# Patient Record
Sex: Male | Born: 1960 | Race: White | Hispanic: No | Marital: Single | Smoking: Never smoker
Health system: Southern US, Community
[De-identification: ages and names within clinical notes are randomized; demographics above are authoritative.]

## PROBLEM LIST (undated history)

## (undated) DIAGNOSIS — K5792 Diverticulitis of intestine, part unspecified, without perforation or abscess without bleeding: Secondary | ICD-10-CM

## (undated) DIAGNOSIS — M5430 Sciatica, unspecified side: Secondary | ICD-10-CM

## (undated) DIAGNOSIS — F319 Bipolar disorder, unspecified: Secondary | ICD-10-CM

## (undated) DIAGNOSIS — F32A Depression, unspecified: Secondary | ICD-10-CM

## (undated) DIAGNOSIS — M549 Dorsalgia, unspecified: Secondary | ICD-10-CM

## (undated) DIAGNOSIS — K219 Gastro-esophageal reflux disease without esophagitis: Secondary | ICD-10-CM

## (undated) DIAGNOSIS — G8929 Other chronic pain: Secondary | ICD-10-CM

## (undated) DIAGNOSIS — F329 Major depressive disorder, single episode, unspecified: Secondary | ICD-10-CM

## (undated) HISTORY — PX: PILONIDAL CYST DRAINAGE: SHX743

## (undated) HISTORY — PX: SHOULDER SURGERY: SHX246

---

## 2014-04-15 ENCOUNTER — Encounter (HOSPITAL_COMMUNITY): Payer: Self-pay | Admitting: Emergency Medicine

## 2014-04-15 ENCOUNTER — Emergency Department (HOSPITAL_COMMUNITY)
Admission: EM | Admit: 2014-04-15 | Discharge: 2014-04-15 | Disposition: A | Discharge status: Home / Self Care | Payer: Self-pay | Attending: Emergency Medicine | Admitting: Emergency Medicine

## 2014-04-15 ENCOUNTER — Emergency Department (HOSPITAL_COMMUNITY): Payer: Self-pay

## 2014-04-15 DIAGNOSIS — M5430 Sciatica, unspecified side: Secondary | ICD-10-CM | POA: Insufficient documentation

## 2014-04-15 DIAGNOSIS — Z79899 Other long term (current) drug therapy: Secondary | ICD-10-CM | POA: Insufficient documentation

## 2014-04-15 DIAGNOSIS — R410 Disorientation, unspecified: Secondary | ICD-10-CM | POA: Insufficient documentation

## 2014-04-15 DIAGNOSIS — Z791 Long term (current) use of non-steroidal anti-inflammatories (NSAID): Secondary | ICD-10-CM | POA: Insufficient documentation

## 2014-04-15 DIAGNOSIS — I10 Essential (primary) hypertension: Secondary | ICD-10-CM | POA: Insufficient documentation

## 2014-04-15 HISTORY — DX: Bipolar disorder, unspecified: F31.9

## 2014-04-15 HISTORY — DX: Depression, unspecified: F32.A

## 2014-04-15 HISTORY — DX: Sciatica, unspecified side: M54.30

## 2014-04-15 HISTORY — DX: Major depressive disorder, single episode, unspecified: F32.9

## 2014-04-15 LAB — CBC WITH DIFFERENTIAL/PLATELET
Basophils Absolute: 0 10*3/uL (ref 0.0–0.1)
Basophils Relative: 0 % (ref 0–1)
Eosinophils Absolute: 0.2 10*3/uL (ref 0.0–0.7)
Eosinophils Relative: 4 % (ref 0–5)
HCT: 41.1 % (ref 39.0–52.0)
Hemoglobin: 13.3 g/dL (ref 13.0–17.0)
Lymphocytes Relative: 31 % (ref 12–46)
Lymphs Abs: 1.7 10*3/uL (ref 0.7–4.0)
MCH: 29.8 pg (ref 26.0–34.0)
MCHC: 32.4 g/dL (ref 30.0–36.0)
MCV: 92.2 fL (ref 78.0–100.0)
Monocytes Absolute: 0.8 10*3/uL (ref 0.1–1.0)
Monocytes Relative: 15 % — ABNORMAL HIGH (ref 3–12)
Neutro Abs: 2.7 10*3/uL (ref 1.7–7.7)
Neutrophils Relative %: 50 % (ref 43–77)
Platelets: 235 10*3/uL (ref 150–400)
RBC: 4.46 MIL/uL (ref 4.22–5.81)
RDW: 13.8 % (ref 11.5–15.5)
WBC: 5.4 10*3/uL (ref 4.0–10.5)

## 2014-04-15 LAB — COMPREHENSIVE METABOLIC PANEL
ALT: 32 U/L (ref 0–53)
AST: 37 U/L (ref 0–37)
Albumin: 3.3 g/dL — ABNORMAL LOW (ref 3.5–5.2)
Alkaline Phosphatase: 58 U/L (ref 39–117)
Anion gap: 6 (ref 5–15)
BUN: 13 mg/dL (ref 6–23)
CO2: 27 mmol/L (ref 19–32)
Calcium: 9.4 mg/dL (ref 8.4–10.5)
Chloride: 106 mEq/L (ref 96–112)
Creatinine, Ser: 1.11 mg/dL (ref 0.50–1.35)
GFR calc Af Amer: 85 mL/min — ABNORMAL LOW (ref >90)
GFR calc non Af Amer: 74 mL/min — ABNORMAL LOW (ref >90)
Glucose, Bld: 79 mg/dL (ref 70–99)
Potassium: 4 mmol/L (ref 3.5–5.1)
Sodium: 139 mmol/L (ref 135–145)
Total Bilirubin: 0.3 mg/dL (ref 0.3–1.2)
Total Protein: 6.5 g/dL (ref 6.0–8.3)

## 2014-04-15 MED ORDER — IBUPROFEN 800 MG PO TABS
800.0000 mg | ORAL_TABLET | Freq: Once | ORAL | Status: AC
  Administered 2014-04-15: 800 mg via ORAL
  Filled 2014-04-15: qty 1

## 2014-04-15 MED ORDER — OMEPRAZOLE 20 MG PO CPDR: 20.0000 mg | DELAYED_RELEASE_CAPSULE | Freq: Every day | ORAL | Status: DC

## 2014-04-15 MED ORDER — NAPROXEN 500 MG PO TABS: 500.0000 mg | ORAL_TABLET | Freq: Two times a day (BID) | ORAL | Status: DC

## 2014-04-15 MED ORDER — LISINOPRIL 5 MG PO TABS: 5.0000 mg | ORAL_TABLET | Freq: Every day | ORAL | Status: DC

## 2014-04-15 NOTE — ED Notes (Signed)
Pt A&OX4, ambulatory at d/c with steady gait, NAD 

## 2014-04-15 NOTE — ED Provider Notes (Signed)
CSN: [id number]     Arrival date & time 04/15/14  1344 History   First MD Initiated Contact with Patient 04/15/14 1714     Chief Complaint  Patient presents with  . Hypertension     (Consider location/radiation/quality/duration/timing/severity/associated sxs/prior Treatment) Patient is a 54 y.o. male presenting with altered mental status. The history is provided by the patient. No language interpreter was used.  Altered Mental Status Presenting symptoms: confusion   Severity:  Moderate Most recent episode:  More than 2 days ago Timing:  Constant Progression:  Worsening Chronicity:  New Context: not taking medications as prescribed   Associated symptoms: no abdominal pain and no vomiting   Pt's Mother reports pt has had episodes of confusion.   Pt is forgetful and has trouble talking sometimes.  She reports pt had an episode 4 days ago of increased confusion.  Pt and Mother are concerned about his blood pressure.  Pt also has a tremor which is worsening.  (pt has had for 20 years) Pt is out of his Medication.   Pt was released from prison in November.   Past Medical History  Diagnosis Date  . Hypertension   . Bipolar disorder   . Depression   . Sciatica    History reviewed. No pertinent past surgical history. History reviewed. No pertinent family history. History  Substance Use Topics  . Smoking status: Never Smoker   . Smokeless tobacco: Not on file  . Alcohol Use: No    Review of Systems  Gastrointestinal: Negative for vomiting and abdominal pain.  Psychiatric/Behavioral: Positive for confusion.  All other systems reviewed and are negative.     Allergies  Review of patient's allergies indicates no known allergies.  Home Medications   Prior to Admission medications   Medication Sig Start Date End Date Taking? Authorizing Provider  amitriptyline (ELAVIL) 100 MG tablet Take 100 mg by mouth at bedtime. 03/24/14  Yes Historical Provider, MD  divalproex (DEPAKOTE ER)  500 MG 24 hr tablet Take 1,500 mg by mouth daily.   Yes Historical Provider, MD  escitalopram (LEXAPRO) 20 MG tablet Take 20 mg by mouth daily.   Yes Historical Provider, MD  lisinopril (PRINIVIL,ZESTRIL) 5 MG tablet Take 5 mg by mouth daily.   Yes Historical Provider, MD  naproxen (NAPROSYN) 500 MG tablet Take 500 mg by mouth 2 (two) times daily with a meal.   Yes Historical Provider, MD  omeprazole (PRILOSEC) 20 MG capsule Take 20 mg by mouth daily.   Yes Historical Provider, MD   BP 131/83 mmHg  Pulse 102  Temp(Src) 97.8 F (36.6 C) (Oral)  Resp 12  SpO2 97% Physical Exam  Constitutional: He is oriented to person, place, and time. He appears well-developed and well-nourished.  HENT:  Head: Normocephalic and atraumatic.  Right Ear: External ear normal.  Left Ear: External ear normal.  Nose: Nose normal.  Mouth/Throat: Oropharynx is clear and moist.  Eyes: EOM are normal. Pupils are equal, round, and reactive to light.  Neck: Normal range of motion.  Cardiovascular: Normal rate and normal heart sounds.   Pulmonary/Chest: Effort normal.  Abdominal: He exhibits no distension.  Musculoskeletal: Normal range of motion.  Neurological: He is alert and oriented to person, place, and time. He has normal reflexes. He displays normal reflexes. No cranial nerve deficit. Coordination normal.  Psychiatric: He has a normal mood and affect.  Nursing note and vitals reviewed.   ED Course  Procedures (including critical care time) Labs Review Labs Reviewed  CBC WITH DIFFERENTIAL - Abnormal; Notable for the following:    Monocytes Relative 15 (*)    All other components within normal limits  COMPREHENSIVE METABOLIC PANEL - Abnormal; Notable for the following:    Albumin 3.3 (*)    GFR calc non Af Amer 74 (*)    GFR calc Af Amer 85 (*)    All other components within normal limits  URINALYSIS, ROUTINE W REFLEX MICROSCOPIC    Imaging Review Ct Head Wo Contrast  04/15/2014   CLINICAL DATA:   Initial evaluation for confusion, history of bipolar disorder  EXAM: CT HEAD WITHOUT CONTRAST  TECHNIQUE: Contiguous axial images were obtained from the base of the skull through the vertex without intravenous contrast.  COMPARISON:  None.  FINDINGS: Moderate diffuse atrophy. Normal attenuation with no evidence of mass hemorrhage infarct or extra-axial fluid. No hydrocephalus. Calvarium intact. No significant inflammatory change in the visualized portions of the sinuses.  IMPRESSION: Atrophy with no acute finding   Electronically Signed   By: Esperanza Heir M.D.   On: 04/15/2014 19:47     EKG Interpretation   Date/Time:  Sunday April 15 2014 18:16:10 EST Ventricular Rate:  95 PR Interval:  156 QRS Duration: 93 QT Interval:  381 QTC Calculation: 479 R Axis:   72 Text Interpretation:  Sinus rhythm Confirmed by ZAVITZ  MD, JOSHUA ([id number])  on 04/15/2014 7:18:29 PM      MDM Labs and ct head are obtained.  I will give rx for pts naprosyn, prilosec and lisinopril.   Pt is given resource guide referrals Pt advised he needs to go to mental health and find a primary care MD   Final diagnoses:  Confusion        Elson Areas, PA-C 04/15/14 2209  Lonia Skinner [place], PA-C 04/15/14 2324  Enid Skeens, MD 04/16/14 0002

## 2014-04-15 NOTE — ED Notes (Signed)
Patient transported to CT 

## 2014-04-15 NOTE — Discharge Instructions (Signed)
Confusion Confusion is the inability to think with your usual speed or clarity. Confusion may come on quickly or slowly over time. How quickly the confusion comes on depends on the cause. Confusion can be due to any number of causes. CAUSES   Concussion, head injury, or head trauma.  Seizures.  Stroke.  Fever.  Brain tumor.  Age related decreased brain function (dementia).  Heightened emotional states like rage or terror.  Mental illness in which the person loses the ability to determine what is real and what is not (hallucinations).  Infections such as a urinary tract infection (UTI).  Toxic effects from alcohol, drugs, or prescription medicines.  Dehydration and an imbalance of salts in the body (electrolytes).  Lack of sleep.  Low blood sugar (diabetes).  Low levels of oxygen from conditions such as chronic lung disorders.  Drug interactions or other medicine side effects.  Nutritional deficiencies, especially niacin, thiamine, vitamin C, or vitamin B.  Sudden drop in body temperature (hypothermia).  Change in routine, such as when traveling or hospitalized. SIGNS AND SYMPTOMS  People often describe their thinking as cloudy or unclear when they are confused. Confusion can also include feeling disoriented. That means you are unaware of where or who you are. You may also not know what the date or time is. If confused, you may also have difficulty paying attention, remembering, and making decisions. Some people also act aggressively when they are confused.  DIAGNOSIS  The medical evaluation of confusion may include:  Blood and urine tests.  X-rays.  Brain and nervous system tests.  Analyzing your brain waves (electroencephalogram or EEG).  Magnetic resonance imaging (MRI) of your head.  Computed tomography (CT) scan of your head.  Mental status tests in which your health care provider may ask many questions. Some of these questions may seem silly or strange,  but they are a very important test to help diagnose and treat confusion. TREATMENT  An admission to the hospital may not be needed, but a person with confusion should not be left alone. Stay with a family member or friend until the confusion clears. Avoid alcohol, pain relievers, or sedative drugs until you have fully recovered. Do not drive until directed by your health care provider. HOME CARE INSTRUCTIONS  What family and friends can do:  To find out if someone is confused, ask the person to state his or her name, age, and the date. If the person is unsure or answers incorrectly, he or she is confused.  Always introduce yourself, no matter how well the person knows you.  Often remind the person of his or her location.  Place a calendar and clock near the confused person.  Help the person with his or her medicines. You may want to use a pill box, an alarm as a reminder, or give the person each dose as prescribed.  Talk about current events and plans for the day.  Try to keep the environment calm, quiet, and peaceful.  Make sure the person keeps follow-up visits with his or her health care provider. PREVENTION  Ways to prevent confusion:  Avoid alcohol.  Eat a balanced diet.  Get enough sleep.  Take medicine only as directed by your health care provider.  Do not become isolated. Spend time with other people and make plans for your days.  Keep careful watch on your blood sugar levels if you are diabetic. SEEK IMMEDIATE MEDICAL CARE IF:   You develop severe headaches, repeated vomiting, seizures, blackouts, or  slurred speech.  There is increasing confusion, weakness, numbness, restlessness, or personality changes.  You develop a loss of balance, have marked dizziness, feel uncoordinated, or fall.  You have delusions, hallucinations, or develop severe anxiety.  Your family members think you need to be rechecked. Document Released: 05/07/2004 Document Revised: 08/14/2013  Document Reviewed: 05/05/2013 [facility] Patient Information 2015 [place], [place]. This information is not intended to replace advice given to you by your health care provider. Make sure you discuss any questions you have with your health care provider. Hypertension Hypertension, commonly called high blood pressure, is when the force of blood pumping through your arteries is too strong. Your arteries are the blood vessels that carry blood from your heart throughout your body. A blood pressure reading consists of a higher number over a lower number, such as 110/72. The higher number (systolic) is the pressure inside your arteries when your heart pumps. The lower number (diastolic) is the pressure inside your arteries when your heart relaxes. Ideally you want your blood pressure below 120/80. Hypertension forces your heart to work harder to pump blood. Your arteries may become narrow or stiff. Having hypertension puts you at risk for heart disease, stroke, and other problems.  RISK FACTORS Some risk factors for high blood pressure are controllable. Others are not.  Risk factors you cannot control include:   Race. You may be at higher risk if you are African American.  Age. Risk increases with age.  Gender. Men are at higher risk than women before age 39 years. After age 29, women are at higher risk than men. Risk factors you can control include:  Not getting enough exercise or physical activity.  Being overweight.  Getting too much fat, sugar, calories, or salt in your diet.  Drinking too much alcohol. SIGNS AND SYMPTOMS Hypertension does not usually cause signs or symptoms. Extremely high blood pressure (hypertensive crisis) may cause headache, anxiety, shortness of breath, and nosebleed. DIAGNOSIS  To check if you have hypertension, your health care provider will measure your blood pressure while you are seated, with your arm held at the level of your heart. It should be measured at least  twice using the same arm. Certain conditions can cause a difference in blood pressure between your right and left arms. A blood pressure reading that is higher than normal on one occasion does not mean that you need treatment. If one blood pressure reading is high, ask your health care provider about having it checked again. TREATMENT  Treating high blood pressure includes making lifestyle changes and possibly taking medicine. Living a healthy lifestyle can help lower high blood pressure. You may need to change some of your habits. Lifestyle changes may include:  Following the DASH diet. This diet is high in fruits, vegetables, and whole grains. It is low in salt, red meat, and added sugars.  Getting at least 2 hours of brisk physical activity every week.  Losing weight if necessary.  Not smoking.  Limiting alcoholic beverages.  Learning ways to reduce stress. If lifestyle changes are not enough to get your blood pressure under control, your health care provider may prescribe medicine. You may need to take more than one. Work closely with your health care provider to understand the risks and benefits. HOME CARE INSTRUCTIONS  Have your blood pressure rechecked as directed by your health care provider.   Take medicines only as directed by your health care provider. Follow the directions carefully. Blood pressure medicines must be taken as prescribed.  The medicine does not work as well when you skip doses. Skipping doses also puts you at risk for problems.   Do not smoke.   Monitor your blood pressure at home as directed by your health care provider. SEEK MEDICAL CARE IF:   You think you are having a reaction to medicines taken.  You have recurrent headaches or feel dizzy.  You have swelling in your ankles.  You have trouble with your vision. SEEK IMMEDIATE MEDICAL CARE IF:  You develop a severe headache or confusion.  You have unusual weakness, numbness, or feel  faint.  You have severe chest or abdominal pain.  You vomit repeatedly.  You have trouble breathing. MAKE SURE YOU:   Understand these instructions.  Will watch your condition.  Will get help right away if you are not doing well or get worse. Document Released: 03/30/2005 Document Revised: 08/14/2013 Document Reviewed: 01/20/2013 [facility] Patient Information 2015 [place], [place]. This information is not intended to replace advice given to you by your health care provider. Make sure you discuss any questions you have with your health care provider.  Emergency Department Resource Guide 1) Find a Doctor and Pay Out of Pocket Although you won't have to find out who is covered by your insurance plan, it is a good idea to ask around and get recommendations. You will then need to call the office and see if the doctor you have chosen will accept you as a new patient and what types of options they offer for patients who are self-pay. Some doctors offer discounts or will set up payment plans for their patients who do not have insurance, but you will need to ask so you aren't surprised when you get to your appointment.  2) Contact Your Local Health Department Not all health departments have doctors that can see patients for sick visits, but many do, so it is worth a call to see if yours does. If you don't know where your local health department is, you can check in your phone book. The CDC also has a tool to help you locate your state's health department, and many state websites also have listings of all of their local health departments.  3) Find a Walk-in Clinic If your illness is not likely to be very severe or complicated, you may want to try a walk in clinic. These are popping up all over the country in pharmacies, drugstores, and shopping centers. They're usually staffed by nurse practitioners or physician assistants that have been trained to treat common illnesses and complaints. They're usually  fairly quick and inexpensive. However, if you have serious medical issues or chronic medical problems, these are probably not your best option.  No Primary Care Doctor: - Call Health Connect at  [phone] - they can help you locate a primary care doctor that  accepts your insurance, provides certain services, etc. - Physician Referral Service- [phone]  Chronic Pain Problems: Organization         Address  Phone   Notes  [facility]  [phone] Patients need to be referred by their primary care doctor.   Medication Assistance: Organization         Address  Phone   Notes  [facility] Medication [facility] [address] [place] [place], [place] [address] [phone] --Must be a resident of [facility] -- Must have NO insurance coverage whatsoever (no Medicaid/ Medicare, etc.) -- The pt. MUST have a primary care doctor that  directs their care regularly and follows them in the community   [facility]  [phone]   [employer]  [phone]    Agencies that provide inexpensive medical care: Organization         Address  Phone   Notes  [facility]  [phone]   [facility]    [phone]   [facility] [address] [place], [place] [address] [phone]   [facility] [address] [place], [place] [phone]   Planned Parenthood    [phone]   [facility]    [phone]   [facility]  [address], [place] Phone:  [phone], Fax:  [phone] Hours of Operation:  9 am - 6 pm, M-F.  Also accepts Medicaid/Medicare and self-pay.  [facility]  [address], [place] Phone: [phone], Fax: [phone]. Hours of Operation:  8:30 am - 5:30 pm, M-F.  Also accepts Medicaid and self-pay.  [facility] [place] [address] [place] Phone: [phone]   [facility] [address] Natasha Bence [place], [place] [phone], Ext. [phone] Mondays & Thursdays: 7-9 AM.  First 15 patients are seen on a first come, first serve basis.    Medicaid-accepting [facility] Providers:  Organization         Address  Phone   Notes  [facility] [address], [place] [phone] Also accepts self-pay patients.  [facility] [address] Laurell Josephs [place], [place]  [phone]   [facility] [address], [place] [phone]   [facility] [address] [place] [phone]   Renaye Rakers [address], [place]   [phone] Only accepts [place] Access [place] patients after they have their name applied to their card.   Self-Pay (no insurance) in [facility]:  Organization         Address  Phone   Notes  Sickle Cell Patients, [facility] Internal Medicine [address] [place], [place] [phone]   [facility] Urgent Care [address] [place], [place] [phone]   [facility]  [address], [place] [phone]   [facility]/Dr. Osei-Bonsu  [address] [place] or [address], [place] [phone] Phone number for both [place] and [place] locations is the same.  [facility] [address] [place] [phone]   [facility] [address], [place] or [address] [phone]   [facility] [place] [facility] [address], [place] [phone], phone; [phone], fax Sees patients 1st and 3rd Saturday of every month.  Must not qualify for public or private insurance (i.e. Medicaid, Medicare, [place] Health Choice, Veterans' Benefits)  Household income should be no more than 200% of the poverty level The clinic cannot treat you if  you are pregnant or think you are pregnant  Sexually transmitted diseases are not treated at the clinic.    Dental Care: Organization         Address  Phone  Notes  [facility] Department of [facility] [address] [place], [place] [phone] Accepts children up  to age 95 who are enrolled in Medicaid or [place] Health Choice; pregnant women with a Medicaid card; and children who have applied for Medicaid or [place] Health Choice, but were declined, whose parents can pay a reduced fee at time of service.  [facility] Department of [facility]  [address] [place] [phone] Accepts children up to age 59 who are enrolled in [place] or [place] Health Choice; pregnant women with a Medicaid card; and children who have applied for Medicaid or [place] Health Choice, but were declined, whose parents can pay a reduced fee at time of service.  [facility]  [address] [place], [place] [phone] Patients are seen by appointment only. Walk-ins are not accepted. [facility] will see patients 62 years of age and older. Monday - Tuesday (8am-5pm) Most Wednesdays (8:30-5pm) $30 per visit, cash only  [facility]  [address], [facility] [phone] Patients are seen by appointment only. Walk-ins are not accepted. [facility] will see patients 64 years of age and older. One Wednesday Evening (Monthly: Volunteer Based).  $30 per visit, cash only  [employer]  [phone] for adults; Children under age 77, call Graduate Pediatric Dentistry at [phone]. Children aged 41-14, please call [phone] to request a pediatric application.  Dental services are provided in all areas of dental care including fillings, crowns and bridges, complete and partial dentures, implants, gum treatment, root canals, and extractions. Preventive care is also provided. Treatment is  provided to both adults and children. Patients are selected via a lottery and there is often a waiting list.   [facility] [address], [place]  [phone] [email or web address]   [facility] [address] [place], [place] [phone], Ext. [phone] Second and Fourth Thursday of each month, opens at 6:30 AM; Clinic ends at 9 AM.  Patients are seen on a first-come first-served basis, and a limited number are seen during each clinic.   [facility]  [address] Ether Griffins [place], [place] [phone]   Eligibility Requirements You must have lived in [place], [place], or [place] counties for at least the last three months.   You cannot be eligible for state or federal sponsored [place], including [employer], [place], or [employer].   You generally cannot be eligible for healthcare insurance through your employer.    How to apply: Eligibility screenings are held every Tuesday and Wednesday afternoon from 1:00 pm until 4:00 pm. You do not need an appointment for the interview!  [facility] [address] [place], [place] [phone]   [facility] Health Department  [phone]   [facility]  [phone]   [facility]  [phone]    Behavioral Health Resources in the Community: Intensive Outpatient Programs Organization         Address  Phone  Notes  [facility] Services [address] [place], [place] [phone]   [facility] Outpatient [address] [place], [place] [phone]   [facility] [address], [place], [place]  [phone]   [facility] Mental Health [address]  [place], [place] [phone] or [phone]   Substance Abuse Resources Organization         Address  Phone  Notes  [facility] Drug Services  [phone]   [facility]  [phone]   [facility]  [place]  [phone]   Floydene Flock  [phone]   Residential & Outpatient Substance Abuse Program  [phone]   Psychological Services Organization         Address  Phone  Notes  [facility]  [phone]   [facility] Services  [phone]   [facility] Mental Health [address] [place] [phone] or [phone]    Mobile Crisis Teams Organization         Address  Phone  Notes  [facility], Mobile Crisis Care Unit  [phone]   [facility]  [address] [place], [place] [phone]   Doristine Locks [address] [place] [phone]    Self-Help/Support Groups Organization         Address  Phone             Notes  Mental Health Assoc. of  - variety of support groups  336- I7437963 Call for more information  Narcotics Anonymous (NA), [facility] [address], [employer] [place]  2 meetings at this location   Statistician         Address  Phone  Notes  [facility] [address],    [place]  [phone]   [facility]  [address] [place] [address], [place], [place] [phone]   [facility] [address] [place], [place] [phone] Admissions: 8am-3pm M-F  [facility] [address]    [place], [place] [phone]   [facility] [address] [place], [place], [place] [phone]   [facility] [address],  [place], [place] [phone]   [facility] - Intensive Outpatient [address] Laurell Joseph[address], [place], [place] [phone]   [facility] (Addiction Recovery [facility] [address] [place]  [place], [place] [phone] or [phone]   [facility] ([facility]) [address] [place] [address], [place], [place] [phone] Accepts Medicaid  [facility] [place] [address],  [place] [phone] Substance Abuse/Addiction Treatment    [facility] Organization         Address  Phone  Notes  [facility]  [phone]   Angie Fava, PhD [address] Ervin Knack [place], [place]   [phone] or [phone]   [facility] Behavioral   [address] [place], [place] [phone]   [facility] [address] [place], [place] [phone] Insurance/Medicaid/sponsorship through [facility] [address]                                    [place], [place] [phone] Therapy/tele-psych/case  [facility] [address], [place] [phone]    Dr. Lolly Mustache  [phone]   [facility] [place]  [employer] [facility] Dept. 1) [address], [place] 2) [address] [place] 3)  [address], [place] [phone]  [phone]   [facility] Child Abuse Hotline 316-347-0503 or [phone] (After Hours)

## 2014-04-15 NOTE — ED Notes (Signed)
Pt c/o htn and some slurred speech Wednesday night with AMS that is now resolved

## 2014-05-08 ENCOUNTER — Emergency Department (HOSPITAL_COMMUNITY): Payer: Self-pay

## 2014-05-08 ENCOUNTER — Ambulatory Visit: Payer: Self-pay

## 2014-05-08 ENCOUNTER — Encounter (HOSPITAL_COMMUNITY): Payer: Self-pay

## 2014-05-08 ENCOUNTER — Emergency Department (HOSPITAL_COMMUNITY)
Admission: EM | Admit: 2014-05-08 | Discharge: 2014-05-08 | Disposition: A | Discharge status: Home / Self Care | Payer: Self-pay | Attending: Emergency Medicine | Admitting: Emergency Medicine

## 2014-05-08 DIAGNOSIS — Z791 Long term (current) use of non-steroidal anti-inflammatories (NSAID): Secondary | ICD-10-CM | POA: Insufficient documentation

## 2014-05-08 DIAGNOSIS — I10 Essential (primary) hypertension: Secondary | ICD-10-CM | POA: Insufficient documentation

## 2014-05-08 DIAGNOSIS — Z8739 Personal history of other diseases of the musculoskeletal system and connective tissue: Secondary | ICD-10-CM | POA: Insufficient documentation

## 2014-05-08 DIAGNOSIS — W19XXXA Unspecified fall, initial encounter: Secondary | ICD-10-CM

## 2014-05-08 DIAGNOSIS — Y9389 Activity, other specified: Secondary | ICD-10-CM | POA: Insufficient documentation

## 2014-05-08 DIAGNOSIS — Y998 Other external cause status: Secondary | ICD-10-CM | POA: Insufficient documentation

## 2014-05-08 DIAGNOSIS — F319 Bipolar disorder, unspecified: Secondary | ICD-10-CM | POA: Insufficient documentation

## 2014-05-08 DIAGNOSIS — W1830XA Fall on same level, unspecified, initial encounter: Secondary | ICD-10-CM | POA: Insufficient documentation

## 2014-05-08 DIAGNOSIS — S3992XA Unspecified injury of lower back, initial encounter: Secondary | ICD-10-CM | POA: Insufficient documentation

## 2014-05-08 DIAGNOSIS — Y9289 Other specified places as the place of occurrence of the external cause: Secondary | ICD-10-CM | POA: Insufficient documentation

## 2014-05-08 DIAGNOSIS — M549 Dorsalgia, unspecified: Secondary | ICD-10-CM

## 2014-05-08 DIAGNOSIS — Z79899 Other long term (current) drug therapy: Secondary | ICD-10-CM | POA: Insufficient documentation

## 2014-05-08 MED ORDER — PROPRANOLOL HCL ER 60 MG PO CP24: 60.0000 mg | ORAL_CAPSULE | Freq: Every day | ORAL | Status: DC

## 2014-05-08 MED ORDER — ESCITALOPRAM OXALATE 20 MG PO TABS: 20.0000 mg | ORAL_TABLET | Freq: Every day | ORAL | Status: DC

## 2014-05-08 MED ORDER — NAPROXEN 500 MG PO TABS: 500.0000 mg | ORAL_TABLET | Freq: Two times a day (BID) | ORAL | Status: DC

## 2014-05-08 MED ORDER — AMITRIPTYLINE HCL 100 MG PO TABS: 100.0000 mg | ORAL_TABLET | Freq: Every day | ORAL | Status: DC

## 2014-05-08 MED ORDER — LISINOPRIL 5 MG PO TABS: 5.0000 mg | ORAL_TABLET | Freq: Every day | ORAL | Status: DC

## 2014-05-08 MED ORDER — DIVALPROEX SODIUM ER 500 MG PO TB24
1500.0000 mg | ORAL_TABLET | Freq: Every day | ORAL | Status: DC
Start: 2014-05-08 — End: 2015-05-30

## 2014-05-08 MED ORDER — PREDNISONE 20 MG PO TABS: 40.0000 mg | ORAL_TABLET | Freq: Every day | ORAL | Status: DC

## 2014-05-08 NOTE — ED Notes (Signed)
Pt reports having back problems x 2 years.  Has been taking Elavil, Naproxen and Propanolol, has been out of meds x 2 1/2 weeks.  Has PCP appt 3-2, is unable to wait that long.  Pain is left lower back radiating down buttock to top of posterior leg.  Pt having difficulties when getting out of bed in the mornings, has to use cane or crutches.  Pt not sleeping at night.

## 2014-05-08 NOTE — Discharge Instructions (Signed)
Back Pain, Adult °Low back pain is very common. About 1 in 5 people have back pain. The cause of low back pain is rarely dangerous. The pain often gets better over time. About half of people with a sudden onset of back pain feel better in just 2 weeks. About 8 in 10 people feel better by 6 weeks.  °CAUSES °Some common causes of back pain include: °· Strain of the muscles or ligaments supporting the spine. °· Wear and tear (degeneration) of the spinal discs. °· Arthritis. °· Direct injury to the back. °DIAGNOSIS °Most of the time, the direct cause of low back pain is not known. However, back pain can be treated effectively even when the exact cause of the pain is unknown. Answering your caregiver's questions about your overall health and symptoms is one of the most accurate ways to make sure the cause of your pain is not dangerous. If your caregiver needs more information, he or she may order lab work or imaging tests (X-rays or MRIs). However, even if imaging tests show changes in your back, this usually does not require surgery. °HOME CARE INSTRUCTIONS °For many people, back pain returns. Since low back pain is rarely dangerous, it is often a condition that people can learn to manage on their own.  °· Remain active. It is stressful on the back to sit or stand in one place. Do not sit, drive, or stand in one place for more than 30 minutes at a time. Take short walks on level surfaces as soon as pain allows. Try to increase the length of time you walk each day. °· Do not stay in bed. Resting more than 1 or 2 days can delay your recovery. °· Do not avoid exercise or work. Your body is made to move. It is not dangerous to be active, even though your back may hurt. Your back will likely heal faster if you return to being active before your pain is gone. °· Pay attention to your body when you  bend and lift. Many people have less discomfort when lifting if they bend their knees, keep the load close to their bodies, and  avoid twisting. Often, the most comfortable positions are those that put less stress on your recovering back. °· Find a comfortable position to sleep. Use a firm mattress and lie on your side with your knees slightly bent. If you lie on your back, put a pillow under your knees. °· Only take over-the-counter or prescription medicines as directed by your caregiver. Over-the-counter medicines to reduce pain and inflammation are often the most helpful. Your caregiver may prescribe muscle relaxant drugs. These medicines help dull your pain so you can more quickly return to your normal activities and healthy exercise. °· Put ice on the injured area. °¨ Put ice in a plastic bag. °¨ Place a towel between your skin and the bag. °¨ Leave the ice on for 15-20 minutes, 03-04 times a day for the first 2 to 3 days. After that, ice and heat may be alternated to reduce pain and spasms. °· Ask your caregiver about trying back exercises and gentle massage. This may be of some benefit. °· Avoid feeling anxious or stressed. Stress increases muscle tension and can worsen back pain. It is important to recognize when you are anxious or stressed and learn ways to manage it. Exercise is a great option. °SEEK MEDICAL CARE IF: °· You have pain that is not relieved with rest or medicine. °· You have pain that does not improve in 1 week. °· You have new symptoms. °· You are generally not feeling well. °SEEK   IMMEDIATE MEDICAL CARE IF:  °· You have pain that radiates from your back into your legs. °· You develop new bowel or bladder control problems. °· You have unusual weakness or numbness in your arms or legs. °· You develop nausea or vomiting. °· You develop abdominal pain. °· You feel faint. °Document Released: 03/30/2005 Document Revised: 09/29/2011 Document Reviewed: 08/01/2013 °Exit[employer]® Patient Information ©2015 [employer]. This information is not intended to replace advice given to you by your health care provider. Make sure you  discuss any questions you have with your health care provider. ° °Medication Refill, Emergency Department °We have refilled your medication today as a courtesy to you. It is best for your medical care, however, to take care of getting refills done through your primary caregiver's office. They have your records and can do a better job of follow-up than we can in the emergency department. °On maintenance medications, we often only prescribe enough medications to get you by until you are able to see your regular caregiver. This is a more expensive way to refill medications. °In the future, please plan for refills so that you will not have to use the emergency department for this. °Thank you for your help. Your help allows us to better take care of the daily emergencies that enter our department. °Document Released: 07/17/2003 Document Revised: 06/22/2011 Document Reviewed: 07/07/2013 °Exit[employer]® Patient Information ©2015 [employer]. This information is not intended to replace advice given to you by your health care provider. Make sure you discuss any questions you have with your health care provider. ° °

## 2014-05-08 NOTE — ED Provider Notes (Signed)
CSN: [id number]     Arrival date & time 05/08/14  1535 History   First MD Initiated Contact with Patient 05/08/14 1612     This chart was scribed for non-physician practitioner, Victor Horseman PA-C working with Victor Sheffield, MD by Arlan Organ, ED Scribe. This patient was seen in room TR10C/TR10C and the patient's care was started at 5:05 PM.   Chief Complaint  Patient presents with  . Back Pain   HPI  HPI Comments: Victor Long is a 54 y.o. male with a PMHx of degenetive disc disease, chronic back pain and sciatica who presents to the Emergency Department complaining of constant, moderate L hip pain that radiates to L mid calf x 1.5 weeks that has progressively worsened. Pain is worsened with abduction of hip. Pt states he sustained a fall 3 days ago landing on his bottock which he feels has exacerbated ongoing chronic back pain. He is able to ambulate but states he "hobbles". However, pt is exacerbated when attempting to ambulate and when sitting properly in a chair. He has tried Naproxen without any improvement for symptoms. Victor Long has recent ran out of prescription pain medications 2 weeks ago. No bowel or bladder incontinence. No known allergies to medications.   Past Medical History  Diagnosis Date  . Hypertension   . Bipolar disorder   . Depression   . Sciatica    History reviewed. No pertinent past surgical history. History reviewed. No pertinent family history. History  Substance Use Topics  . Smoking status: Never Smoker   . Smokeless tobacco: Not on file  . Alcohol Use: No    Review of Systems  Constitutional: Negative for fever and chills.  Respiratory: Negative for shortness of breath.   Cardiovascular: Negative for chest pain.  Gastrointestinal: Negative for nausea, vomiting, diarrhea and constipation.  Genitourinary: Negative for dysuria.  Musculoskeletal: Positive for back pain and arthralgias.  All other systems reviewed and are  negative.     Allergies  Review of patient's allergies indicates no known allergies.  Home Medications   Prior to Admission medications   Medication Sig Start Date End Date Taking? Authorizing Provider  amitriptyline (ELAVIL) 100 MG tablet Take 100 mg by mouth at bedtime. 03/24/14   Historical Provider, MD  divalproex (DEPAKOTE ER) 500 MG 24 hr tablet Take 1,500 mg by mouth daily.    Historical Provider, MD  escitalopram (LEXAPRO) 20 MG tablet Take 20 mg by mouth daily.    Historical Provider, MD  lisinopril (PRINIVIL,ZESTRIL) 5 MG tablet Take 1 tablet (5 mg total) by mouth daily. 04/15/14   Elson Areas, PA-C  naproxen (NAPROSYN) 500 MG tablet Take 1 tablet (500 mg total) by mouth 2 (two) times daily with a meal. 04/15/14   Elson Areas, PA-C  omeprazole (PRILOSEC) 20 MG capsule Take 1 capsule (20 mg total) by mouth daily. 04/15/14   Elson Areas, PA-C   Triage Vitals: BP 140/91 mmHg  Pulse 96  Temp(Src) 98 F (36.7 C) (Oral)  Resp 20  Ht 5' 5.5" (1.664 m)  Wt 192 lb (87.091 kg)  BMI 31.45 kg/m2  SpO2 98%   Physical Exam  Constitutional: He is oriented to person, place, and time. He appears well-developed and well-nourished. No distress.  HENT:  Head: Normocephalic and atraumatic.  Eyes: Conjunctivae and EOM are normal. Right eye exhibits no discharge. Left eye exhibits no discharge. No scleral icterus.  Neck: Normal range of motion. Neck supple. No tracheal deviation present.  Cardiovascular: Normal  rate, regular rhythm and normal heart sounds.  Exam reveals no gallop and no friction rub.   No murmur heard. Pulmonary/Chest: Effort normal and breath sounds normal. No respiratory distress. He has no wheezes.  Abdominal: Soft. He exhibits no distension. There is no tenderness.  Musculoskeletal: Normal range of motion.  Left lumbar paraspinal muscles tender to palpation, no bony tenderness, step-offs, or gross abnormality or deformity of spine, patient is able to ambulate, moves  all extremities  Bilateral great toe extension intact Bilateral plantar/dorsiflexion intact  Neurological: He is alert and oriented to person, place, and time. He has normal reflexes.  Sensation and strength intact bilaterally Symmetrical reflexes  Skin: Skin is warm. He is not diaphoretic.  Psychiatric: He has a normal mood and affect. His behavior is normal. Judgment and thought content normal.  Nursing note and vitals reviewed.   ED Course  Procedures (including critical care time)  DIAGNOSTIC STUDIES: Oxygen Saturation is 98% on RA, Normal by my interpretation.    COORDINATION OF CARE: 5:09 PM- Will order DG sacrum/coccyx and DG hip unilat with pelvis min 4 views L. Discussed treatment plan with pt at bedside and pt agreed to plan.     Labs Review Labs Reviewed - No data to display  Imaging Review No results found.   EKG Interpretation None      MDM   Final diagnoses:  Fall  Back pain, unspecified location    Patient with back pain.  No neurological deficits and normal neuro exam.  Patient is ambulatory.  No loss of bowel or bladder control.  Doubt cauda equina.  Denies fever,  doubt epidural abscess or other lesion. Recommend back exercises, stretching, RICE, and will treat with a short course of prednisone.  Will refill patient's meds as courtesy.  Encouraged the patient that there could be a need for additional workup and/or imaging such as MRI, if the symptoms do not resolve. Patient advised that if the back pain does not resolve, or radiates, this could progress to more serious conditions and is encouraged to follow-up with PCP or orthopedics within 2 weeks.     I personally performed the services described in this documentation, which was scribed in my presence. The recorded information has been reviewed and is accurate.    Victor Horsemanobert Paras Kreider, PA-C 05/08/14 2245  Victor SheffieldForrest Harrison, MD 05/09/14 [phone]

## 2014-05-08 NOTE — ED Notes (Signed)
Patient transported to X-ray 

## 2014-05-17 ENCOUNTER — Encounter (HOSPITAL_COMMUNITY): Payer: Self-pay | Admitting: Emergency Medicine

## 2014-05-17 ENCOUNTER — Emergency Department (INDEPENDENT_AMBULATORY_CARE_PROVIDER_SITE_OTHER)
Admission: EM | Admit: 2014-05-17 | Discharge: 2014-05-17 | Disposition: A | Discharge status: Home / Self Care | Payer: Self-pay | Source: Home / Self Care | Attending: Emergency Medicine | Admitting: Emergency Medicine

## 2014-05-17 DIAGNOSIS — M5442 Lumbago with sciatica, left side: Secondary | ICD-10-CM

## 2014-05-17 MED ORDER — CYCLOBENZAPRINE HCL 10 MG PO TABS: 10.0000 mg | ORAL_TABLET | Freq: Three times a day (TID) | ORAL | Status: DC | PRN

## 2014-05-17 MED ORDER — DICLOFENAC SODIUM 75 MG PO TBEC: 75.0000 mg | DELAYED_RELEASE_TABLET | Freq: Two times a day (BID) | ORAL | Status: DC

## 2014-05-17 MED ORDER — GABAPENTIN 300 MG PO CAPS: 300.0000 mg | ORAL_CAPSULE | Freq: Every day | ORAL | Status: DC

## 2014-05-17 MED ORDER — HYDROCODONE-ACETAMINOPHEN 5-325 MG PO TABS: 1.0000 | ORAL_TABLET | Freq: Four times a day (QID) | ORAL | Status: DC | PRN

## 2014-05-17 NOTE — Discharge Instructions (Signed)
You have sciatica.  This could be coming from your spine or also some muscles in your butt. Stop the naprosyn. Start Diclofenac twice a day. Take flexeril 3 times a day as needed. Take gabapentin 300mg  at bedtime. Use Norco every 6 hours as needed for severe pain. Do gentle back stretching and exercising. Sit with either a tennis ball or golf ball under your left buttock. Follow up as needed.

## 2014-05-17 NOTE — ED Notes (Signed)
C/o  Back pain with severe sciatica pain.  Symptoms present x 1 month.   Was taking naproxen 500 with no relief in symptoms.

## 2014-05-17 NOTE — ED Provider Notes (Signed)
CSN: [id number]     Arrival date & time 05/17/14  1202 History   First MD Initiated Contact with Patient 05/17/14 1227     Chief Complaint  Patient presents with  . Sciatica   (Consider location/radiation/quality/duration/timing/severity/associated sxs/prior Treatment) HPI He is a 54 year old man here for evaluation of left-sided sciatica. He has a history of degenerative disc disease and has some chronic back pain related to this. It is typically well controlled with Naprosyn twice a day. For the last month, his pain has been worsening. He was seen in the emergency room last week and treated with a course of prednisone. He states this did not help at all. The pain starts in his low back and radiates down to his knee. It is a sharp, shooting pain. He denies any numbness, tingling, weakness. He does state moves a certain way, he will feel like the left leg is going to give out on him.  Past Medical History  Diagnosis Date  . Hypertension   . Bipolar disorder   . Depression   . Sciatica    History reviewed. No pertinent past surgical history. History reviewed. No pertinent family history. History  Substance Use Topics  . Smoking status: Never Smoker   . Smokeless tobacco: Not on file  . Alcohol Use: No    Review of Systems As in history of present illness Allergies  Review of patient's allergies indicates no known allergies.  Home Medications   Prior to Admission medications   Medication Sig Start Date End Date Taking? Authorizing Provider  divalproex (DEPAKOTE ER) 500 MG 24 hr tablet Take 3 tablets (1,500 mg total) by mouth daily. 05/08/14  Yes Roxy Horsemanobert Browning, PA-C  escitalopram (LEXAPRO) 20 MG tablet Take 1 tablet (20 mg total) by mouth daily. 05/08/14  Yes Roxy Horsemanobert Browning, PA-C  lisinopril (PRINIVIL,ZESTRIL) 5 MG tablet Take 1 tablet (5 mg total) by mouth daily. 05/08/14  Yes Roxy Horsemanobert Browning, PA-C  omeprazole (PRILOSEC) 20 MG capsule Take 1 capsule (20 mg total) by mouth daily.  04/15/14  Yes Lonia SkinnerLeslie K Sofia, PA-C  amitriptyline (ELAVIL) 100 MG tablet Take 1 tablet (100 mg total) by mouth at bedtime. 05/08/14   Roxy Horsemanobert Browning, PA-C  cyclobenzaprine (FLEXERIL) 10 MG tablet Take 1 tablet (10 mg total) by mouth 3 (three) times daily as needed for muscle spasms. 05/17/14   Charm RingsErin J Bryor Rami, MD  diclofenac (VOLTAREN) 75 MG EC tablet Take 1 tablet (75 mg total) by mouth 2 (two) times daily. 05/17/14   Charm RingsErin J Rimsha Trembley, MD  gabapentin (NEURONTIN) 300 MG capsule Take 1 capsule (300 mg total) by mouth at bedtime. 05/17/14   Charm RingsErin J Izell Labat, MD  HYDROcodone-acetaminophen (NORCO) 5-325 MG per tablet Take 1 tablet by mouth every 6 (six) hours as needed for moderate pain. 05/17/14   Charm RingsErin J Jearld Hemp, MD  propranolol ER (INDERAL LA) 60 MG 24 hr capsule Take 1 capsule (60 mg total) by mouth daily. 05/08/14   Roxy Horsemanobert Browning, PA-C   BP 134/86 mmHg  Pulse 100  Temp(Src) 97.7 F (36.5 C) (Oral)  Resp 18  SpO2 97% Physical Exam  Constitutional: He is oriented to person, place, and time. He appears well-developed and well-nourished. No distress.  Cardiovascular: Normal rate.   Pulmonary/Chest: Effort normal.  Musculoskeletal:  Back: No erythema or edema. No vertebral step-offs. Tender along left lower back and midline. He is also tender over the piriformis. Positive straight leg raise on the left.  Neurological: He is alert and oriented to person, place,  and time.    ED Course  Procedures (including critical care time) Labs Review Labs Reviewed - No data to display  Imaging Review No results found.   MDM   1. Midline low back pain with left-sided sciatica    Will change Naprosyn to diclofenac. Also recommended Flexeril and gabapentin. Norco prescription provided to use as needed for severe pain. Discussed back exercises and stretching. Also discussed that if conservative management does not work, the next step would be seeing an orthopedic for evaluation for possible injection versus surgical  management. Follow-up as needed.    Charm Rings, MD 05/17/14 [phone]

## 2014-06-07 ENCOUNTER — Ambulatory Visit: Payer: Self-pay | Admitting: Family Medicine

## 2014-07-24 ENCOUNTER — Encounter (HOSPITAL_COMMUNITY): Payer: Self-pay | Admitting: *Deleted

## 2014-07-24 ENCOUNTER — Emergency Department (HOSPITAL_COMMUNITY)
Admission: EM | Admit: 2014-07-24 | Discharge: 2014-07-24 | Disposition: A | Discharge status: Home / Self Care | Payer: Self-pay | Attending: Emergency Medicine | Admitting: Emergency Medicine

## 2014-07-24 DIAGNOSIS — I10 Essential (primary) hypertension: Secondary | ICD-10-CM | POA: Insufficient documentation

## 2014-07-24 DIAGNOSIS — Y9389 Activity, other specified: Secondary | ICD-10-CM | POA: Insufficient documentation

## 2014-07-24 DIAGNOSIS — Y998 Other external cause status: Secondary | ICD-10-CM | POA: Insufficient documentation

## 2014-07-24 DIAGNOSIS — S3660XA Unspecified injury of rectum, initial encounter: Secondary | ICD-10-CM | POA: Insufficient documentation

## 2014-07-24 DIAGNOSIS — F319 Bipolar disorder, unspecified: Secondary | ICD-10-CM | POA: Insufficient documentation

## 2014-07-24 DIAGNOSIS — W25XXXA Contact with sharp glass, initial encounter: Secondary | ICD-10-CM | POA: Insufficient documentation

## 2014-07-24 DIAGNOSIS — Z791 Long term (current) use of non-steroidal anti-inflammatories (NSAID): Secondary | ICD-10-CM | POA: Insufficient documentation

## 2014-07-24 DIAGNOSIS — K6289 Other specified diseases of anus and rectum: Secondary | ICD-10-CM

## 2014-07-24 DIAGNOSIS — Y9289 Other specified places as the place of occurrence of the external cause: Secondary | ICD-10-CM | POA: Insufficient documentation

## 2014-07-24 DIAGNOSIS — Z8739 Personal history of other diseases of the musculoskeletal system and connective tissue: Secondary | ICD-10-CM | POA: Insufficient documentation

## 2014-07-24 MED ORDER — PHENYLEPH-SHARK LIV OIL-MO-PET 0.25-3-14-71.9 % RE OINT: 1.0000 "application " | TOPICAL_OINTMENT | Freq: Two times a day (BID) | RECTAL | Status: DC | PRN

## 2014-07-24 NOTE — ED Provider Notes (Signed)
CSN: [id number]     Arrival date & time 07/24/14  1914 History   First MD Initiated Contact with Patient 07/24/14 2056     Chief Complaint  Patient presents with  . Foreign Body in Rectum     (Consider location/radiation/quality/duration/timing/severity/associated sxs/prior Treatment) HPI  This is a 54 yo male with PMH HTN, BPD, presenting today with pain associated with laceration to the perianal area.  This started PTA.  It is persistent, sharp, mild.  No meds taken.  Non-radiating.  He perceives no rectal or abdominal pain at this time.  Negative for nausea, vomiting, dysuria, hematuria.  Positive for minimal bleeding.    Mechanism of injury was placement of the bottom aspect of a thick pint glass into rectum.  Glass broke.  Pt feels as though he has removed all glass, but is concerned that he might have embedded glass into the perianal area.  Past Medical History  Diagnosis Date  . Hypertension   . Bipolar disorder   . Depression   . Sciatica    History reviewed. No pertinent past surgical history. History reviewed. No pertinent family history. History  Substance Use Topics  . Smoking status: Never Smoker   . Smokeless tobacco: Not on file  . Alcohol Use: No    Review of Systems  Constitutional: Negative for fever and chills.  HENT: Negative for facial swelling.   Eyes: Negative for pain and visual disturbance.  Respiratory: Negative for chest tightness and shortness of breath.   Cardiovascular: Negative for chest pain.  Gastrointestinal: Negative for nausea and vomiting.  Genitourinary: Negative for dysuria.  Musculoskeletal: Negative for myalgias and arthralgias.  Skin: Positive for wound.  Neurological: Negative for headaches.  Psychiatric/Behavioral: Negative for behavioral problems.      Allergies  Review of patient's allergies indicates no known allergies.  Home Medications   Prior to Admission medications   Medication Sig Start Date End Date Taking?  Authorizing Provider  amitriptyline (ELAVIL) 100 MG tablet Take 1 tablet (100 mg total) by mouth at bedtime. 05/08/14  Yes Roxy Horseman, PA-C  divalproex (DEPAKOTE ER) 500 MG 24 hr tablet Take 3 tablets (1,500 mg total) by mouth daily. Patient taking differently: Take 500-1,000 mg by mouth daily. Take 500 mg every morning & 1000 mg at bedtime 05/08/14  Yes Roxy Horseman, PA-C  escitalopram (LEXAPRO) 20 MG tablet Take 1 tablet (20 mg total) by mouth daily. 05/08/14  Yes Roxy Horseman, PA-C  gabapentin (NEURONTIN) 300 MG capsule Take 1 capsule (300 mg total) by mouth at bedtime. 05/17/14  Yes Charm Rings, MD  lisinopril (PRINIVIL,ZESTRIL) 5 MG tablet Take 1 tablet (5 mg total) by mouth daily. 05/08/14  Yes Roxy Horseman, PA-C  naproxen (NAPROSYN) 500 MG tablet Take 500 mg by mouth 2 (two) times daily. 05/08/14  Yes Historical Provider, MD  omeprazole (PRILOSEC) 20 MG capsule Take 1 capsule (20 mg total) by mouth daily. 04/15/14  Yes Lonia Skinner Sofia, PA-C  cyclobenzaprine (FLEXERIL) 10 MG tablet Take 1 tablet (10 mg total) by mouth 3 (three) times daily as needed for muscle spasms. Patient not taking: Reported on 07/24/2014 05/17/14   Charm Rings, MD  diclofenac (VOLTAREN) 75 MG EC tablet Take 1 tablet (75 mg total) by mouth 2 (two) times daily. Patient not taking: Reported on 07/24/2014 05/17/14   Charm Rings, MD  HYDROcodone-acetaminophen [facility]) 5-325 MG per tablet Take 1 tablet by mouth every 6 (six) hours as needed for moderate pain. Patient not taking: Reported  on 07/24/2014 05/17/14   Charm RingsErin J Honig, MD  phenylephrine-shark liver oil-mineral oil-petrolatum (PREPARATION H) 0.25-3-14-71.9 % rectal ointment Place 1 application rectally 2 (two) times daily as needed. 07/24/14   Loma BostonStirling Daray Polgar, MD  propranolol ER (INDERAL LA) 60 MG 24 hr capsule Take 1 capsule (60 mg total) by mouth daily. Patient not taking: Reported on 07/24/2014 05/08/14   Roxy Horsemanobert Browning, PA-C   BP 116/72 mmHg  Pulse 71  Temp(Src) 98.3  F (36.8 C) (Oral)  Resp 20  Ht 5\' 5"  (1.651 m)  Wt 197 lb (89.359 kg)  BMI 32.78 kg/m2  SpO2 97% Physical Exam  Constitutional: He is oriented to person, place, and time. He appears well-developed and well-nourished. No distress.  HENT:  Head: Normocephalic and atraumatic.  Mouth/Throat: No oropharyngeal exudate.  Eyes: Conjunctivae are normal. Pupils are equal, round, and reactive to light. No scleral icterus.  Neck: Normal range of motion. No tracheal deviation present. No thyromegaly present.  Cardiovascular: Normal rate, regular rhythm and normal heart sounds.  Exam reveals no gallop and no friction rub.   No murmur heard. Pulmonary/Chest: Effort normal and breath sounds normal. No stridor. No respiratory distress. He has no wheezes. He has no rales. He exhibits no tenderness.  Abdominal: Soft. He exhibits no distension and no mass. There is no tenderness. There is no rebound and no guarding.  Genitourinary: Testes normal and penis normal.  Thorough digital rectal exam reveals absolutely no blood, no traumatic injury, no foreign bodies Anal and perianal exams reveal superficial lacerations at 12:00 and 6:00, without bleeding At about 3:00, there is about a 1 cm laceration which is also hemostatic  Musculoskeletal: Normal range of motion. He exhibits no edema.  Neurological: He is alert and oriented to person, place, and time.  Skin: Skin is warm and dry. He is not diaphoretic.    ED Course  Procedures (including critical care time) Labs Review Labs Reviewed - No data to display  Imaging Review No results found.   EKG Interpretation None      MDM   Final diagnoses:  Pain of perianal area    This is a 54 yo male with PMH HTN, COPD, CKD, presenting today with cough.  This started early last week, at home.  It has been persistent, although it is improving with steroids prescribed in this department a couple of days ago.  He states that he has sharp chest pain when he  coughs.  He does not have this pain when he is not coughing.  He denies diaphoresis, nausea, vomiting, DOE, or LE swelling.  Pain is in the center of the chest, non-radiating and sharp.    Exam reveals absolutely no TTP, rebound, rigidity, or guarding of the abdomen.  Pt is taking PO without complication.  Thorough rectal, anal, and perianal exams are completely WNL with the exception of very minor lacerations.  I do not believe that this presentation is consistent with retained rectal foreign body, perforation, or infection.  I do not believe that imaging is indicated at this time.  Pt stable for discharge, FU.  All questions answered.  Strict return precautions given and the patient expresses understanding.  I have discussed case and care has been guided by my attending physician, Dr. Jodi MourningZavitz.  Loma BostonStirling Amery Vandenbos, MD 07/25/14 [id number]  Blane OharaJoshua Zavitz, MD 07/27/14 1058

## 2014-07-24 NOTE — ED Notes (Signed)
Resident at bedside.  

## 2014-07-24 NOTE — ED Notes (Signed)
Pt states he has a laceration at his rectum from sitting on a piece of glass and is concerned that he has glass in his rectum still, c/o pain to that area, no distress noted

## 2014-07-24 NOTE — Discharge Instructions (Signed)
Anal Fissure, Adult An anal fissure is a small tear or crack in the skin around the anus. Bleeding from a fissure usually stops on its own within a few minutes. However, bleeding will often reoccur with each bowel movement until the crack heals.  CAUSES   Passing large, hard stools.  Frequent diarrheal stools.  Constipation.  Inflammatory bowel disease (Crohn's disease or ulcerative colitis).  Infections.  Anal sex. SYMPTOMS   Small amounts of blood seen on your stools, on toilet paper, or in the toilet after a bowel movement.  Rectal bleeding.  Painful bowel movements.  Itching or irritation around the anus. DIAGNOSIS Your caregiver will examine the anal area. An anal fissure can usually be seen with careful inspection. A rectal exam may be performed and a short tube (anoscope) may be used to examine the anal canal. TREATMENT   You may be instructed to take fiber supplements. These supplements can soften your stool to help make bowel movements easier.  Sitz baths may be recommended to help heal the tear. Do not use soap in the sitz baths.  A medicated cream or ointment may be prescribed to lessen discomfort. HOME CARE INSTRUCTIONS   Maintain a diet high in fruits, whole grains, and vegetables. Avoid constipating foods like bananas and dairy products.  Take sitz baths as directed by your caregiver.  Drink enough fluids to keep your urine clear or pale yellow.  Only take over-the-counter or prescription medicines for pain, discomfort, or fever as directed by your caregiver. Do not take aspirin as this may increase bleeding.  Do not use ointments containing numbing medications (anesthetics) or hydrocortisone. They could slow healing. SEEK MEDICAL CARE IF:   Your fissure is not completely healed within 3 days.  You have further bleeding.  You have a fever.  You have diarrhea mixed with blood.  You have pain.  Your problem is getting worse rather than  better. MAKE SURE YOU:   Understand these instructions.  Will watch your condition.  Will get help right away if you are not doing well or get worse. Document Released: 03/30/2005 Document Revised: 06/22/2011 Document Reviewed: 09/14/2010 Exit[employer] Patient Information 2015 [employer]. This information is not intended to replace advice given to you by your health care provider. Make sure you discuss any questions you have with your health care provider.  

## 2014-11-06 ENCOUNTER — Emergency Department (HOSPITAL_COMMUNITY)
Admission: EM | Admit: 2014-11-06 | Discharge: 2014-11-06 | Disposition: A | Discharge status: Home / Self Care | Payer: Self-pay | Attending: Emergency Medicine | Admitting: Emergency Medicine

## 2014-11-06 ENCOUNTER — Encounter (HOSPITAL_COMMUNITY): Payer: Self-pay | Admitting: Emergency Medicine

## 2014-11-06 DIAGNOSIS — Z791 Long term (current) use of non-steroidal anti-inflammatories (NSAID): Secondary | ICD-10-CM | POA: Insufficient documentation

## 2014-11-06 DIAGNOSIS — M5432 Sciatica, left side: Secondary | ICD-10-CM | POA: Insufficient documentation

## 2014-11-06 DIAGNOSIS — Z79899 Other long term (current) drug therapy: Secondary | ICD-10-CM | POA: Insufficient documentation

## 2014-11-06 DIAGNOSIS — F319 Bipolar disorder, unspecified: Secondary | ICD-10-CM | POA: Insufficient documentation

## 2014-11-06 DIAGNOSIS — G8929 Other chronic pain: Secondary | ICD-10-CM | POA: Insufficient documentation

## 2014-11-06 DIAGNOSIS — I10 Essential (primary) hypertension: Secondary | ICD-10-CM | POA: Insufficient documentation

## 2014-11-06 HISTORY — DX: Dorsalgia, unspecified: M54.9

## 2014-11-06 HISTORY — DX: Other chronic pain: G89.29

## 2014-11-06 MED ORDER — PREDNISONE 10 MG PO TABS: ORAL_TABLET | ORAL | Status: DC

## 2014-11-06 MED ORDER — METHYLPREDNISOLONE SODIUM SUCC 125 MG IJ SOLR
125.0000 mg | Freq: Once | INTRAMUSCULAR | Status: AC
  Administered 2014-11-06: 125 mg via INTRAMUSCULAR
  Filled 2014-11-06: qty 2

## 2014-11-06 MED ORDER — KETOROLAC TROMETHAMINE 60 MG/2ML IM SOLN
60.0000 mg | Freq: Once | INTRAMUSCULAR | Status: AC
  Administered 2014-11-06: 60 mg via INTRAMUSCULAR
  Filled 2014-11-06: qty 2

## 2014-11-06 MED ORDER — HYDROCODONE-ACETAMINOPHEN 5-325 MG PO TABS: 2.0000 | ORAL_TABLET | ORAL | Status: DC | PRN

## 2014-11-06 NOTE — ED Notes (Signed)
Pt. reports chronic low back pain for several years worse this week , denies injury , no urinary discomfort , ambulatory.

## 2014-11-06 NOTE — ED Provider Notes (Signed)
CSN: [id number]     Arrival date & time 11/06/14  2009 History   This chart was scribed for Langston Masker, PA-C working with Mancel Bale, MD by Elveria Rising, ED Scribe. This patient was seen in room TR10C/TR10C and the patient's care was started at 9:15 PM.   Chief Complaint  Patient presents with  . Back Pain   The history is provided by the patient. No language interpreter was used.   HPI Comments: Victor Long is a 54 y.o. male with PMHx of chronic back pain and left sciatica who presents to the Emergency Department complaining of exacerbated sciatic pain this week. Patient reports that he experiences constant pain, with occasional flares. Patient denies new injury or recent falls. Patient is on Naproxen, Elavil and Gabapentin.  Patient reports treatment with prednisone, but his pain returned.   PCP: Dr. Manson Passey   Past Medical History  Diagnosis Date  . Hypertension   . Bipolar disorder   . Depression   . Sciatica   . Back pain, chronic    Past Surgical History  Procedure Laterality Date  . Shoulder surgery    . Back surgery     No family history on file. History  Substance Use Topics  . Smoking status: Never Smoker   . Smokeless tobacco: Not on file  . Alcohol Use: No    Review of Systems  Constitutional: Negative for fever.  Gastrointestinal: Negative for nausea, vomiting and abdominal pain.  Genitourinary: Negative for dysuria and hematuria.  Musculoskeletal: Positive for myalgias and back pain.  All other systems reviewed and are negative.   Allergies  Review of patient's allergies indicates no known allergies.  Home Medications   Prior to Admission medications   Medication Sig Start Date End Date Taking? Authorizing Provider  amitriptyline (ELAVIL) 100 MG tablet Take 1 tablet (100 mg total) by mouth at bedtime. 05/08/14   Roxy Horseman, PA-C  cyclobenzaprine (FLEXERIL) 10 MG tablet Take 1 tablet (10 mg total) by mouth 3 (three) times daily as needed  for muscle spasms. Patient not taking: Reported on 07/24/2014 05/17/14   Charm Rings, MD  diclofenac (VOLTAREN) 75 MG EC tablet Take 1 tablet (75 mg total) by mouth 2 (two) times daily. Patient not taking: Reported on 07/24/2014 05/17/14   Charm Rings, MD  divalproex (DEPAKOTE ER) 500 MG 24 hr tablet Take 3 tablets (1,500 mg total) by mouth daily. Patient taking differently: Take 500-1,000 mg by mouth daily. Take 500 mg every morning & 1000 mg at bedtime 05/08/14   Roxy Horseman, PA-C  escitalopram (LEXAPRO) 20 MG tablet Take 1 tablet (20 mg total) by mouth daily. 05/08/14   Roxy Horseman, PA-C  gabapentin (NEURONTIN) 300 MG capsule Take 1 capsule (300 mg total) by mouth at bedtime. 05/17/14   Charm Rings, MD  HYDROcodone-acetaminophen (NORCO) 5-325 MG per tablet Take 1 tablet by mouth every 6 (six) hours as needed for moderate pain. Patient not taking: Reported on 07/24/2014 05/17/14   Charm Rings, MD  lisinopril (PRINIVIL,ZESTRIL) 5 MG tablet Take 1 tablet (5 mg total) by mouth daily. 05/08/14   Roxy Horseman, PA-C  naproxen (NAPROSYN) 500 MG tablet Take 500 mg by mouth 2 (two) times daily. 05/08/14   Historical Provider, MD  omeprazole (PRILOSEC) 20 MG capsule Take 1 capsule (20 mg total) by mouth daily. 04/15/14   Elson Areas, PA-C  phenylephrine-shark liver oil-mineral oil-petrolatum (PREPARATION H) 0.25-3-14-71.9 % rectal ointment Place 1 application rectally 2 (two) times  daily as needed. 07/24/14   Loma Boston, MD  propranolol ER (INDERAL LA) 60 MG 24 hr capsule Take 1 capsule (60 mg total) by mouth daily. Patient not taking: Reported on 07/24/2014 05/08/14   Roxy Horseman, PA-C   Triage Vitals: BP 110/79 mmHg  Pulse 69  Temp(Src) 97.9 F (36.6 C) (Oral)  Resp 20  Ht 5\' 5"  (1.651 m)  Wt 193 lb (87.544 kg)  BMI 32.12 kg/m2  SpO2 98% Physical Exam  Constitutional: He is oriented to person, place, and time. He appears well-developed and well-nourished. No distress.  HENT:  Head:  Normocephalic and atraumatic.  Eyes: EOM are normal.  Neck: Neck supple. No tracheal deviation present.  Cardiovascular: Normal rate.   Pulmonary/Chest: Effort normal. No respiratory distress.  Musculoskeletal: Normal range of motion.  Diffusely tender lumbar spine. Pain with ROM.   Neurological: He is alert and oriented to person, place, and time.  Skin: Skin is warm and dry.  Psychiatric: He has a normal mood and affect. His behavior is normal.  Nursing note and vitals reviewed.   ED Course   I personally performed the services in this documentation, which was scribed in my presence.  The recorded information has been reviewed and considered.   [facility]. Procedures (including critical care time)  COORDINATION OF CARE: 9:15 PM- Discussed treatment plan with patient at bedside and patient agreed to plan.   Labs Review Labs Reviewed - No data to display  Imaging Review No results found.   EKG Interpretation None      MDM  Pt reports pain much improved with torodol and solumedrol.   Final diagnoses:  Sciatica, left    Meds ordered this encounter  Medications  . methylPREDNISolone sodium succinate (SOLU-MEDROL) 125 mg/2 mL injection 125 mg    Sig:   . ketorolac (TORADOL) injection 60 mg    Sig:   . predniSONE (DELTASONE) 10 MG tablet    Sig: 6,5,4,3,2,1 taper    Dispense:  21 tablet    Refill:  0    Order Specific Question:  Supervising Provider    Answer:  MILLER, BRIAN [[id number]]  . HYDROcodone-acetaminophen (NORCO/VICODIN) 5-325 MG per tablet    Sig: Take 2 tablets by mouth every 4 (four) hours as needed.    Dispense:  16 tablet    Refill:  0    Order Specific Question:  Supervising Provider    Answer:  Eber Hong [[id number]]     Lonia Skinner [place], PA-C 11/07/14 [id number]  Mancel Bale, MD 11/09/14 [phone]

## 2014-11-06 NOTE — Discharge Instructions (Signed)
Sciatica Sciatica is pain, weakness, numbness, or tingling along the path of the sciatic nerve. The nerve starts in the lower back and runs down the back of each leg. The nerve controls the muscles in the lower leg and in the back of the knee, while also providing sensation to the back of the thigh, lower leg, and the sole of your foot. Sciatica is a symptom of another medical condition. For instance, nerve damage or certain conditions, such as a herniated disk or bone spur on the spine, pinch or put pressure on the sciatic nerve. This causes the pain, weakness, or other sensations normally associated with sciatica. Generally, sciatica only affects one side of the body. CAUSES   Herniated or slipped disc.  Degenerative disk disease.  A pain disorder involving the narrow muscle in the buttocks (piriformis syndrome).  Pelvic injury or fracture.  Pregnancy.  Tumor (rare). SYMPTOMS  Symptoms can vary from mild to very severe. The symptoms usually travel from the low back to the buttocks and down the back of the leg. Symptoms can include:  Mild tingling or dull aches in the lower back, leg, or hip.  Numbness in the back of the calf or sole of the foot.  Burning sensations in the lower back, leg, or hip.  Sharp pains in the lower back, leg, or hip.  Leg weakness.  Severe back pain inhibiting movement. These symptoms may get worse with coughing, sneezing, laughing, or prolonged sitting or standing. Also, being overweight may worsen symptoms. DIAGNOSIS  Your caregiver will perform a physical exam to look for common symptoms of sciatica. He or she may ask you to do certain movements or activities that would trigger sciatic nerve pain. Other tests may be performed to find the cause of the sciatica. These may include:  Blood tests.  X-rays.  Imaging tests, such as an MRI or CT scan. TREATMENT  Treatment is directed at the cause of the sciatic pain. Sometimes, treatment is not necessary  and the pain and discomfort goes away on its own. If treatment is needed, your caregiver may suggest:  Over-the-counter medicines to relieve pain.  Prescription medicines, such as anti-inflammatory medicine, muscle relaxants, or narcotics.  Applying heat or ice to the painful area.  Steroid injections to lessen pain, irritation, and inflammation around the nerve.  Reducing activity during periods of pain.  Exercising and stretching to strengthen your abdomen and improve flexibility of your spine. Your caregiver may suggest losing weight if the extra weight makes the back pain worse.  Physical therapy.  Surgery to eliminate what is pressing or pinching the nerve, such as a bone spur or part of a herniated disk. HOME CARE INSTRUCTIONS   Only take over-the-counter or prescription medicines for pain or discomfort as directed by your caregiver.  Apply ice to the affected area for 20 minutes, 3-4 times a day for the first 48-72 hours. Then try heat in the same way.  Exercise, stretch, or perform your usual activities if these do not aggravate your pain.  Attend physical therapy sessions as directed by your caregiver.  Keep all follow-up appointments as directed by your caregiver.  Do not wear high heels or shoes that do not provide proper support.  Check your mattress to see if it is too soft. A firm mattress may lessen your pain and discomfort. SEEK IMMEDIATE MEDICAL CARE IF:   You lose control of your bowel or bladder (incontinence).  You have increasing weakness in the lower back, pelvis, buttocks,   or legs.  You have redness or swelling of your back.  You have a burning sensation when you urinate.  You have pain that gets worse when you lie down or awakens you at night.  Your pain is worse than you have experienced in the past.  Your pain is lasting longer than 4 weeks.  You are suddenly losing weight without reason. MAKE SURE YOU:  Understand these  instructions.  Will watch your condition.  Will get help right away if you are not doing well or get worse. Document Released: 03/24/2001 Document Revised: 09/29/2011 Document Reviewed: 08/09/2011 Exit[employer] Patient Information 2015 [employer]. This information is not intended to replace advice given to you by your health care provider. Make sure you discuss any questions you have with your health care provider.  

## 2014-11-08 ENCOUNTER — Other Ambulatory Visit (HOSPITAL_COMMUNITY): Payer: Self-pay | Admitting: Sports Medicine

## 2014-11-08 DIAGNOSIS — M5440 Lumbago with sciatica, unspecified side: Secondary | ICD-10-CM

## 2014-11-16 ENCOUNTER — Ambulatory Visit (HOSPITAL_COMMUNITY)
Admission: RE | Admit: 2014-11-16 | Discharge: 2014-11-16 | Disposition: A | Discharge status: Home / Self Care | Payer: Self-pay | Source: Ambulatory Visit | Attending: Sports Medicine | Admitting: Sports Medicine

## 2014-11-16 DIAGNOSIS — G548 Other nerve root and plexus disorders: Secondary | ICD-10-CM | POA: Insufficient documentation

## 2014-11-16 DIAGNOSIS — M5440 Lumbago with sciatica, unspecified side: Secondary | ICD-10-CM

## 2014-11-16 DIAGNOSIS — M479 Spondylosis, unspecified: Secondary | ICD-10-CM | POA: Insufficient documentation

## 2015-04-02 ENCOUNTER — Encounter (HOSPITAL_COMMUNITY): Payer: Self-pay | Admitting: Emergency Medicine

## 2015-04-02 ENCOUNTER — Emergency Department (INDEPENDENT_AMBULATORY_CARE_PROVIDER_SITE_OTHER)
Admission: EM | Admit: 2015-04-02 | Discharge: 2015-04-02 | Disposition: A | Discharge status: Home / Self Care | Payer: Self-pay | Source: Home / Self Care

## 2015-04-02 DIAGNOSIS — J069 Acute upper respiratory infection, unspecified: Secondary | ICD-10-CM

## 2015-04-02 MED ORDER — AMOXICILLIN 250 MG PO CAPS: 250.0000 mg | ORAL_CAPSULE | Freq: Two times a day (BID) | ORAL | Status: DC

## 2015-04-02 NOTE — Discharge Instructions (Signed)
Upper Respiratory Infection, Adult Most upper respiratory infections (URIs) are a viral infection of the air passages leading to the lungs. A URI affects the nose, throat, and upper air passages. The most common type of URI is nasopharyngitis and is typically referred to as "the common cold." URIs run their course and usually go away on their own. Most of the time, a URI does not require medical attention, but sometimes a bacterial infection in the upper airways can follow a viral infection. This is called a secondary infection. Sinus and middle ear infections are common types of secondary upper respiratory infections. Bacterial pneumonia can also complicate a URI. A URI can worsen asthma and chronic obstructive pulmonary disease (COPD). Sometimes, these complications can require emergency medical care and may be life threatening.  CAUSES Almost all URIs are caused by viruses. A virus is a type of germ and can spread from one person to another.  RISKS FACTORS You may be at risk for a URI if:   You smoke.   You have chronic heart or lung disease.  You have a weakened defense (immune) system.   You are very young or very old.   You have nasal allergies or asthma.  You work in crowded or poorly ventilated areas.  You work in health care facilities or schools. SIGNS AND SYMPTOMS  Symptoms typically develop 2-3 days after you come in contact with a cold virus. Most viral URIs last 7-10 days. However, viral URIs from the influenza virus (flu virus) can last 14-18 days and are typically more severe. Symptoms may include:   Runny or stuffy (congested) nose.   Sneezing.   Cough.   Sore throat.   Headache.   Fatigue.   Fever.   Loss of appetite.   Pain in your forehead, behind your eyes, and over your cheekbones (sinus pain).  Muscle aches.  DIAGNOSIS  Your health care provider may diagnose a URI by:  Physical exam.  Tests to check that your symptoms are not due to  another condition such as:  Strep throat.  Sinusitis.  Pneumonia.  Asthma. TREATMENT  A URI goes away on its own with time. It cannot be cured with medicines, but medicines may be prescribed or recommended to relieve symptoms. Medicines may help:  Reduce your fever.  Reduce your cough.  Relieve nasal congestion. HOME CARE INSTRUCTIONS   Take medicines only as directed by your health care provider.   Gargle warm saltwater or take cough drops to comfort your throat as directed by your health care provider.  Use a warm mist humidifier or inhale steam from a shower to increase air moisture. This may make it easier to breathe.  Drink enough fluid to keep your urine clear or pale yellow.   Eat soups and other clear broths and maintain good nutrition.   Rest as needed.   Return to work when your temperature has returned to normal or as your health care provider advises. You may need to stay home longer to avoid infecting others. You can also use a face mask and careful hand washing to prevent spread of the virus.  Increase the usage of your inhaler if you have asthma.   Do not use any tobacco products, including cigarettes, chewing tobacco, or electronic cigarettes. If you need help quitting, ask your health care provider. PREVENTION  The best way to protect yourself from getting a cold is to practice good hygiene.   Avoid oral or hand contact with people with cold   symptoms.   Wash your hands often if contact occurs.  There is no clear evidence that vitamin C, vitamin E, echinacea, or exercise reduces the chance of developing a cold. However, it is always recommended to get plenty of rest, exercise, and practice good nutrition.  SEEK MEDICAL CARE IF:   You are getting worse rather than better.   Your symptoms are not controlled by medicine.   You have chills.  You have worsening shortness of breath.  You have brown or red mucus.  You have yellow or brown nasal  discharge.  You have pain in your face, especially when you bend forward.  You have a fever.  You have swollen neck glands.  You have pain while swallowing.  You have white areas in the back of your throat. SEEK IMMEDIATE MEDICAL CARE IF:   You have severe or persistent:  Headache.  Ear pain.  Sinus pain.  Chest pain.  You have chronic lung disease and any of the following:  Wheezing.  Prolonged cough.  Coughing up blood.  A change in your usual mucus.  You have a stiff neck.  You have changes in your:  Vision.  Hearing.  Thinking.  Mood. MAKE SURE YOU:   Understand these instructions.  Will watch your condition.  Will get help right away if you are not doing well or get worse.   This information is not intended to replace advice given to you by your health care provider. Make sure you discuss any questions you have with your health care provider.   Document Released: 09/23/2000 Document Revised: 08/14/2014 Document Reviewed: 07/05/2013 Elsevier Interactive Patient Education 2016 [employer]  

## 2015-04-02 NOTE — ED Provider Notes (Signed)
CSN: [id number]     Arrival date & time 04/02/15  1701 History   None    No chief complaint on file. History obtained from patient:   LOCATION: Upper respiratory SEVERITY: 2 DURATION: Several days CONTEXT: Sudden onset QUALITY: Like previous COLDS MODIFYING FACTORS: Mucinex which is helping. ASSOCIATED SYMPTOMS: Ear pain, sore throat, feeling tired TIMING: Constant   (Consider location/radiation/quality/duration/timing/severity/associated sxs/prior Treatment) HPI  Past Medical History  Diagnosis Date  . Hypertension   . Bipolar disorder   . Depression   . Sciatica   . Back pain, chronic    Past Surgical History  Procedure Laterality Date  . Shoulder surgery    . Back surgery     No family history on file. Social History  Substance Use Topics  . Smoking status: Never Smoker   . Smokeless tobacco: Not on file  . Alcohol Use: No    Review of Systems ROS +'ve URI sxs  Denies: HEADACHE, NAUSEA, ABDOMINAL PAIN, CHEST PAIN, CONGESTION, DYSURIA, SHORTNESS OF BREATH  Allergies  Review of patient's allergies indicates no known allergies.  Home Medications   Prior to Admission medications   Medication Sig Start Date End Date Taking? Authorizing Provider  amitriptyline (ELAVIL) 100 MG tablet Take 1 tablet (100 mg total) by mouth at bedtime. 05/08/14   Roxy Horseman, PA-C  cyclobenzaprine (FLEXERIL) 10 MG tablet Take 1 tablet (10 mg total) by mouth 3 (three) times daily as needed for muscle spasms. Patient not taking: Reported on 07/24/2014 05/17/14   Charm Rings, MD  diclofenac (VOLTAREN) 75 MG EC tablet Take 1 tablet (75 mg total) by mouth 2 (two) times daily. Patient not taking: Reported on 07/24/2014 05/17/14   Charm Rings, MD  divalproex (DEPAKOTE ER) 500 MG 24 hr tablet Take 3 tablets (1,500 mg total) by mouth daily. Patient taking differently: Take 500-1,000 mg by mouth daily. Take 500 mg every morning & 1000 mg at bedtime 05/08/14   Roxy Horseman, PA-C   escitalopram (LEXAPRO) 20 MG tablet Take 1 tablet (20 mg total) by mouth daily. 05/08/14   Roxy Horseman, PA-C  gabapentin (NEURONTIN) 300 MG capsule Take 1 capsule (300 mg total) by mouth at bedtime. 05/17/14   Charm Rings, MD  HYDROcodone-acetaminophen (NORCO/VICODIN) 5-325 MG per tablet Take 2 tablets by mouth every 4 (four) hours as needed. 11/06/14   Elson Areas, PA-C  lisinopril (PRINIVIL,ZESTRIL) 5 MG tablet Take 1 tablet (5 mg total) by mouth daily. 05/08/14   Roxy Horseman, PA-C  naproxen (NAPROSYN) 500 MG tablet Take 500 mg by mouth 2 (two) times daily. 05/08/14   Historical Provider, MD  omeprazole (PRILOSEC) 20 MG capsule Take 1 capsule (20 mg total) by mouth daily. 04/15/14   Elson Areas, PA-C  phenylephrine-shark liver oil-mineral oil-petrolatum (PREPARATION H) 0.25-3-14-71.9 % rectal ointment Place 1 application rectally 2 (two) times daily as needed. 07/24/14   Loma Boston, MD  predniSONE (DELTASONE) 10 MG tablet 6,5,4,3,2,1 taper 11/06/14   Elson Areas, PA-C  propranolol ER (INDERAL LA) 60 MG 24 hr capsule Take 1 capsule (60 mg total) by mouth daily. Patient not taking: Reported on 07/24/2014 05/08/14   Roxy Horseman, PA-C   Meds Ordered and Administered this Visit  Medications - No data to display  There were no vitals taken for this visit. No data found.   Physical Exam  Constitutional: He is oriented to person, place, and time. He appears well-developed and well-nourished. No distress.  HENT:  Head: Normocephalic and  atraumatic.  Right Ear: External ear normal.  Left Ear: External ear normal.  Nose: Nose normal.  Mouth/Throat: Oropharynx is clear and moist.  Eyes: Conjunctivae are normal.  Pulmonary/Chest: Effort normal and breath sounds normal.  Abdominal: Soft.  Musculoskeletal: Normal range of motion.  Neurological: He is alert and oriented to person, place, and time.  Skin: Skin is warm and dry.  Psychiatric: He has a normal mood and affect. His  behavior is normal. Judgment and thought content normal.    ED Course  Procedures (including critical care time)  Labs Review Labs Reviewed - No data to display  Imaging Review No results found.   Visual Acuity Review  Right Eye Distance:   Left Eye Distance:   Bilateral Distance:    Right Eye Near:   Left Eye Near:    Bilateral Near:         MDM   1. URI (upper respiratory infection)     Amoxicillin provided to the patient. He was also seen in the use of Mucinex and Wellbutrin. I advised patient that Wellbutrin is an SSRI. Patient is advised to continue home symptomatic treatment. Prescription for amoxicillin is provided to the patient. Patient is advised that if there are new or worsening symptoms or attend the emergency department, or contact primary care provider. Instructions of care provided discharged home in stable condition. Return to work note provided.  THIS NOTE WAS GENERATED USING A VOICE RECOGNITION SOFTWARE PROGRAM. ALL REASONABLE EFFORTS  WERE MADE TO PROOFREAD THIS DOCUMENT FOR ACCURACY.     Tharon AquasFrank C Catheleen Langhorne, PA 04/02/15 [phone]

## 2015-04-02 NOTE — ED Notes (Signed)
C/o cold sx onset Saturday morning Sx include: HA, runny nose, watery eyes, feeling "off balance", fevers, SOB, fatigue Taking mucinex w/no relief A&O x4... No acute distress.

## 2015-05-30 ENCOUNTER — Emergency Department (INDEPENDENT_AMBULATORY_CARE_PROVIDER_SITE_OTHER)
Admission: EM | Admit: 2015-05-30 | Discharge: 2015-05-30 | Disposition: A | Discharge status: Home / Self Care | Payer: PRIVATE HEALTH INSURANCE | Source: Home / Self Care | Attending: Family Medicine | Admitting: Family Medicine

## 2015-05-30 ENCOUNTER — Encounter (HOSPITAL_COMMUNITY): Payer: Self-pay | Admitting: Emergency Medicine

## 2015-05-30 DIAGNOSIS — T148 Other injury of unspecified body region: Secondary | ICD-10-CM

## 2015-05-30 DIAGNOSIS — M542 Cervicalgia: Secondary | ICD-10-CM

## 2015-05-30 DIAGNOSIS — M609 Myositis, unspecified: Secondary | ICD-10-CM | POA: Diagnosis not present

## 2015-05-30 DIAGNOSIS — T148XXA Other injury of unspecified body region, initial encounter: Secondary | ICD-10-CM

## 2015-05-30 MED ORDER — DICLOFENAC POTASSIUM 50 MG PO TABS: 50.0000 mg | ORAL_TABLET | Freq: Three times a day (TID) | ORAL | Status: DC

## 2015-05-30 MED ORDER — DICLOFENAC SODIUM 1 % TD GEL: 1.0000 "application " | Freq: Four times a day (QID) | TRANSDERMAL | Status: DC

## 2015-05-30 MED ORDER — TRAMADOL HCL 50 MG PO TABS: ORAL_TABLET | ORAL | Status: DC

## 2015-05-30 NOTE — ED Provider Notes (Signed)
CSN: [id number]     Arrival date & time 05/30/15  1303 History   First MD Initiated Contact with Patient 05/30/15 1353     Chief Complaint  Patient presents with  . Neck Pain   (Consider location/radiation/quality/duration/timing/severity/associated sxs/prior Treatment) HPI Comments: 55 year old male complaining of a 2-1/2 month history of pain along the posterior neck, between the shoulder blades, along the top of the shoulders/trapezius muscles and deltoid. He has a history of chronic back pain but this is primarily lower discogenic pain associated with sciatica. This episode for which she presents today is in the neck and unrelated to the chronic lower back pain. His job requires where he performs repetitive movement, his head is down for prolonged periods of time he has to reach up and down and he can realize the movements that seemed to exacerbate the pain. Denies focal paresthesias or weakness. No known trauma.   Past Medical History  Diagnosis Date  . Hypertension   . Bipolar disorder (HCC)   . Depression   . Sciatica   . Back pain, chronic    Past Surgical History  Procedure Laterality Date  . Shoulder surgery    . Back surgery     No family history on file. Social History  Substance Use Topics  . Smoking status: Never Smoker   . Smokeless tobacco: None  . Alcohol Use: No    Review of Systems  Constitutional: Positive for activity change. Negative for fever and fatigue.  HENT: Negative.   Respiratory: Negative.   Cardiovascular: Negative.   Genitourinary: Negative.   Musculoskeletal: Positive for myalgias and neck pain. Negative for neck stiffness.  Skin: Negative.   Neurological: Negative.     Allergies  Review of patient's allergies indicates no known allergies.  Home Medications   Prior to Admission medications   Medication Sig Start Date End Date Taking? Authorizing Provider  amitriptyline (ELAVIL) 100 MG tablet Take 1 tablet (100 mg total) by mouth at  bedtime. 05/08/14   Roxy Horseman, PA-C  amoxicillin (AMOXIL) 250 MG capsule Take 1 capsule (250 mg total) by mouth 2 (two) times daily. 04/02/15   Tharon Aquas, PA  diclofenac (CATAFLAM) 50 MG tablet Take 1 tablet (50 mg total) by mouth 3 (three) times daily. One tablet TID with food prn pain. 05/30/15   Hayden Rasmussen, NP  diclofenac sodium (VOLTAREN) 1 % GEL Apply 1 application topically 4 (four) times daily. 05/30/15   Hayden Rasmussen, NP  escitalopram (LEXAPRO) 20 MG tablet Take 1 tablet (20 mg total) by mouth daily. 05/08/14   Roxy Horseman, PA-C  gabapentin (NEURONTIN) 300 MG capsule Take 1 capsule (300 mg total) by mouth at bedtime. 05/17/14   Charm Rings, MD  HYDROcodone-acetaminophen (NORCO/VICODIN) 5-325 MG per tablet Take 2 tablets by mouth every 4 (four) hours as needed. 11/06/14   Elson Areas, PA-C  lisinopril (PRINIVIL,ZESTRIL) 5 MG tablet Take 1 tablet (5 mg total) by mouth daily. 05/08/14   Roxy Horseman, PA-C  naproxen (NAPROSYN) 500 MG tablet Take 500 mg by mouth 2 (two) times daily. 05/08/14   Historical Provider, MD  omeprazole (PRILOSEC) 20 MG capsule Take 1 capsule (20 mg total) by mouth daily. 04/15/14   Elson Areas, PA-C  phenylephrine-shark liver oil-mineral oil-petrolatum (PREPARATION H) 0.25-3-14-71.9 % rectal ointment Place 1 application rectally 2 (two) times daily as needed. 07/24/14   Loma Boston, MD  predniSONE (DELTASONE) 10 MG tablet 6,5,4,3,2,1 taper 11/06/14   Elson Areas, PA-C  traMADol (  ULTRAM) 50 MG tablet 1-2 tabs po q 6 hr prn pain Maximum dose= 8 tablets per day 05/30/15   Hayden Rasmussen, NP   Meds Ordered and Administered this Visit  Medications - No data to display  BP 129/80 mmHg  Pulse 91  Temp(Src) 97.2 F (36.2 C) (Oral)  Resp 20  SpO2 97% No data found.   Physical Exam  Constitutional: He is oriented to person, place, and time. He appears well-developed and well-nourished. No distress.  HENT:  Head: Normocephalic and atraumatic.  Eyes: EOM  are normal. Left eye exhibits no discharge.  Neck: Normal range of motion. Neck supple.  Cardiovascular: Normal rate.   Pulmonary/Chest: Effort normal. No respiratory distress.  Musculoskeletal:  Tenderness to the bilateral splenius capitis time muscles from the point of contact of the occipital condyles to the upper mid back. Tenderness over the bilateral rhomboid and medial latissimus dorsi musculature. There is tenderness along the ridge of the bilateral trapezii as well as the supraspinatus muscles. Lesser tenderness to the deltoid. Distal neurovascular motor sensory is grossly intact. Radial pulses 2+. No spinal tenderness. No step-off deformity, swelling or discoloration.  Neurological: He is alert and oriented to person, place, and time. No cranial nerve deficit.  Skin: Skin is warm and dry.  Psychiatric: He has a normal mood and affect.  Nursing note and vitals reviewed.   ED Course  Procedures (including critical care time)  Labs Review Labs Reviewed - No data to display  Imaging Review No results found.   Visual Acuity Review  Right Eye Distance:   Left Eye Distance:   Bilateral Distance:    Right Eye Near:   Left Eye Near:    Bilateral Near:         MDM   1. Myofasciitis   2. Muscle strain   3. Muscle pain, cervical    Apply Voltaren gel to the areas of pain 4 times a day. In addition he may also apply a menthol patch such as Salonpas or other menthol-containing patches. Apply heat to these areas for most of the day. He may use the care wraps which last for up to 8 hours and/or heating pad at home. After the use of heat perform minor stretches. Avoid those movements, postures and positions that exacerbate the pain.     Hayden Rasmussen, NP 05/30/15 [phone]

## 2015-05-30 NOTE — ED Notes (Signed)
Pain and stiffness in neck and upper back. Pain in upper back, neck, to back of head and bilaterally to both shoulders.  No known injury.  No history of this type of pain.  Reports ddd of lower back and sciatica.

## 2015-05-30 NOTE — Discharge Instructions (Signed)
Myofascial Pain Syndrome and Fibromyalgia Apply Voltaren gel to the areas of pain 4 times a day. In addition he may also apply a menthol patch such as Salonpas or other menthol-containing patches. Apply heat to these areas for most of the day. He may use the care wraps which last for up to 8 hours and/or heating pad at home. After the use of heat perform minor stretches. Avoid those movements, postures and positions that exacerbate the pain. Myofascial pain syndrome and fibromyalgia are both pain disorders. This pain may be felt mainly in your muscles.   Myofascial pain syndrome:  Always has trigger points or tender points in the muscle that will cause pain when pressed. The pain may come and go.  Usually affects your neck, upper back, and shoulder areas. The pain often radiates into your arms and hands.  Fibromyalgia:  Has muscle pains and tenderness that come and go.  Is often associated with fatigue and sleep disturbances.  Has trigger points.  Tends to be long-lasting (chronic), but is not life-threatening. Fibromyalgia and myofascial pain are not the same. However, they often occur together. If you have both conditions, each can make the other worse. Both are common and can cause enough pain and fatigue to make day-to-day activities difficult.  CAUSES  The exact causes of fibromyalgia and myofascial pain are not known. People with certain gene types may be more likely to develop fibromyalgia. Some factors can be triggers for both conditions, such as:   Spine disorders.  Arthritis.  Severe injury (trauma) and other physical stressors.  Being under a lot of stress.  A medical illness. SIGNS AND SYMPTOMS  Fibromyalgia The main symptom of fibromyalgia is widespread pain and tenderness in your muscles. This can vary over time. Pain is sometimes described as stabbing, shooting, or burning. You may have tingling or numbness, too. You may also have sleep problems and fatigue. You  may wake up feeling tired and groggy (fibro fog). Other symptoms may include:   Bowel and bladder problems.  Headaches.  Visual problems.  Problems with odors and noises.  Depression or mood changes.  Painful menstrual periods (dysmenorrhea).  Dry skin or eyes. Myofascial pain syndrome Symptoms of myofascial pain syndrome include:   Tight, ropy bands of muscle.   Uncomfortable sensations in muscular areas, such as:  Aching.  Cramping.  Burning.  Numbness.  Tingling.   Muscle weakness.  Trouble moving certain muscles freely (range of motion). DIAGNOSIS  There are no specific tests to diagnose fibromyalgia or myofascial pain syndrome. Both can be hard to diagnose because their symptoms are common in many other conditions. Your health care provider may suspect one or both of these conditions based on your symptoms and medical history. Your health care provider will also do a physical exam.  The key to diagnosing fibromyalgia is having pain, fatigue, and other symptoms for more than three months that cannot be explained by another condition.  The key to diagnosing myofascial pain syndrome is finding trigger points in muscles that are tender and cause pain elsewhere in your body (referred pain). TREATMENT  Treating fibromyalgia and myofascial pain often requires a team of health care providers. This usually starts with your primary provider and a physical therapist. You may also find it helpful to work with alternative health care providers, such as massage therapists or acupuncturists. Treatment for fibromyalgia may include medicines. This may include nonsteroidal anti-inflammatory drugs (NSAIDs), along with other medicines.  Treatment for myofascial pain may also include:  NSAIDs.  Cooling and stretching of muscles.  Trigger point injections.  Sound wave (ultrasound) treatments to stimulate muscles. HOME CARE INSTRUCTIONS   Take medicines only as directed by your  health care provider.  Exercise as directed by your health care provider or physical therapist.  Try to avoid stressful situations.  Practice relaxation techniques to control your stress. You may want to try:  Biofeedback.  Visual imagery.  Hypnosis.  Muscle relaxation.  Yoga.  Meditation.  Talk to your health care provider about alternative treatments, such as acupuncture or massage treatment.  Maintain a healthy lifestyle. This includes eating a healthy diet and getting enough sleep.  Consider joining a support group.  Do not do activities that stress or strain your muscles. That includes repetitive motions and heavy lifting. SEEK MEDICAL CARE IF:   You have new symptoms.  Your symptoms get worse.  You have side effects from your medicines.  You have trouble sleeping.  Your condition is causing depression or anxiety. FOR MORE INFORMATION   National Fibromyalgia Association: http://www.fmaware.orgwww.fmaware.org  Arthritis Foundation: http://www.arthritis.orgwww.arthritis.org  American Chronic Pain Association: michaeledo.com.CandyDash.co.za   This information is not intended to replace advice given to you by your health care provider. Make sure you discuss any questions you have with your health care provider.   Document Released: 03/30/2005 Document Revised: 04/20/2014 Document Reviewed: 01/03/2014 Elsevier Interactive Patient Education [employer]

## 2015-06-18 ENCOUNTER — Encounter (HOSPITAL_COMMUNITY): Payer: Self-pay | Admitting: Emergency Medicine

## 2015-06-18 DIAGNOSIS — Z792 Long term (current) use of antibiotics: Secondary | ICD-10-CM | POA: Diagnosis not present

## 2015-06-18 DIAGNOSIS — Y9389 Activity, other specified: Secondary | ICD-10-CM | POA: Diagnosis not present

## 2015-06-18 DIAGNOSIS — Z79899 Other long term (current) drug therapy: Secondary | ICD-10-CM | POA: Diagnosis not present

## 2015-06-18 DIAGNOSIS — G8929 Other chronic pain: Secondary | ICD-10-CM | POA: Insufficient documentation

## 2015-06-18 DIAGNOSIS — Z791 Long term (current) use of non-steroidal anti-inflammatories (NSAID): Secondary | ICD-10-CM | POA: Insufficient documentation

## 2015-06-18 DIAGNOSIS — I10 Essential (primary) hypertension: Secondary | ICD-10-CM | POA: Insufficient documentation

## 2015-06-18 DIAGNOSIS — Z7952 Long term (current) use of systemic steroids: Secondary | ICD-10-CM | POA: Insufficient documentation

## 2015-06-18 DIAGNOSIS — Y999 Unspecified external cause status: Secondary | ICD-10-CM | POA: Diagnosis not present

## 2015-06-18 DIAGNOSIS — F319 Bipolar disorder, unspecified: Secondary | ICD-10-CM | POA: Diagnosis not present

## 2015-06-18 DIAGNOSIS — Y9289 Other specified places as the place of occurrence of the external cause: Secondary | ICD-10-CM | POA: Insufficient documentation

## 2015-06-18 DIAGNOSIS — X58XXXA Exposure to other specified factors, initial encounter: Secondary | ICD-10-CM | POA: Insufficient documentation

## 2015-06-18 DIAGNOSIS — M5416 Radiculopathy, lumbar region: Secondary | ICD-10-CM | POA: Diagnosis not present

## 2015-06-18 DIAGNOSIS — S3992XA Unspecified injury of lower back, initial encounter: Secondary | ICD-10-CM | POA: Diagnosis present

## 2015-06-18 NOTE — ED Notes (Signed)
Pt. reports chronic low back pain ( sciatica)  For 3 years worse this evening when he turned " I felt a pop" , ambulatory , denies numbness or urinary incontinence.

## 2015-06-19 ENCOUNTER — Emergency Department (HOSPITAL_COMMUNITY)
Admission: EM | Admit: 2015-06-19 | Discharge: 2015-06-19 | Disposition: A | Discharge status: Home / Self Care | Payer: PRIVATE HEALTH INSURANCE | Attending: Emergency Medicine | Admitting: Emergency Medicine

## 2015-06-19 ENCOUNTER — Emergency Department (HOSPITAL_COMMUNITY): Payer: PRIVATE HEALTH INSURANCE

## 2015-06-19 DIAGNOSIS — M5416 Radiculopathy, lumbar region: Secondary | ICD-10-CM

## 2015-06-19 MED ORDER — KETOROLAC TROMETHAMINE 60 MG/2ML IM SOLN
30.0000 mg | Freq: Once | INTRAMUSCULAR | Status: AC
  Administered 2015-06-19: 30 mg via INTRAMUSCULAR
  Filled 2015-06-19: qty 2

## 2015-06-19 MED ORDER — HYDROCODONE-ACETAMINOPHEN 5-325 MG PO TABS: ORAL_TABLET | ORAL | Status: DC

## 2015-06-19 NOTE — ED Provider Notes (Signed)
CSN: [id number]     Arrival date & time 06/18/15  2317 History   First MD Initiated Contact with Patient 06/19/15 [phone]     Chief Complaint  Patient presents with  . Sciatica     (Consider location/radiation/quality/duration/timing/severity/associated sxs/prior Treatment) HPI  Blood pressure 132/85, pulse 88, temperature 98.7 F (37.1 C), temperature source Oral, resp. rate 18, height 5\' 5"  (1.651 m), weight 78.472 kg, SpO2 99 %.  Victor Long is a 55 y.o. male with past medical history significant for hypertension, bipolar and chronic back pain complaining of severe back pain after patient returned yesterday felt a pop in the low back and severe pain radiating down the left leg, states the pain was so severe he might have fallen except she was able to steady himself with his hands against a machine. Pain eased but is still severe, he hasn't taken any pain medication at home. States his primary care physician has been trying to get him into pain management but hasn't been able to make the appointment. Denies fever, chills, change in bowel or bladder habits, h/o IDVU or cancer, numbness or weakness.   Past Medical History  Diagnosis Date  . Hypertension   . Bipolar disorder (HCC)   . Depression   . Sciatica   . Back pain, chronic    Past Surgical History  Procedure Laterality Date  . Shoulder surgery    . Back surgery     No family history on file. Social History  Substance Use Topics  . Smoking status: Never Smoker   . Smokeless tobacco: None  . Alcohol Use: No    Review of Systems  10 systems reviewed and found to be negative, except as noted in the HPI.   Allergies  Review of patient's allergies indicates no known allergies.  Home Medications   Prior to Admission medications   Medication Sig Start Date End Date Taking? Authorizing Provider  amitriptyline (ELAVIL) 100 MG tablet Take 1 tablet (100 mg total) by mouth at bedtime. 05/08/14   Roxy Horsemanobert Browning, PA-C    amoxicillin (AMOXIL) 250 MG capsule Take 1 capsule (250 mg total) by mouth 2 (two) times daily. 04/02/15   Tharon AquasFrank C Patrick, PA  diclofenac sodium (VOLTAREN) 1 % GEL Apply 1 application topically 4 (four) times daily. 05/30/15   Hayden Rasmussenavid Mabe, NP  escitalopram (LEXAPRO) 20 MG tablet Take 1 tablet (20 mg total) by mouth daily. 05/08/14   Roxy Horsemanobert Browning, PA-C  gabapentin (NEURONTIN) 300 MG capsule Take 1 capsule (300 mg total) by mouth at bedtime. 05/17/14   Charm RingsErin J Honig, MD  HYDROcodone-acetaminophen (NORCO/VICODIN) 5-325 MG per tablet Take 2 tablets by mouth every 4 (four) hours as needed. 11/06/14   Elson AreasLeslie K Sofia, PA-C  lisinopril (PRINIVIL,ZESTRIL) 5 MG tablet Take 1 tablet (5 mg total) by mouth daily. 05/08/14   Roxy Horsemanobert Browning, PA-C  naproxen (NAPROSYN) 500 MG tablet Take 500 mg by mouth 2 (two) times daily. 05/08/14   Historical Provider, MD  omeprazole (PRILOSEC) 20 MG capsule Take 1 capsule (20 mg total) by mouth daily. 04/15/14   Elson AreasLeslie K Sofia, PA-C  phenylephrine-shark liver oil-mineral oil-petrolatum (PREPARATION H) 0.25-3-14-71.9 % rectal ointment Place 1 application rectally 2 (two) times daily as needed. 07/24/14   Loma BostonStirling Harper, MD  predniSONE (DELTASONE) 10 MG tablet 6,5,4,3,2,1 taper 11/06/14   Elson AreasLeslie K Sofia, PA-C  traMADol (ULTRAM) 50 MG tablet 1-2 tabs po q 6 hr prn pain Maximum dose= 8 tablets per day 05/30/15   Hayden Rasmussenavid Mabe, NP  BP 132/85 mmHg  Pulse 88  Temp(Src) 98.7 F (37.1 C) (Oral)  Resp 18  Ht  (1.651 m)  Wt 78.472 kg  BMI 28.79 kg/m2  SpO2 99% Physical Exam  Constitutional: He is oriented to person, place, and time. He appears well-developed and well-nourished. No distress.  HENT:  Head: Normocephalic.  Eyes: Conjunctivae and EOM are normal.  Neck: Normal range of motion.  Cardiovascular: Normal rate, regular rhythm and intact distal pulses.   Pulmonary/Chest: Effort normal. No stridor.  Abdominal: Soft. There is no tenderness.  Musculoskeletal: Normal range of  motion. He exhibits tenderness.       Back:  Neurological: He is alert and oriented to person, place, and time.  No point tenderness to percussion of lumbar spinal processes.  No TTP or paraspinal muscular spasm. Strength is 5 out of 5 to bilateral lower extremities at hip and knee; extensor hallucis longus 5 out of 5. Ankle strength 5 out of 5, no clonus, neurovascularly intact. No saddle anaesthesia. Patellar reflexes are 2+ bilaterally.    Ambulates with a coordinated in nonantalgic gait  Psychiatric: He has a normal mood and affect.  Nursing note and vitals reviewed.   ED Course  Procedures (including critical care time) Labs Review Labs Reviewed - No data to display  Imaging Review Dg Lumbar Spine Complete  06/19/2015  CLINICAL DATA:  Acute on chronic lumbosacral back pain. EXAM: LUMBAR SPINE - COMPLETE 4+ VIEW COMPARISON:  Lumbar spine MRI 11/16/2014 FINDINGS: Previous minimal anterolisthesis of L4 on L5 is not seen. Alignment is maintained. Vertebral body heights are normal. The posterior elements are intact. Disc space narrowing and endplate spurring most prominent at L5-S1, and to lesser extent L4-L5. Moderate facet arthropathy in the lower lumbar spine. No fracture. Sacroiliac joints are symmetric and normal. IMPRESSION: Multilevel degenerative change in the lumbar spine with facet arthropathy and degenerative disc disease, similar to prior lumbar spine MRI allowing for differences in modality. No acute bony abnormality. Electronically Signed   By: Rubye Oaks M.D.   On: 06/19/2015 00:29   I have personally reviewed and evaluated these images and lab results as part of my medical decision-making.   EKG Interpretation None      MDM   Final diagnoses:  Lumbar radiculopathy, acute    Filed Vitals:   06/18/15 2334 06/19/15 0435 06/19/15 0729  BP: 115/78 132/85 121/92  Pulse: 101 88 80  Temp: 98.7 F (37.1 C)  97.9 F (36.6 C)  TempSrc: Oral  Oral  Resp: Height:  (1.651 m)    Weight: 78.472 kg    SpO2: 96% 99% 98%    Medications  ketorolac (TORADOL) injection 30 mg (30 mg Intramuscular Given 06/19/15 0729)    Victor Long is 55 y.o. male presenting with  back pain.  No neurological deficits and normal neuro exam.  Patient can walk but states is painful.  No loss of bowel or bladder control.  No concern for cauda equina.  No fever, night sweats, weight loss, h/o cancer, IVDU.  RICE protocol and pain medicine indicated and discussed with patient.  Evaluation does not show pathology that would require ongoing emergent intervention or inpatient treatment. Pt is hemodynamically stable and mentating appropriately. Discussed findings and plan with patient/guardian, who agrees with care plan. All questions answered. Return precautions discussed and outpatient follow up given.      Wynetta Emery, PA-C 06/19/15 1610  Rolland Porter, MD 06/26/15 1500

## 2015-06-19 NOTE — ED Notes (Signed)
Asked attending MD and PA to see patient, will see as soon as available

## 2015-06-19 NOTE — Discharge Instructions (Signed)
For pain control please take ibuprofen (also known as Motrin or Advil) 800mg  (this is normally 4 over the counter pills) 3 times a day  for 5 days. Take with food to minimize stomach irritation.  Take vicodin for breakthrough pain, do not drink alcohol, drive, care for children or do other critical tasks while taking vicodin.  Please follow with your primary care doctor in the next 2 days for a check-up. They must obtain records for further management.   Do not hesitate to return to the Emergency Department for any new, worsening or concerning symptoms.    Radicular Pain Radicular pain in either the arm or leg is usually from a bulging or herniated disk in the spine. A piece of the herniated disk may press against the nerves as the nerves exit the spine. This causes pain which is felt at the tips of the nerves down the arm or leg. Other causes of radicular pain may include:  Fractures.  Heart disease.  Cancer.  An abnormal and usually degenerative state of the nervous system or nerves (neuropathy). Diagnosis may require CT or MRI scanning to determine the primary cause.  Nerves that start at the neck (nerve roots) may cause radicular pain in the outer shoulder and arm. It can spread down to the thumb and fingers. The symptoms vary depending on which nerve root has been affected. In most cases radicular pain improves with conservative treatment. Neck problems may require physical therapy, a neck collar, or cervical traction. Treatment may take many weeks, and surgery may be considered if the symptoms do not improve.  Conservative treatment is also recommended for sciatica. Sciatica causes pain to radiate from the lower back or buttock area down the leg into the foot. Often there is a history of back problems. Most patients with sciatica are better after 2 to 4 weeks of rest and other supportive care. Short term bed rest can reduce the disk pressure considerably. Sitting, however, is not a good  position since this increases the pressure on the disk. You should avoid bending, lifting, and all other activities which make the problem worse. Traction can be used in severe cases. Surgery is usually reserved for patients who do not improve within the first months of treatment. Only take over-the-counter or prescription medicines for pain, discomfort, or fever as directed by your caregiver. Narcotics and muscle relaxants may help by relieving more severe pain and spasm and by providing mild sedation. Cold or massage can give significant relief. Spinal manipulation is not recommended. It can increase the degree of disc protrusion. Epidural steroid injections are often effective treatment for radicular pain. These injections deliver medicine to the spinal nerve in the space between the protective covering of the spinal cord and back bones (vertebrae). Your caregiver can give you more information about steroid injections. These injections are most effective when given within two weeks of the onset of pain.  You should see your caregiver for follow up care as recommended. A program for neck and back injury rehabilitation with stretching and strengthening exercises is an important part of management.  SEEK IMMEDIATE MEDICAL CARE IF:  You develop increased pain, weakness, or numbness in your arm or leg.  You develop difficulty with bladder or bowel control.  You develop abdominal pain.   This information is not intended to replace advice given to you by your health care provider. Make sure you discuss any questions you have with your health care provider.   Document Released: 05/07/2004 Document  Revised: 04/20/2014 Document Reviewed: 10/24/2014 Elsevier Interactive Patient Education [employer].

## 2015-06-20 ENCOUNTER — Ambulatory Visit (INDEPENDENT_AMBULATORY_CARE_PROVIDER_SITE_OTHER): Payer: Self-pay | Admitting: Family Medicine

## 2015-06-20 VITALS — BP 116/80 | HR 82 | Temp 97.6°F | Resp 16 | Ht 65.0 in | Wt 175.0 lb

## 2015-06-20 DIAGNOSIS — M519 Unspecified thoracic, thoracolumbar and lumbosacral intervertebral disc disorder: Secondary | ICD-10-CM

## 2015-06-20 DIAGNOSIS — G894 Chronic pain syndrome: Secondary | ICD-10-CM

## 2015-06-20 DIAGNOSIS — M5416 Radiculopathy, lumbar region: Secondary | ICD-10-CM

## 2015-06-20 MED ORDER — PREDNISONE 20 MG PO TABS: 20.0000 mg | ORAL_TABLET | Freq: Every day | ORAL | Status: DC

## 2015-06-20 MED ORDER — METHOCARBAMOL 500 MG PO TABS: ORAL_TABLET | ORAL | Status: DC

## 2015-06-20 MED ORDER — NAPROXEN 500 MG PO TABS: 500.0000 mg | ORAL_TABLET | Freq: Two times a day (BID) | ORAL | Status: DC

## 2015-06-20 NOTE — Progress Notes (Signed)
Patient ID: Victor FuelJeffery Long, male    DOB: 04/10/1961  Age: 55 y.o. MRN: [id number]  Chief Complaint  Patient presents with  . astablish care  . Sciatica    x 1 year     Subjective:   Patient has had chronic back pain for several years. No specific injury. 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 He had an MRI done last summer which confirmed the pathology. 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 The pain goes mostly from his left mid thigh on down his leg. 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 He lives with his mother, does some typical household related activities like mowing the lawn. Does not get a lot of regular exercise. Does not know of any trauma. He went to the emergency room last night, was given a few pain pills, and referred to orthopedics back for tomorrow.  He has been treated with several medicines in the past by another physician. He is last time he took prednisone was probably about 4 months ago. Has never had any procedural things done to his back. He was tried at one occasion on gabapentin for about 4 months, but it made him too drowsy. He cannot effectively work and be too drowsy.  Current allergies, medications, problem list, past/family and social histories reviewed.  Objective:  BP 116/80 mmHg  Pulse 82  Temp(Src) 97.6 F (36.4 C) (Oral)  Resp 16  Ht 5\' 5"  (1.651 m)  Wt 175 lb (79.379 kg)  BMI 29.12 kg/m2  SpO2 98%  No major acute distress. Has some pains in his upper back and shoulder area. His lumbar spine is tender midline at about the towel 34 and 45 and L5-S1 regions. The flexion is fair but extension is limited. He did have a pain pill several hours ago. When he arches his back backwards it hurts a good deal. Side-to-side tilt is fair though it is better to the right and to the left as is rotational motion. His straight leg raise test  is positive at about 75 on the left. Reflexes are 1-2+ symmetrical.  Assessment & Plan:   Assessment: No diagnosis found.    Plan: Continue to follow through with the back specialist. Try another course of prednisone as well as some muscle relaxant. Advised against regular use of narcotic strength pain medication because it his age he can very well or get dependent on it and still not be getting relief from it.  No orders of the defined types were placed in this encounter.    Meds ordered this encounter  Medications  . divalproex (DEPAKOTE) 500 MG DR tablet    Sig: Take 500 mg by mouth 3 (three) times daily.  Marland Kitchen. leucovorin (WELLCOVORIN) 25 MG tablet    Sig: Take by mouth once.         There are no Patient Instructions on file for this visit.   No Follow-up on file.   Juddson Cobern, MD 06/20/2015

## 2015-06-20 NOTE — Patient Instructions (Signed)
Take prednisone 3 daily for 3 days, then 2 daily for 3 days, then 1 daily for 3 days, then one half daily for 4 days  Continue taking the naproxen twice daily  Take Robaxin 500 mg in the morning and 1000 mg at bedtime (2500)  Keep your appointment with the back doctor  Return if not improving.  Other consideration would be to look for a family physician at [facility] [place], [facility], or [facility] or [facility] [address]

## 2015-09-15 ENCOUNTER — Emergency Department (HOSPITAL_COMMUNITY)
Admission: EM | Admit: 2015-09-15 | Discharge: 2015-09-15 | Disposition: A | Discharge status: Home / Self Care | Payer: PRIVATE HEALTH INSURANCE | Attending: Emergency Medicine | Admitting: Emergency Medicine

## 2015-09-15 ENCOUNTER — Encounter (HOSPITAL_COMMUNITY): Payer: Self-pay | Admitting: *Deleted

## 2015-09-15 ENCOUNTER — Emergency Department (HOSPITAL_COMMUNITY): Payer: PRIVATE HEALTH INSURANCE

## 2015-09-15 DIAGNOSIS — Y939 Activity, unspecified: Secondary | ICD-10-CM | POA: Insufficient documentation

## 2015-09-15 DIAGNOSIS — M25562 Pain in left knee: Secondary | ICD-10-CM | POA: Insufficient documentation

## 2015-09-15 DIAGNOSIS — Z79899 Other long term (current) drug therapy: Secondary | ICD-10-CM | POA: Insufficient documentation

## 2015-09-15 DIAGNOSIS — I10 Essential (primary) hypertension: Secondary | ICD-10-CM | POA: Insufficient documentation

## 2015-09-15 DIAGNOSIS — Y990 Civilian activity done for income or pay: Secondary | ICD-10-CM | POA: Insufficient documentation

## 2015-09-15 DIAGNOSIS — Y9289 Other specified places as the place of occurrence of the external cause: Secondary | ICD-10-CM | POA: Insufficient documentation

## 2015-09-15 DIAGNOSIS — X500XXA Overexertion from strenuous movement or load, initial encounter: Secondary | ICD-10-CM | POA: Insufficient documentation

## 2015-09-15 MED ORDER — NAPROXEN 500 MG PO TABS: 500.0000 mg | ORAL_TABLET | Freq: Two times a day (BID) | ORAL | Status: DC

## 2015-09-15 NOTE — ED Notes (Signed)
C/o left knee pain after squatting last pm. Denies injury.

## 2015-09-15 NOTE — ED Provider Notes (Signed)
CSN: [id number]     Arrival date & time 09/15/15  1743 History  By signing my name below, I, Phillis HaggisGabriella Gaje, attest that this documentation has been prepared under the direction and in the presence of Sealed Air CorporationHeather Malea Swilling, PA-C. Electronically Signed: Phillis HaggisGabriella Gaje, ED Scribe. 09/15/2015. 6:56 PM.   Chief Complaint  Patient presents with  . Knee Pain   The history is provided by the patient. No language interpreter was used.  HPI Comments: Victor Long is a 55 y.o. Male with a hx of HTN who presents to the Emergency Department complaining of left knee injury onset one day ago. He was bent down at work when he stood up and heard a "pop". Pt reports associated gradually resolving swelling that began this morning. He states that it feels unstable, but has difficulty with weightbearing due to pain. He has taken 200 mg Tramadol for pain to no relief. He denies hx of similar symptoms. He denies fever, numbness, or weakness.   Past Medical History  Diagnosis Date  . Hypertension   . Bipolar disorder (HCC)   . Depression   . Sciatica   . Back pain, chronic    Past Surgical History  Procedure Laterality Date  . Shoulder surgery    . Back surgery     Family History  Problem Relation Age of Onset  . Cancer Father    Social History  Substance Use Topics  . Smoking status: Never Smoker   . Smokeless tobacco: Never Used  . Alcohol Use: No    Review of Systems A complete 10 system review of systems was obtained and all systems are negative except as noted in the HPI and PMH.   Allergies  Review of patient's allergies indicates no known allergies.  Home Medications   Prior to Admission medications   Medication Sig Start Date End Date Taking? Authorizing Provider  amoxicillin (AMOXIL) 250 MG capsule Take 1 capsule (250 mg total) by mouth 2 (two) times daily. 04/02/15   Tharon AquasFrank C Patrick, PA  diclofenac sodium (VOLTAREN) 1 % GEL Apply 1 application topically 4 (four) times daily. 05/30/15    Hayden Rasmussenavid Mabe, NP  divalproex (DEPAKOTE) 500 MG DR tablet Take 500 mg by mouth 3 (three) times daily.    Historical Provider, MD  HYDROcodone-acetaminophen (NORCO/VICODIN) 5-325 MG tablet Take 1-2 tablets by mouth every 6 hours as needed for pain and/or cough. 06/19/15   Nicole Pisciotta, PA-C  leucovorin (WELLCOVORIN) 25 MG tablet Take by mouth once.    Historical Provider, MD  methocarbamol (ROBAXIN) 500 MG tablet Take one in AM and 1-2 in PM for muscle relaxant 06/20/15   Peyton Najjaravid H Hopper, MD  naproxen (NAPROSYN) 500 MG tablet Take 1 tablet (500 mg total) by mouth 2 (two) times daily. 06/20/15   Peyton Najjaravid H Hopper, MD  omeprazole (PRILOSEC) 20 MG capsule Take 1 capsule (20 mg total) by mouth daily. 04/15/14   Elson AreasLeslie K Sofia, PA-C  phenylephrine-shark liver oil-mineral oil-petrolatum (PREPARATION H) 0.25-3-14-71.9 % rectal ointment Place 1 application rectally 2 (two) times daily as needed. 07/24/14   Loma BostonStirling Harper, MD  predniSONE (DELTASONE) 20 MG tablet Take 1 tablet (20 mg total) by mouth daily with breakfast. 06/20/15   Peyton Najjaravid H Hopper, MD  traMADol (ULTRAM) 50 MG tablet 1-2 tabs po q 6 hr prn pain Maximum dose= 8 tablets per day 05/30/15   Hayden Rasmussenavid Mabe, NP   BP 162/71 mmHg  Pulse 87  Temp(Src) 98.3 F (36.8 C) (Oral)  Resp 14  SpO2  98% Physical Exam  Constitutional: He is oriented to person, place, and time. He appears well-developed and well-nourished.  HENT:  Head: Normocephalic and atraumatic.  Eyes: Conjunctivae and EOM are normal. Pupils are equal, round, and reactive to light.  Neck: Normal range of motion. Neck supple.  Cardiovascular: Normal rate and regular rhythm.   Pulmonary/Chest: Effort normal and breath sounds normal.  Musculoskeletal: Normal range of motion.       Left knee: He exhibits normal range of motion, no swelling and no erythema.  No obvious laxity with anterior and posterior drawer, varus or valgus stress; Full ROM of left knee; no erythema, edema, or warmth to the knee; distal  sensation of foot intact; 2+ DP pulse in left foot  Neurological: He is alert and oriented to person, place, and time.  Skin: Skin is warm and dry.  Psychiatric: He has a normal mood and affect. His behavior is normal.  Nursing note and vitals reviewed.   ED Course  Procedures (including critical care time) DIAGNOSTIC STUDIES: Oxygen Saturation is 98% on RA, normal by my interpretation.    COORDINATION OF CARE: 7:12 PM-Discussed treatment plan which includes x-ray and knee brace with pt at bedside and pt agreed to plan.    Labs Review Labs Reviewed - No data to display  Imaging Review Dg Knee Complete 4 Views Left  09/15/2015  CLINICAL DATA:  Pain and swelling after bending EXAM: LEFT KNEE - COMPLETE 4+ VIEW COMPARISON:  None. FINDINGS: Frontal, lateral, and bilateral oblique views were obtained. 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 No fracture or dislocation. There is a small joint effusion. There is slight narrowing in all compartments. There is mild spurring along the posterior patella. No erosive change. IMPRESSION: Mild generalized osteoarthritic change. Small joint effusion. No fracture or dislocation. Tripartite patella, an anatomic variant. Electronically Signed   By: Bretta Bang III M.D.   On: 09/15/2015 18:52   I have personally reviewed and evaluated these images and lab results as part of my medical decision-making.   EKG Interpretation None      MDM  Patient X-Ray negative for obvious fracture or dislocation.  No obvious laxity with Anterior and Posterior drawer or varus and valgus stress.  Patient ambulating in the ED without difficulty.  Patient given brace while in ED, conservative therapy recommended and discussed. Patient will be dc home & is agreeable with above plan.  Final diagnoses:  None   I personally performed the services described in this documentation, which was scribed in my presence. The recorded information has been reviewed and  is accurate.    Santiago Glad, PA-C 09/16/15 0011  Rolland Porter, MD 09/24/15 [phone]

## 2015-12-13 ENCOUNTER — Emergency Department (HOSPITAL_COMMUNITY)
Admission: EM | Admit: 2015-12-13 | Discharge: 2015-12-13 | Disposition: A | Discharge status: Home / Self Care | Payer: PRIVATE HEALTH INSURANCE | Attending: Emergency Medicine | Admitting: Emergency Medicine

## 2015-12-13 ENCOUNTER — Encounter (HOSPITAL_COMMUNITY): Payer: Self-pay

## 2015-12-13 DIAGNOSIS — F10121 Alcohol abuse with intoxication delirium: Secondary | ICD-10-CM

## 2015-12-13 DIAGNOSIS — R45851 Suicidal ideations: Secondary | ICD-10-CM

## 2015-12-13 DIAGNOSIS — T50902A Poisoning by unspecified drugs, medicaments and biological substances, intentional self-harm, initial encounter: Secondary | ICD-10-CM

## 2015-12-13 DIAGNOSIS — Z79899 Other long term (current) drug therapy: Secondary | ICD-10-CM | POA: Insufficient documentation

## 2015-12-13 DIAGNOSIS — I10 Essential (primary) hypertension: Secondary | ICD-10-CM | POA: Insufficient documentation

## 2015-12-13 DIAGNOSIS — T404X2A Poisoning by other synthetic narcotics, intentional self-harm, initial encounter: Secondary | ICD-10-CM | POA: Insufficient documentation

## 2015-12-13 DIAGNOSIS — F329 Major depressive disorder, single episode, unspecified: Secondary | ICD-10-CM | POA: Insufficient documentation

## 2015-12-13 LAB — RAPID URINE DRUG SCREEN, HOSP PERFORMED
Amphetamines: NOT DETECTED
Barbiturates: NOT DETECTED
Benzodiazepines: NOT DETECTED
Cocaine: NOT DETECTED
Opiates: NOT DETECTED
Tetrahydrocannabinol: NOT DETECTED

## 2015-12-13 LAB — CBC WITH DIFFERENTIAL/PLATELET
Basophils Absolute: 0 10*3/uL (ref 0.0–0.1)
Basophils Relative: 0 %
Eosinophils Absolute: 0.1 10*3/uL (ref 0.0–0.7)
Eosinophils Relative: 1 %
HCT: 47.1 % (ref 39.0–52.0)
Hemoglobin: 16.5 g/dL (ref 13.0–17.0)
Lymphocytes Relative: 16 %
Lymphs Abs: 1.3 10*3/uL (ref 0.7–4.0)
MCH: 31.4 pg (ref 26.0–34.0)
MCHC: 35 g/dL (ref 30.0–36.0)
MCV: 89.7 fL (ref 78.0–100.0)
Monocytes Absolute: 0.6 10*3/uL (ref 0.1–1.0)
Monocytes Relative: 8 %
Neutro Abs: 6 10*3/uL (ref 1.7–7.7)
Neutrophils Relative %: 75 %
Platelets: 271 10*3/uL (ref 150–400)
RBC: 5.25 MIL/uL (ref 4.22–5.81)
RDW: 13.4 % (ref 11.5–15.5)
WBC: 7.9 10*3/uL (ref 4.0–10.5)

## 2015-12-13 LAB — COMPREHENSIVE METABOLIC PANEL
ALT: 69 U/L — ABNORMAL HIGH (ref 17–63)
AST: 120 U/L — ABNORMAL HIGH (ref 15–41)
Albumin: 3.6 g/dL (ref 3.5–5.0)
Alkaline Phosphatase: 85 U/L (ref 38–126)
Anion gap: 6 (ref 5–15)
BUN: 8 mg/dL (ref 6–20)
CO2: 27 mmol/L (ref 22–32)
Calcium: 8.2 mg/dL — ABNORMAL LOW (ref 8.9–10.3)
Chloride: 113 mmol/L — ABNORMAL HIGH (ref 101–111)
Creatinine, Ser: 1.04 mg/dL (ref 0.61–1.24)
GFR calc Af Amer: >60 mL/min (ref >60)
GFR calc non Af Amer: >60 mL/min (ref >60)
Glucose, Bld: 103 mg/dL — ABNORMAL HIGH (ref 65–99)
Potassium: 4.4 mmol/L (ref 3.5–5.1)
Sodium: 146 mmol/L — ABNORMAL HIGH (ref 135–145)
Total Bilirubin: 0.7 mg/dL (ref 0.3–1.2)
Total Protein: 6.7 g/dL (ref 6.5–8.1)

## 2015-12-13 LAB — ACETAMINOPHEN LEVEL: Acetaminophen (Tylenol), Serum: <10 ug/mL — ABNORMAL LOW (ref 10–30)

## 2015-12-13 LAB — SALICYLATE LEVEL: Salicylate Lvl: <4 mg/dL (ref 2.8–30.0)

## 2015-12-13 LAB — ETHANOL: Alcohol, Ethyl (B): 320 mg/dL (ref <5)

## 2015-12-13 LAB — VALPROIC ACID LEVEL: Valproic Acid Lvl: <10 ug/mL — ABNORMAL LOW (ref 50.0–100.0)

## 2015-12-13 MED ORDER — ONDANSETRON HCL 4 MG PO TABS: 4.0000 mg | ORAL_TABLET | Freq: Three times a day (TID) | ORAL | Status: DC | PRN

## 2015-12-13 MED ORDER — ALUM & MAG HYDROXIDE-SIMETH 200-200-20 MG/5ML PO SUSP: 30.0000 mL | ORAL | Status: DC | PRN

## 2015-12-13 MED ORDER — VITAMIN B-1 100 MG PO TABS
100.0000 mg | ORAL_TABLET | Freq: Every day | ORAL | Status: DC
  Administered 2015-12-13: 100 mg via ORAL
  Filled 2015-12-13: qty 1

## 2015-12-13 MED ORDER — LORAZEPAM 1 MG PO TABS: 0.0000 mg | ORAL_TABLET | Freq: Four times a day (QID) | ORAL | Status: DC

## 2015-12-13 MED ORDER — THIAMINE HCL 100 MG/ML IJ SOLN
100.0000 mg | Freq: Every day | INTRAMUSCULAR | Status: DC
Start: 2015-12-13 — End: 2015-12-13

## 2015-12-13 MED ORDER — ACETAMINOPHEN 325 MG PO TABS: 650.0000 mg | ORAL_TABLET | ORAL | Status: DC | PRN

## 2015-12-13 MED ORDER — IBUPROFEN 200 MG PO TABS: 600.0000 mg | ORAL_TABLET | Freq: Three times a day (TID) | ORAL | Status: DC | PRN

## 2015-12-13 MED ORDER — LORAZEPAM 1 MG PO TABS: 0.0000 mg | ORAL_TABLET | Freq: Two times a day (BID) | ORAL | Status: DC

## 2015-12-13 NOTE — BH Assessment (Addendum)
Assessment Note  Victor FuelJeffery Long is an 55 y.o. male that presents this date after EMS found patient in his vehicle intoxicated. Patient is unsure how EMS got involved since he was parked in his mother's driveway (patient resides with mother Victor Long [phone]). Patient thinks his mother may have contacted EMS after patient reported consuming over a 1/5th of liquor on 12/13/15 and taking some Tramadol. 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 Patient denies any S/I, H/I or AVH. Patient stated he has been diagnosed  with Bipolar in 1998 and has been "on and off medications since then." Patient reports he is currently prescribed Depakote 1500 mg but hasn't taken it in "a couple days." Patient cannot remember who his current provider is. Patient denies any current symptoms associated with being Bipolar but  reports ongoing depression due to loss of his dog and girlfriend in the last month. Patient reports symptoms to include: feelings of hopelessness and adendona. Patient denies any other SA use but reports daily use (1 pint to 1/5 th of liquor every other day). Patient reports last use on 12/13/15 when patient reported consuming 1/5 th of liquor. Patient states he does not remember "anything from yesterday." Patient states he was prescribed Tramadol for pain management issues but only takes it "once and a while." Patient stated he "took some yesterday" but was unsure of the amount or who his current provider was. Patient states he may have acquired that medication from Urgent Care. Patient denies any thoughts of self harm and reports just really being depressed and "over did it." Patient denies any previous inpatient admissions or attempts/gestures at self harm. Patient can contact for safety and is asking to be discharged with  outpatient resources. Patient has consented to gather collateral from his mother prior to discharge. Admission note stated: "Patient relapsed two weeks ago. Patient denies any prior inpatient treatment but has received services [facility] from [facility]. Patient was found by  EMS with patient being intoxicated. He admitted to drinking a fifth of liquor and to taking a number of tramadol tablets. The number of tramadol tablets he reports taking has varied multiple times. He also reports taking 1500 milligrams of Depakote which he takes for bipolar disorder. Case was staffed with Cobos MD who recommended patient be discharged with OP resources.   Diagnosis: MDD  Past Medical History:  Past Medical History:  Diagnosis Date  . Back pain, chronic   . Bipolar disorder (HCC)   . Depression   . Hypertension   . Sciatica     Past Surgical History:  Procedure Laterality Date  . BACK SURGERY    . SHOULDER SURGERY      Family History:  Family History  Problem Relation Age of Onset  . Cancer Father     Social History:  reports that he has never smoked. He has never used smokeless tobacco. He reports that he does not drink alcohol or use drugs.  Additional Social History:  Alcohol / Drug Use Pain Medications: See MAR Prescriptions: See MAR Over the Counter: See MAR History of alcohol / drug use?: Yes Longest period of sobriety (when/how long): one year and a half Withdrawal Symptoms: Agitation Substance #1 Name of Substance 1: Alcohol 1 - Age of First Use: 17 1 - Amount (size/oz): 1/5 liquor yesterday  1 - Frequency: daily 1 - Duration: Last year 1 - Last Use / Amount: 12/13/15 pt stated he used 1/5 of ETOH  CIWA: CIWA-Ar BP: 112/83 Pulse Rate: 89 COWS:    Allergies: No Known Allergies  Home Medications:  (Not in a hospital admission)  OB/GYN Status:  No LMP for male patient.  General Assessment Data Location of Assessment: [facility] TTS Assessment: In system Is this a Tele or  Face-to-Face Assessment?: Face-to-Face Is this an Initial Assessment or a Re-assessment for this encounter?: Initial Assessment Marital status: Divorced Halls name: na Is patient pregnant?: No Pregnancy Status: No Living Arrangements: Parent Can pt return to current living arrangement?: Yes Admission Status: Voluntary Is patient capable of signing voluntary admission?: Yes Referral Source: Self/Family/Friend Insurance type: No  Medical Screening Exam [facility] Walk-in ONLY) Medical Exam completed: Yes  Crisis Care Plan Living Arrangements: Parent Legal Guardian: Other: (na) Name of Psychiatrist: none Name of Therapist: none  Education Status Is patient currently in school?: No Current Grade: na Highest grade of school patient has completed: 12 Name of school: na Contact person: na  Risk to self with the past 6 months Suicidal Ideation: No Has patient been a risk to self within the past 6 months prior to admission? : No Suicidal Intent: No Has patient had any suicidal intent within the past 6 months prior to admission? : No Is patient at risk for suicide?: No Suicidal Plan?: No Has patient had any suicidal plan within the past 6 months prior to admission? : No Access to Means: No What has been your use of drugs/alcohol within the last 12 months?: current use Previous Attempts/Gestures: No How many times?: 0 Other Self Harm Risks: none Triggers for Past Attempts: Unknown Intentional Self Injurious Behavior: None Family Suicide History: No Recent stressful life event(s): Other (Comment) (family issues) Persecutory voices/beliefs?: No Depression: Yes Depression Symptoms: Loss of interest in usual pleasures, Feeling worthless/self pity Substance abuse history and/or treatment for substance abuse?: Yes Suicide prevention information given to non-admitted patients: Not applicable  Risk to Others within the past 6 months Homicidal Ideation: No Does patient have any lifetime  risk of violence toward others beyond the six months prior to admission? : No Thoughts of Harm to Others: No Current Homicidal Intent: No Current Homicidal Plan: No Access to Homicidal Means: No Identified Victim: na History of harm to others?: No Assessment of Violence: None Noted Violent Behavior Description: na Does patient have access to weapons?: No Criminal Charges Pending?: No Does patient have a court date: No Is patient on probation?: No  Psychosis Hallucinations: None noted Delusions: None noted  Mental Status Report Appearance/Hygiene: In scrubs Eye Contact: Fair Motor Activity: Freedom of movement Speech: Logical/coherent Level of Consciousness: Drowsy Mood: Depressed Affect: Depressed Anxiety Level: Minimal Thought Processes: Coherent, Relevant Judgement: Unimpaired Orientation: Person, Place, Time Obsessive Compulsive Thoughts/Behaviors: None  Cognitive Functioning Concentration: Normal Memory: Recent Intact, Remote Intact IQ: Average Insight: Fair Impulse Control: Fair Appetite: Fair Weight Loss: 0 Weight Gain: 0 Sleep: Decreased Total Hours of Sleep: 2 Vegetative Symptoms: None  ADLScreening [facility] Assessment Services) Patient's cognitive ability adequate to safely complete daily activities?: Yes Patient able to express need for assistance with ADLs?: Yes Independently performs ADLs?: Yes (appropriate for developmental age)  Prior Inpatient Therapy Prior Inpatient Therapy: No Prior Therapy Dates: na Prior Therapy Facilty/Provider(s): na Reason for Treatment: na  Prior Outpatient Therapy Prior Outpatient Therapy: Yes Prior Therapy Dates: 2017 Prior Therapy Facilty/Provider(s): [facility] Reason for Treatment: SA  issues Does patient have an ACCT team?: No Does patient have Intensive In-House Services?  : No Does patient have [facility] services? : Yes Does patient have P4CC services?: No  ADL Screening (condition at time of admission) Patient's  cognitive ability adequate to safely complete daily activities?: Yes Is the patient deaf or have difficulty hearing?: No Does the patient have difficulty seeing, even when wearing glasses/contacts?: No Does the patient have difficulty concentrating, remembering, or making decisions?: No Patient able to express need for assistance with ADLs?: Yes Does the patient have difficulty dressing or bathing?: No Independently performs ADLs?: Yes (appropriate for developmental age) Does the patient have difficulty walking or climbing stairs?: No Weakness of Legs: None Weakness of Arms/Hands: None  Home Assistive Devices/Equipment Home Assistive Devices/Equipment: None  Therapy Consults (therapy consults require a physician order) PT Evaluation Needed: No OT Evalulation Needed: No SLP Evaluation Needed: No Abuse/Neglect Assessment (Assessment to be complete while patient is alone) Physical Abuse: Denies Verbal Abuse: Denies Sexual Abuse: Denies Exploitation of patient/patient's resources: Denies Self-Neglect: Denies Values / Beliefs Cultural Requests During Hospitalization: None Spiritual Requests During Hospitalization: None Consults Spiritual Care Consult Needed: No Social Work Consult Needed: No Merchant navy officer (For Healthcare) Does patient have an advance directive?: No Would patient like information on creating an advanced directive?: No - patient declined information (pt declines)    Additional Information 1:1 In Past 12 Months?: No CIRT Risk: No Elopement Risk: No Does patient have medical clearance?: Yes     Disposition: Case was staffed with Cobos MD who recommended patient be discharged with OP resources.   Disposition Initial Assessment Completed for this Encounter: Yes Disposition of Patient: Other dispositions Other disposition(s): Other (Comment) (D/Ced Outpatient Resources)  On Site Evaluation by:   Reviewed with Physician:    Alfredia Ferguson 12/13/2015 8:26  AM

## 2015-12-13 NOTE — ED Triage Notes (Signed)
TTS feels that in a couple of more hours it will be fine to discharge pt home with out patient resources. Pt gave RN Hydrographic surveyor(writer) permission to speak with his mother. She will be notified prior to D/C in order to come transport pt home.

## 2015-12-13 NOTE — ED Triage Notes (Signed)
David, TTS in to evaluate pt at present time.

## 2015-12-13 NOTE — ED Provider Notes (Signed)
[facility]-EMERGENCY DEPT Provider Note   CSN: [id number] Arrival date & time: 12/13/15  0053     History   Chief Complaint Chief Complaint  Patient presents with  . Altered Mental Status    HPI Level 5 Caveat: Altered mental status Victor Long is a 55 y.o. male who was found parked in the driveway about also was not his. EMS found him to be intoxicated. He admitted to drinking a fifth of liquor and to taking a number of tramadol tablets. The number of tramadol tablets he reports taking has varied multiple times. He also reports taking 1500 milligrams of Depakote which he takes for bipolar disorder. Although he tells me he was taking the tramadol to sleep he is admitted to other staff members that he wanted to die. The reason he gives his that he is no longer able to tolerate life with his mother.  HPI  Past Medical History:  Diagnosis Date  . Back pain, chronic   . Bipolar disorder (HCC)   . Depression   . Hypertension   . Sciatica     There are no active problems to display for this patient.   Past Surgical History:  Procedure Laterality Date  . BACK SURGERY    . SHOULDER SURGERY         Home Medications    Prior to Admission medications   Medication Sig Start Date End Date Taking? Authorizing Provider  divalproex (DEPAKOTE) 500 MG DR tablet Take 500 mg by mouth 3 (three) times daily.   Yes Historical Provider, MD  traZODone (DESYREL) 50 MG tablet Take 50 mg by mouth at bedtime as needed for sleep.   Yes Historical Provider, MD  amoxicillin (AMOXIL) 250 MG capsule Take 1 capsule (250 mg total) by mouth 2 (two) times daily. Patient not taking: Reported on 12/13/2015 04/02/15   Tharon AquasFrank C Patrick, PA  diclofenac sodium (VOLTAREN) 1 % GEL Apply 1 application topically 4 (four) times daily. Patient not taking: Reported on 12/13/2015 05/30/15   Hayden Rasmussenavid Mabe, NP  HYDROcodone-acetaminophen (NORCO/VICODIN) 5-325 MG tablet Take 1-2 tablets by mouth every 6 hours as needed for pain  and/or cough. Patient not taking: Reported on 12/13/2015 06/19/15   Joni ReiningNicole Pisciotta, PA-C  methocarbamol (ROBAXIN) 500 MG tablet Take one in AM and 1-2 in PM for muscle relaxant Patient not taking: Reported on 12/13/2015 06/20/15   Peyton Najjaravid H Hopper, MD  naproxen (NAPROSYN) 500 MG tablet Take 1 tablet (500 mg total) by mouth 2 (two) times daily. Patient not taking: Reported on 12/13/2015 09/15/15   Santiago GladHeather Laisure, PA-C  omeprazole (PRILOSEC) 20 MG capsule Take 1 capsule (20 mg total) by mouth daily. Patient not taking: Reported on 12/13/2015 04/15/14   Elson AreasLeslie K Sofia, PA-C  phenylephrine-shark liver oil-mineral oil-petrolatum (PREPARATION H) 0.25-3-14-71.9 % rectal ointment Place 1 application rectally 2 (two) times daily as needed. Patient not taking: Reported on 12/13/2015 07/24/14   Loma BostonStirling Harper, MD  predniSONE (DELTASONE) 20 MG tablet Take 1 tablet (20 mg total) by mouth daily with breakfast. Patient not taking: Reported on 12/13/2015 06/20/15   Peyton Najjaravid H Hopper, MD  traMADol (ULTRAM) 50 MG tablet 1-2 tabs po q 6 hr prn pain Maximum dose= 8 tablets per day Patient not taking: Reported on 12/13/2015 05/30/15   Hayden Rasmussenavid Mabe, NP    Family History Family History  Problem Relation Age of Onset  . Cancer Father     Social History Social History  Substance Use Topics  . Smoking status: Never Smoker  .  Smokeless tobacco: Never Used  . Alcohol use No     Allergies   Review of patient's allergies indicates no known allergies.   Review of Systems Review of Systems  Unable to perform ROS: Mental status change     Physical Exam Updated Vital Signs BP 112/83   Pulse 89   Temp 97.6 F (36.4 C)   Resp 16   SpO2 94%   Physical Exam General: Well-developed, well-nourished male in no acute distress; appearance consistent with age of record HENT: normocephalic; atraumatic Eyes: pupils equal, round and reactive to light; extraocular muscles intact Neck: supple Heart: regular rate and rhythm Lungs: clear  to auscultation bilaterally Abdomen: soft; nondistended; nontender; no masses or hepatosplenomegaly; bowel sounds present Extremities: No deformity; full range of motion; pulses normal Neurologic: Somnolent but arousable; dysarthria; motor function intact in all extremities and symmetric; no facial droop; generalized tremor Skin: Warm and dry    ED Treatments / Results   Nursing notes and vitals signs, including pulse oximetry, reviewed.  Summary of this visit's results, reviewed by myself:  Labs:  Results for orders placed or performed during the hospital encounter of 12/13/15 (from the past 24 hour(s))  Rapid urine drug screen (hospital performed)     Status: None   Collection Time: 12/13/15  1:35 AM  Result Value Ref Range   Opiates NONE DETECTED NONE DETECTED   Cocaine NONE DETECTED NONE DETECTED   Benzodiazepines NONE DETECTED NONE DETECTED   Amphetamines NONE DETECTED NONE DETECTED   Tetrahydrocannabinol NONE DETECTED NONE DETECTED   Barbiturates NONE DETECTED NONE DETECTED  Valproic acid level     Status: Abnormal   Collection Time: 12/13/15  1:40 AM  Result Value Ref Range   Valproic Acid Lvl <10 (L) 50.0 - 100.0 ug/mL  Salicylate level     Status: None   Collection Time: 12/13/15  1:40 AM  Result Value Ref Range   Salicylate Lvl <4.0 2.8 - 30.0 mg/dL  Acetaminophen level     Status: Abnormal   Collection Time: 12/13/15  1:40 AM  Result Value Ref Range   Acetaminophen (Tylenol), Serum <10 (L) 10 - 30 ug/mL  Ethanol     Status: Abnormal   Collection Time: 12/13/15  1:40 AM  Result Value Ref Range   Alcohol, Ethyl (B) 320 (HH) <5 mg/dL  CBC with Differential/Platelet     Status: None   Collection Time: 12/13/15  1:40 AM  Result Value Ref Range   WBC 7.9 4.0 - 10.5 K/uL   RBC 5.25 4.22 - 5.81 MIL/uL   Hemoglobin 16.5 13.0 - 17.0 g/dL   HCT 16.1 09.6 - 04.5 %   MCV 89.7 78.0 - 100.0 fL   MCH 31.4 26.0 - 34.0 pg   MCHC 35.0 30.0 - 36.0 g/dL   RDW 40.9 81.1 - 91.4  %   Platelets 271 150 - 400 K/uL   Neutrophils Relative % 75 %   Neutro Abs 6.0 1.7 - 7.7 K/uL   Lymphocytes Relative 16 %   Lymphs Abs 1.3 0.7 - 4.0 K/uL   Monocytes Relative 8 %   Monocytes Absolute 0.6 0.1 - 1.0 K/uL   Eosinophils Relative 1 %   Eosinophils Absolute 0.1 0.0 - 0.7 K/uL   Basophils Relative 0 %   Basophils Absolute 0.0 0.0 - 0.1 K/uL  Comprehensive metabolic panel     Status: Abnormal   Collection Time: 12/13/15  1:40 AM  Result Value Ref Range   Sodium 146 (H)  135 - 145 mmol/L   Potassium 4.4 3.5 - 5.1 mmol/L   Chloride 113 (H) 101 - 111 mmol/L   CO2 27 22 - 32 mmol/L   Glucose, Bld 103 (H) 65 - 99 mg/dL   BUN 8 6 - 20 mg/dL   Creatinine, Ser 5.46 0.61 - 1.24 mg/dL   Calcium 8.2 (L) 8.9 - 10.3 mg/dL   Total Protein 6.7 6.5 - 8.1 g/dL   Albumin 3.6 3.5 - 5.0 g/dL   AST 270 (H) 15 - 41 U/L   ALT 69 (H) 17 - 63 U/L   Alkaline Phosphatase 85 38 - 126 U/L   Total Bilirubin 0.7 0.3 - 1.2 mg/dL   GFR calc non Af Amer >60 >60 mL/min   GFR calc Af Amer >60 >60 mL/min   Anion gap 6 5 - 15   EKG  EKG Interpretation  Date/Time:  Friday December 13 2015 01:09:41 EDT Ventricular Rate:  95 PR Interval:    QRS Duration: 111 QT Interval:  382 QTC Calculation: 481 R Axis:   68 Text Interpretation:  Sinus rhythm Borderline prolonged QT interval Tremor Artifact not seen previously Confirmed by Tayen Narang  MD, Jonny Ruiz ([id number]) on 12/13/2015 1:12:38 AM      7:16 AM Patient sleeping but arousable. Psych holding orders given.  Procedures (including critical care time)  Final Clinical Impressions(s) / ED Diagnoses   Final diagnoses:  Alcohol intoxication delirium (HCC)  Drug overdose, intentional, initial encounter [facility])  Suicidal ideation       Paula Libra, MD 12/13/15 [phone]

## 2015-12-13 NOTE — ED Notes (Signed)
Per EMS- Pt found at private residence in a driveway asleep in car. Odor of ETOH and stated that he took a large amount of tramadol, a number that keeps changing. Admits to drinking a fifth of liquor at 1900.

## 2015-12-13 NOTE — ED Notes (Signed)
Mother- [phone] Verdon CumminsJesse McCullouch

## 2015-12-13 NOTE — ED Provider Notes (Signed)
Pt clinically sober, denies SI/HI, requests to go home.  He has been seen by TTS and cleared to go home from a psychiatric stand point.  Plan to d/c home with outpatient follow up.     Tilden FossaElizabeth Kiyoto Slomski, MD 12/13/15 [phone]

## 2015-12-13 NOTE — ED Triage Notes (Signed)
Pt sleeping with sitter at bedside. Continue to observe.

## 2015-12-13 NOTE — ED Notes (Signed)
Bed: [id number] Expected date:  Expected time:  Means of arrival:  Comments: EMS 55yo M ETOH / Tramadol  Found in a car

## 2016-01-03 ENCOUNTER — Encounter (HOSPITAL_COMMUNITY): Payer: Self-pay | Admitting: *Deleted

## 2016-01-03 ENCOUNTER — Inpatient Hospital Stay (HOSPITAL_COMMUNITY)
Admission: EM | Admit: 2016-01-03 | Discharge: 2016-01-05 | DRG: 379 | Disposition: A | Discharge status: Home / Self Care | Payer: PRIVATE HEALTH INSURANCE | Attending: Internal Medicine | Admitting: Internal Medicine

## 2016-01-03 DIAGNOSIS — K5733 Diverticulitis of large intestine without perforation or abscess with bleeding: Secondary | ICD-10-CM

## 2016-01-03 DIAGNOSIS — F1010 Alcohol abuse, uncomplicated: Secondary | ICD-10-CM | POA: Diagnosis present

## 2016-01-03 DIAGNOSIS — K5792 Diverticulitis of intestine, part unspecified, without perforation or abscess without bleeding: Secondary | ICD-10-CM | POA: Diagnosis present

## 2016-01-03 DIAGNOSIS — K625 Hemorrhage of anus and rectum: Secondary | ICD-10-CM | POA: Diagnosis present

## 2016-01-03 DIAGNOSIS — F319 Bipolar disorder, unspecified: Secondary | ICD-10-CM | POA: Diagnosis present

## 2016-01-03 DIAGNOSIS — K219 Gastro-esophageal reflux disease without esophagitis: Secondary | ICD-10-CM | POA: Diagnosis present

## 2016-01-03 DIAGNOSIS — I10 Essential (primary) hypertension: Secondary | ICD-10-CM | POA: Diagnosis present

## 2016-01-03 LAB — CBC
HCT: 48.9 % (ref 39.0–52.0)
Hemoglobin: 16.6 g/dL (ref 13.0–17.0)
MCH: 31.4 pg (ref 26.0–34.0)
MCHC: 33.9 g/dL (ref 30.0–36.0)
MCV: 92.4 fL (ref 78.0–100.0)
Platelets: 315 10*3/uL (ref 150–400)
RBC: 5.29 MIL/uL (ref 4.22–5.81)
RDW: 13.3 % (ref 11.5–15.5)
WBC: 6.8 10*3/uL (ref 4.0–10.5)

## 2016-01-03 LAB — SAMPLE TO BLOOD BANK

## 2016-01-03 NOTE — ED Provider Notes (Signed)
[facility]-EMERGENCY DEPT Provider Note   CSN: [id number] Arrival date & time: 01/03/16  2231 By signing my name below, I, [name], attest that this documentation has been prepared under the direction and in the presence of Pricilla LovelessScott Khang Hannum, MD. Electronically Signed: [name], ED Scribe. 01/03/16. 12:14 AM.  History   Chief Complaint Chief Complaint  Patient presents with  . Rectal Bleeding   HPI Comments: [name] is a 55 y.o. male with h/o GERD who presents to the Emergency Department complaining of watery diarrhea and bowel incontinence with blood onset 4 weeks ago, worsening two hours ago. Pt states that he was at work two hours ago and had loose, watery diarrhea and bowel incontinence. Pt notes that he has had little blood in his bowels since the onset of his symptoms but states there was more blood than usual earlier. He reports that since his episode earlier, he has been experiencing generalized weakness. Pt also has associated dull, abdominal pain and chills. Pt is not on blood thinners. He has h/o ulcers. Denies h/o similar symptoms. Pt drinks alcohol daily but denies drug use. Pt denies hematemesis, vomiting, fever, or any other associated symptoms.   The history is provided by the patient. No language interpreter was used.    Past Medical History:  Diagnosis Date  . Back pain, chronic   . Bipolar disorder (HCC)   . Depression   . Hypertension   . Sciatica     Patient Active Problem List   Diagnosis Date Noted  . Diverticulitis 01/04/2016  . BRBPR (bright red blood per rectum) 01/04/2016  . ETOH abuse 01/04/2016    Past Surgical History:  Procedure Laterality Date  . BACK SURGERY    . SHOULDER SURGERY         Home Medications    Prior to Admission medications   Medication Sig Start Date End Date Taking? Authorizing Provider  divalproex (DEPAKOTE) 500 MG DR tablet Take 500 mg by mouth 3 (three) times daily.   Yes Historical Provider, MD  naproxen  (NAPROSYN) 500 MG tablet Take 1 tablet (500 mg total) by mouth 2 (two) times daily. 09/15/15  Yes Heather Laisure, PA-C  traZODone (DESYREL) 50 MG tablet Take 50 mg by mouth at bedtime as needed for sleep.   Yes Historical Provider, MD    Family History Family History  Problem Relation Age of Onset  . Cancer Father     Social History Social History  Substance Use Topics  . Smoking status: Never Smoker  . Smokeless tobacco: Never Used  . Alcohol use No     Allergies   Review of patient's allergies indicates no known allergies.   Review of Systems Review of Systems  Constitutional: Positive for chills. Negative for fever.  Gastrointestinal: Positive for abdominal pain and diarrhea. Negative for vomiting.  Neurological: Positive for weakness.  All other systems reviewed and are negative.    Physical Exam Updated Vital Signs BP 123/84   Pulse 92   Temp 98.2 F (36.8 C) (Oral)   Resp 18   Ht 5\' 5"  (1.651 m)   Wt 167 lb 15.9 oz (76.2 kg)   SpO2 97%   BMI 27.96 kg/m   Physical Exam  Constitutional: He is oriented to person, place, and time. He appears well-developed and well-nourished.  HENT:  Head: Normocephalic and atraumatic.  Right Ear: External ear normal.  Left Ear: External ear normal.  Nose: Nose normal.  Eyes: Right eye exhibits no discharge. Left eye exhibits  no discharge.  Neck: Neck supple.  Cardiovascular: Regular rhythm and normal heart sounds.  Tachycardia present.   Heart rate in the low 100's.  Pulmonary/Chest: Effort normal and breath sounds normal.  Abdominal: Soft. He exhibits no distension. There is tenderness (diffuse). There is guarding.  Genitourinary:     Musculoskeletal: He exhibits no edema.  Neurological: He is alert and oriented to person, place, and time.  Skin: Skin is warm and dry.  Nursing note and vitals reviewed.    ED Treatments / Results  DIAGNOSTIC STUDIES: Oxygen Saturation is 97% on RA, adequate by my  interpretation.    COORDINATION OF CARE: 12:08 AM Discussed treatment plan with pt at bedside which includes rectal exam and pt agreed to plan.  Labs (all labs ordered are listed, but only abnormal results are displayed) Labs Reviewed  COMPREHENSIVE METABOLIC PANEL - Abnormal; Notable for the following:       Result Value   CO2 20 (*)    Glucose, Bld 110 (*)    AST 75 (*)    All other components within normal limits  ETHANOL - Abnormal; Notable for the following:    Alcohol, Ethyl (B) 227 (*)    All other components within normal limits  POC OCCULT BLOOD, ED - Abnormal; Notable for the following:    Fecal Occult Bld POSITIVE (*)    All other components within normal limits  LIPASE, BLOOD  CBC  URINALYSIS, ROUTINE W REFLEX MICROSCOPIC (NOT AT [facility])  PROTIME-INR  CBC  I-STAT CG4 LACTIC ACID, ED  SAMPLE TO BLOOD BANK    EKG  EKG Interpretation None       Radiology Ct Abdomen Pelvis W Contrast  Result Date: 01/04/2016 CLINICAL DATA:  Chronic intermittent abdominal pain, rectal bleeding for 2 weeks. History of colonoscopy and back surgery. EXAM: CT ABDOMEN AND PELVIS WITH CONTRAST TECHNIQUE: Multidetector CT imaging of the abdomen and pelvis was performed using the standard protocol following bolus administration of intravenous contrast. CONTRAST:  1 ISOVUE-300 IOPAMIDOL (ISOVUE-300) INJECTION 61% COMPARISON:  None. FINDINGS: LOWER CHEST: Lung bases are clear. Included heart size is normal. No pericardial effusion. HEPATOBILIARY: Liver and gallbladder are normal. PANCREAS: Normal. SPLEEN: Normal. ADRENALS/URINARY TRACT: Kidneys are orthotopic, demonstrating symmetric enhancement. No nephrolithiasis, hydronephrosis or solid renal masses. The unopacified ureters are normal in course and caliber. Delayed imaging through the kidneys demonstrates symmetric prompt contrast excretion within the proximal urinary collecting system. Urinary bladder is well distended and unremarkable. Normal  adrenal glands. STOMACH/BOWEL: The stomach, small and large bowel are normal in course and caliber without inflammatory changes. Mild rectal wall thickening without inflammation can be seen with internal hemorrhoids. Moderate descending and sigmoid colonic diverticulosis. Short segment of minimal sigmoid pericolonic fat stranding (coronal 25/82). VASCULAR/LYMPHATIC: Aortoiliac vessels are normal in course and caliber, mild calcific atherosclerosis. No lymphadenopathy by CT size criteria. REPRODUCTIVE: Prostate size is normal.  Penile calcifications. OTHER: No intraperitoneal free fluid or free air. MUSCULOSKELETAL: Nonacute. Small fat containing umbilical hernia. Scrotal sac surgical clips. Severe L5-S1 degenerative disc, moderate at L4-5. Severe L5-S1 neural foraminal narrowing. Moderate fat containing umbilical hernia. IMPRESSION: Moderate diverticulosis with short segment of mild likely acute sigmoid diverticulitis without complication. Electronically Signed   By: Awilda Metro M.D.   On: 01/04/2016 02:12    Procedures Procedures (including critical care time)  Medications Ordered in ED Medications  LORazepam (ATIVAN) tablet 1 mg (not administered)    Or  LORazepam (ATIVAN) injection 1 mg (not administered)  thiamine (VITAMIN B-1)  tablet 100 mg (not administered)    Or  thiamine (B-1) injection 100 mg (not administered)  folic acid (FOLVITE) tablet 1 mg (not administered)  multivitamin with minerals tablet 1 tablet (not administered)  divalproex (DEPAKOTE) DR tablet 500 mg (not administered)  traZODone (DESYREL) tablet 50 mg (not administered)  naproxen (NAPROSYN) tablet 500 mg (not administered)  ampicillin-sulbactam (UNASYN) 1.5 g in sodium chloride 0.9 % 50 mL IVPB (not administered)  sodium chloride 0.9 % bolus 1,000 mL (0 mLs Intravenous Stopped 01/04/16 0225)  iopamidol (ISOVUE-300) 61 % injection (  Canceled Entry 01/04/16 0159)  sodium chloride 0.9 % bolus 1,000 mL (1,000 mLs  Intravenous New Bag/Given 01/04/16 0256)  ciprofloxacin (CIPRO) IVPB 400 mg (400 mg Intravenous New Bag/Given 01/04/16 0256)  morphine 4 MG/ML injection 4 mg (4 mg Intravenous Given 01/04/16 0256)     Initial Impression / Assessment and Plan / ED Course  I have reviewed the triage vital signs and the nursing notes.  Pertinent labs & imaging results that were available during my care of the patient were reviewed by me and considered in my medical decision making (see chart for details).  Clinical Course  Comment By Time  Will check labs, CT, give fluids. Rectal exam is occult positive but not grossly positive. He declines narcotics/pain meds due to past history of narcotic abuse Pricilla Loveless, MD 09/23 0011  Given this is probably a diverticular bleed based on CT, Dr. Bosie Clos recommends overnight observation to the hospitalist. Unclear if the segment of diverticulitis is ischemic or infectious. We'll cover with antibiotics and continue fluids. Pricilla Loveless, MD 09/23 [phone]    Dr. Julian Reil to admit to tele obs. Currently stable, not currently bleeding.  Final Clinical Impressions(s) / ED Diagnoses   Final diagnoses:  Diverticulitis of large intestine without perforation or abscess with bleeding   I personally performed the services described in this documentation, which was scribed in my presence. The recorded information has been reviewed and is accurate.   New Prescriptions New Prescriptions   No medications on file     Pricilla Loveless, MD 01/04/16 [phone]

## 2016-01-03 NOTE — ED Triage Notes (Signed)
The pt is c/o diarrhea for 3 weeks he has had rectal  Bleeding dark in color for 3 1/2 weeks  He is also c/o pain. The pt drinks alcohol and he drinks every day

## 2016-01-03 NOTE — ED Notes (Signed)
Pt unable to void at this time. 

## 2016-01-04 ENCOUNTER — Emergency Department (HOSPITAL_COMMUNITY): Payer: PRIVATE HEALTH INSURANCE

## 2016-01-04 ENCOUNTER — Encounter (HOSPITAL_COMMUNITY): Payer: Self-pay | Admitting: Radiology

## 2016-01-04 DIAGNOSIS — F1010 Alcohol abuse, uncomplicated: Secondary | ICD-10-CM

## 2016-01-04 DIAGNOSIS — K5792 Diverticulitis of intestine, part unspecified, without perforation or abscess without bleeding: Secondary | ICD-10-CM | POA: Diagnosis present

## 2016-01-04 DIAGNOSIS — K625 Hemorrhage of anus and rectum: Secondary | ICD-10-CM | POA: Diagnosis present

## 2016-01-04 DIAGNOSIS — K5733 Diverticulitis of large intestine without perforation or abscess with bleeding: Principal | ICD-10-CM

## 2016-01-04 LAB — CBC
HCT: 43 % (ref 39.0–52.0)
HCT: 44.9 % (ref 39.0–52.0)
Hemoglobin: 14.1 g/dL (ref 13.0–17.0)
Hemoglobin: 14.6 g/dL (ref 13.0–17.0)
MCH: 30.2 pg (ref 26.0–34.0)
MCH: 30.3 pg (ref 26.0–34.0)
MCHC: 32.8 g/dL (ref 30.0–36.0)
MCHC: 32.5 g/dL (ref 30.0–36.0)
MCV: 92.1 fL (ref 78.0–100.0)
MCV: 93.2 fL (ref 78.0–100.0)
Platelets: 247 10*3/uL (ref 150–400)
Platelets: 253 10*3/uL (ref 150–400)
RBC: 4.67 MIL/uL (ref 4.22–5.81)
RBC: 4.82 MIL/uL (ref 4.22–5.81)
RDW: 13.7 % (ref 11.5–15.5)
RDW: 13.6 % (ref 11.5–15.5)
WBC: 6 10*3/uL (ref 4.0–10.5)
WBC: 5.8 10*3/uL (ref 4.0–10.5)

## 2016-01-04 LAB — COMPREHENSIVE METABOLIC PANEL
ALT: 60 U/L (ref 17–63)
AST: 75 U/L — ABNORMAL HIGH (ref 15–41)
Albumin: 3.7 g/dL (ref 3.5–5.0)
Alkaline Phosphatase: 99 U/L (ref 38–126)
Anion gap: 10 (ref 5–15)
BUN: 12 mg/dL (ref 6–20)
CO2: 20 mmol/L — ABNORMAL LOW (ref 22–32)
Calcium: 9.3 mg/dL (ref 8.9–10.3)
Chloride: 111 mmol/L (ref 101–111)
Creatinine, Ser: 0.95 mg/dL (ref 0.61–1.24)
GFR calc Af Amer: >60 mL/min (ref >60)
GFR calc non Af Amer: >60 mL/min (ref >60)
Glucose, Bld: 110 mg/dL — ABNORMAL HIGH (ref 65–99)
Potassium: 4.1 mmol/L (ref 3.5–5.1)
Sodium: 141 mmol/L (ref 135–145)
Total Bilirubin: 0.5 mg/dL (ref 0.3–1.2)
Total Protein: 6.7 g/dL (ref 6.5–8.1)

## 2016-01-04 LAB — URINALYSIS, ROUTINE W REFLEX MICROSCOPIC
Bilirubin Urine: NEGATIVE
Glucose, UA: NEGATIVE mg/dL
Hgb urine dipstick: NEGATIVE
Ketones, ur: NEGATIVE mg/dL
Leukocytes, UA: NEGATIVE
Nitrite: NEGATIVE
Protein, ur: NEGATIVE mg/dL
Specific Gravity, Urine: 1.028 (ref 1.005–1.030)
pH: 5 (ref 5.0–8.0)

## 2016-01-04 LAB — ETHANOL: Alcohol, Ethyl (B): 227 mg/dL — ABNORMAL HIGH (ref <5)

## 2016-01-04 LAB — PROTIME-INR
INR: 0.99
Prothrombin Time: 13.1 seconds (ref 11.4–15.2)

## 2016-01-04 LAB — I-STAT CG4 LACTIC ACID, ED: Lactic Acid, Venous: 1.75 mmol/L (ref 0.5–1.9)

## 2016-01-04 LAB — POC OCCULT BLOOD, ED: Fecal Occult Bld: POSITIVE — AB

## 2016-01-04 LAB — LIPASE, BLOOD: Lipase: 37 U/L (ref 11–51)

## 2016-01-04 MED ORDER — TRAZODONE HCL 50 MG PO TABS: 50.0000 mg | ORAL_TABLET | Freq: Every evening | ORAL | Status: DC | PRN

## 2016-01-04 MED ORDER — METRONIDAZOLE IN NACL 5-0.79 MG/ML-% IV SOLN
500.0000 mg | Freq: Once | INTRAVENOUS | Status: DC
  Administered 2016-01-04: 500 mg via INTRAVENOUS
  Filled 2016-01-04: qty 100

## 2016-01-04 MED ORDER — THIAMINE HCL 100 MG/ML IJ SOLN: 100.0000 mg | Freq: Every day | INTRAMUSCULAR | Status: DC

## 2016-01-04 MED ORDER — CIPROFLOXACIN IN D5W 400 MG/200ML IV SOLN
400.0000 mg | Freq: Two times a day (BID) | INTRAVENOUS | Status: DC
  Administered 2016-01-04 – 2016-01-05 (×3): 400 mg via INTRAVENOUS
  Filled 2016-01-04 (×3): qty 200

## 2016-01-04 MED ORDER — ADULT MULTIVITAMIN W/MINERALS CH
1.0000 | ORAL_TABLET | Freq: Every day | ORAL | Status: DC
  Administered 2016-01-04 – 2016-01-05 (×2): 1 via ORAL
  Filled 2016-01-04 (×2): qty 1

## 2016-01-04 MED ORDER — CIPROFLOXACIN HCL 500 MG PO TABS: 500.0000 mg | ORAL_TABLET | Freq: Two times a day (BID) | ORAL | Status: DC

## 2016-01-04 MED ORDER — LORAZEPAM 1 MG PO TABS: 1.0000 mg | ORAL_TABLET | Freq: Four times a day (QID) | ORAL | Status: DC | PRN

## 2016-01-04 MED ORDER — METRONIDAZOLE IN NACL 5-0.79 MG/ML-% IV SOLN
500.0000 mg | Freq: Three times a day (TID) | INTRAVENOUS | Status: DC
  Administered 2016-01-04 – 2016-01-05 (×4): 500 mg via INTRAVENOUS
  Filled 2016-01-04 (×4): qty 100

## 2016-01-04 MED ORDER — METRONIDAZOLE 500 MG PO TABS: 500.0000 mg | ORAL_TABLET | Freq: Three times a day (TID) | ORAL | Status: DC

## 2016-01-04 MED ORDER — LORAZEPAM 2 MG/ML IJ SOLN: 1.0000 mg | Freq: Four times a day (QID) | INTRAMUSCULAR | Status: DC | PRN

## 2016-01-04 MED ORDER — CIPROFLOXACIN IN D5W 400 MG/200ML IV SOLN
400.0000 mg | Freq: Once | INTRAVENOUS | Status: AC
  Administered 2016-01-04: 400 mg via INTRAVENOUS
  Filled 2016-01-04: qty 200

## 2016-01-04 MED ORDER — SODIUM CHLORIDE 0.9 % IV BOLUS (SEPSIS)
1000.0000 mL | Freq: Once | INTRAVENOUS | Status: AC
  Administered 2016-01-04: 1000 mL via INTRAVENOUS

## 2016-01-04 MED ORDER — HYDROCODONE-ACETAMINOPHEN 5-325 MG PO TABS
1.0000 | ORAL_TABLET | Freq: Four times a day (QID) | ORAL | Status: DC | PRN
  Administered 2016-01-04 (×3): 1 via ORAL
  Filled 2016-01-04 (×3): qty 1

## 2016-01-04 MED ORDER — VITAMIN B-1 100 MG PO TABS
100.0000 mg | ORAL_TABLET | Freq: Every day | ORAL | Status: DC
  Administered 2016-01-04 – 2016-01-05 (×2): 100 mg via ORAL
  Filled 2016-01-04 (×2): qty 1

## 2016-01-04 MED ORDER — NAPROXEN 500 MG PO TABS
500.0000 mg | ORAL_TABLET | Freq: Two times a day (BID) | ORAL | Status: DC
  Filled 2016-01-04 (×2): qty 1

## 2016-01-04 MED ORDER — MORPHINE SULFATE (PF) 4 MG/ML IV SOLN
4.0000 mg | Freq: Once | INTRAVENOUS | Status: AC
  Administered 2016-01-04: 4 mg via INTRAVENOUS
  Filled 2016-01-04: qty 1

## 2016-01-04 MED ORDER — SODIUM CHLORIDE 0.9 % IV SOLN
1.5000 g | Freq: Four times a day (QID) | INTRAVENOUS | Status: DC
  Filled 2016-01-04 (×2): qty 1.5

## 2016-01-04 MED ORDER — DIVALPROEX SODIUM 500 MG PO DR TAB
500.0000 mg | DELAYED_RELEASE_TABLET | Freq: Three times a day (TID) | ORAL | Status: DC
  Administered 2016-01-04 – 2016-01-05 (×5): 500 mg via ORAL
  Filled 2016-01-04 (×5): qty 1

## 2016-01-04 MED ORDER — IOPAMIDOL (ISOVUE-300) INJECTION 61%
INTRAVENOUS | Status: AC
  Administered 2016-01-04: 100 mL
  Filled 2016-01-04: qty 100

## 2016-01-04 MED ORDER — ONDANSETRON HCL 4 MG/2ML IJ SOLN
4.0000 mg | Freq: Four times a day (QID) | INTRAMUSCULAR | Status: DC | PRN
  Administered 2016-01-04 – 2016-01-05 (×3): 4 mg via INTRAVENOUS
  Filled 2016-01-04 (×4): qty 2

## 2016-01-04 MED ORDER — FOLIC ACID 1 MG PO TABS
1.0000 mg | ORAL_TABLET | Freq: Every day | ORAL | Status: DC
  Administered 2016-01-04 – 2016-01-05 (×2): 1 mg via ORAL
  Filled 2016-01-04 (×2): qty 1

## 2016-01-04 NOTE — Progress Notes (Signed)
Patient seen and examined this morning, admitted overnight by Dr. Julian ReilGardner, in brief this is a 55 year old male with history of alcohol abuse presented with diarrhea for the past 3-4 weeks, and intermittent blood in his stools. He is not on blood thinners, however uses naproxen at home. CT scan on admission showed diverticulosis and diverticulitis.   Bright red blood per rectum  - Likely diverticular bleed, hemoglobin has remained stable, hold off consult to gastrroenterology due to the presence of diverticulitis as colonoscopy may not be advisable  - Continue to monitor CBC, if significant drop will have to call GI   Diverticulitis  - Place patient on ciprofloxacin and metronidazole   Alcohol abuse  - Continue CIWA, patient denies any history of seizures however states that he is tremulous if he doesn't drink    Costin M. Elvera LennoxGherghe, MD [facility] [phone]

## 2016-01-04 NOTE — Progress Notes (Signed)
Pharmacy Antibiotic Note  Victor Long is a 55 y.o. male admitted on 01/03/2016 with diverticulitis.  Pharmacy has been consulted for Unasyn dosing.  Plan: Unasyn 1.5g IV Q6H.  Weight: 168 lb (76.2 kg)  Temp (24hrs), Avg:98.2 F (36.8 C), Min:98.2 F (36.8 C), Max:98.2 F (36.8 C)   Recent Labs Lab 01/03/16 2244 01/04/16 0037  WBC 6.8  --   CREATININE 0.95  --   LATICACIDVEN  --  1.75     No Known Allergies   Thank you for allowing pharmacy to be a part of this patient's care.  Vernard GamblesVeronda Andrick Rust, PharmD, BCPS  01/04/2016 3:04 AM

## 2016-01-04 NOTE — H&P (Signed)
History and Physical    Sajan Cheatwood WUJ:[id number] DOB: 1960/05/23 DOA: 01/03/2016   PCP: No PCP Per Patient Chief Complaint:  Chief Complaint  Patient presents with  . Rectal Bleeding    HPI: Victor Long is a 55 y.o. male with medical history significant of EtOH abuse.  Patient presents to the ED with c/o watery diarrhea and bowel incontinence with blood onset 4 weeks ago.  This worsened about 2 hours ago with frankly bloody BM which is new.  Not on blood thinners, does have h/o ulcers, no h/o similar symptoms.  ED Course: Diverticulosis and mild diverticulitis on CT scan.  No gross blood on hemoccult but is occult positive, HGB 16.6  Review of Systems: As per HPI otherwise 10 point review of systems negative.    Past Medical History:  Diagnosis Date  . Back pain, chronic   . Bipolar disorder (HCC)   . Depression   . Hypertension   . Sciatica     Past Surgical History:  Procedure Laterality Date  . BACK SURGERY    . SHOULDER SURGERY       reports that he has never smoked. He has never used smokeless tobacco. He reports that he does not drink alcohol or use drugs.  No Known Allergies  Family History  Problem Relation Age of Onset  . Cancer Father       Prior to Admission medications   Medication Sig Start Date End Date Taking? Authorizing Provider  divalproex (DEPAKOTE) 500 MG DR tablet Take 500 mg by mouth 3 (three) times daily.   Yes Historical Provider, MD  naproxen (NAPROSYN) 500 MG tablet Take 1 tablet (500 mg total) by mouth 2 (two) times daily. 09/15/15  Yes Heather Laisure, PA-C  traZODone (DESYREL) 50 MG tablet Take 50 mg by mouth at bedtime as needed for sleep.   Yes Historical Provider, MD    Physical Exam: Vitals:   01/04/16 0225 01/04/16 0230 01/04/16 0245 01/04/16 0300  BP:  114/87 123/84   Pulse:  88 92   Resp:      Temp:      TempSrc:      SpO2:  98% 97%   Weight: 76.2 kg (168 lb)   76.2 kg (167 lb 15.9 oz)  Height:    5\' 5"   (1.651 m)      Constitutional: NAD, calm, comfortable Eyes: PERRL, lids and conjunctivae normal ENMT: Mucous membranes are moist. Posterior pharynx clear of any exudate or lesions.Normal dentition.  Neck: normal, supple, no masses, no thyromegaly Respiratory: clear to auscultation bilaterally, no wheezing, no crackles. Normal respiratory effort. No accessory muscle use.  Cardiovascular: Regular rate and rhythm, no murmurs / rubs / gallops. No extremity edema. 2+ pedal pulses. No carotid bruits.  Abdomen: no tenderness, no masses palpated. No hepatosplenomegaly. Bowel sounds positive.  Musculoskeletal: no clubbing / cyanosis. No joint deformity upper and lower extremities. Good ROM, no contractures. Normal muscle tone.  Skin: no rashes, lesions, ulcers. No induration Neurologic: CN 2-12 grossly intact. Sensation intact, DTR normal. Strength 5/5 in all 4.  Psychiatric: Normal judgment and insight. Alert and oriented x 3. Normal mood.    Labs on Admission: I have personally reviewed following labs and imaging studies  CBC:  Recent Labs Lab 01/03/16 2244  WBC 6.8  HGB 16.6  HCT 48.9  MCV 92.4  PLT 315   Basic Metabolic Panel:  Recent Labs Lab 01/03/16 2244  NA 141  K 4.1  CL 111  CO2 20*  GLUCOSE 110*  BUN 12  CREATININE 0.95  CALCIUM 9.3   GFR: Estimated Creatinine Clearance: 82.8 mL/min (by C-G formula based on SCr of 0.95 mg/dL). Liver Function Tests:  Recent Labs Lab 01/03/16 2244  AST 75*  ALT 60  ALKPHOS 99  BILITOT 0.5  PROT 6.7  ALBUMIN 3.7    Recent Labs Lab 01/03/16 2244  LIPASE 37   No results for input(s): AMMONIA in the last 168 hours. Coagulation Profile:  Recent Labs Lab 01/04/16 0029  INR 0.99   Cardiac Enzymes: No results for input(s): CKTOTAL, CKMB, CKMBINDEX, TROPONINI in the last 168 hours. BNP (last 3 results) No results for input(s): PROBNP in the last 8760 hours. HbA1C: No results for input(s): HGBA1C in the last 72  hours. CBG: No results for input(s): GLUCAP in the last 168 hours. Lipid Profile: No results for input(s): CHOL, HDL, LDLCALC, TRIG, CHOLHDL, LDLDIRECT in the last 72 hours. Thyroid Function Tests: No results for input(s): TSH, T4TOTAL, FREET4, T3FREE, THYROIDAB in the last 72 hours. Anemia Panel: No results for input(s): VITAMINB12, FOLATE, FERRITIN, TIBC, IRON, RETICCTPCT in the last 72 hours. Urine analysis:    Component Value Date/Time   COLORURINE YELLOW 01/04/2016 0202   APPEARANCEUR CLEAR 01/04/2016 0202   LABSPEC 1.028 01/04/2016 0202   PHURINE 5.0 01/04/2016 0202   GLUCOSEU NEGATIVE 01/04/2016 0202   HGBUR NEGATIVE 01/04/2016 0202   BILIRUBINUR NEGATIVE 01/04/2016 0202   KETONESUR NEGATIVE 01/04/2016 0202   PROTEINUR NEGATIVE 01/04/2016 0202   NITRITE NEGATIVE 01/04/2016 0202   LEUKOCYTESUR NEGATIVE 01/04/2016 0202   Sepsis Labs: @LABRCNTIP (procalcitonin:4,lacticidven:4) )No results found for this or any previous visit (from the past 240 hour(s)).   Radiological Exams on Admission: Ct Abdomen Pelvis W Contrast  Result Date: 01/04/2016 CLINICAL DATA:  Chronic intermittent abdominal pain, rectal bleeding for 2 weeks. History of colonoscopy and back surgery. EXAM: CT ABDOMEN AND PELVIS WITH CONTRAST TECHNIQUE: Multidetector CT imaging of the abdomen and pelvis was performed using the standard protocol following bolus administration of intravenous contrast. CONTRAST:  1 ISOVUE-300 IOPAMIDOL (ISOVUE-300) INJECTION 61% COMPARISON:  None. FINDINGS: LOWER CHEST: Lung bases are clear. Included heart size is normal. No pericardial effusion. HEPATOBILIARY: Liver and gallbladder are normal. PANCREAS: Normal. SPLEEN: Normal. ADRENALS/URINARY TRACT: Kidneys are orthotopic, demonstrating symmetric enhancement. No nephrolithiasis, hydronephrosis or solid renal masses. The unopacified ureters are normal in course and caliber. Delayed imaging through the kidneys demonstrates symmetric prompt  contrast excretion within the proximal urinary collecting system. Urinary bladder is well distended and unremarkable. Normal adrenal glands. STOMACH/BOWEL: The stomach, small and large bowel are normal in course and caliber without inflammatory changes. Mild rectal wall thickening without inflammation can be seen with internal hemorrhoids. Moderate descending and sigmoid colonic diverticulosis. Short segment of minimal sigmoid pericolonic fat stranding (coronal 25/82). VASCULAR/LYMPHATIC: Aortoiliac vessels are normal in course and caliber, mild calcific atherosclerosis. No lymphadenopathy by CT size criteria. REPRODUCTIVE: Prostate size is normal.  Penile calcifications. OTHER: No intraperitoneal free fluid or free air. MUSCULOSKELETAL: Nonacute. Small fat containing umbilical hernia. Scrotal sac surgical clips. Severe L5-S1 degenerative disc, moderate at L4-5. Severe L5-S1 neural foraminal narrowing. Moderate fat containing umbilical hernia. IMPRESSION: Moderate diverticulosis with short segment of mild likely acute sigmoid diverticulitis without complication. Electronically Signed   By: Awilda Metroourtnay  Bloomer M.D.   On: 01/04/2016 02:12    EKG: Independently reviewed.  Assessment/Plan Principal Problem:   BRBPR (bright red blood per rectum) Active Problems:   Diverticulitis   ETOH abuse    1.  BRBPR - 1. Likely diverticular bleed 2. Repeat CBC in AM at 0800 3. Tele monitor 2. Diverticulitis - uncomplicated and mild 1. Unasyn 3. EtOH abuse - CIWA   DVT prophylaxis: SCDs Code Status: Full Family Communication: No family in room Consults called: None Admission status: Place in 49, Heywood Iles. DO [facility] Pager [phone] from 7PM-7AM  If 7AM-7PM, please contact the day physician for the patient [email or web address] Password [email or web address]  01/04/2016, 3:22 AM

## 2016-01-05 DIAGNOSIS — K625 Hemorrhage of anus and rectum: Secondary | ICD-10-CM | POA: Diagnosis not present

## 2016-01-05 LAB — CBC
HCT: 46.3 % (ref 39.0–52.0)
HCT: 47.1 % (ref 39.0–52.0)
Hemoglobin: 14.9 g/dL (ref 13.0–17.0)
Hemoglobin: 15.5 g/dL (ref 13.0–17.0)
MCH: 30.2 pg (ref 26.0–34.0)
MCH: 30.9 pg (ref 26.0–34.0)
MCHC: 32.2 g/dL (ref 30.0–36.0)
MCHC: 32.9 g/dL (ref 30.0–36.0)
MCV: 93.7 fL (ref 78.0–100.0)
MCV: 93.8 fL (ref 78.0–100.0)
Platelets: 265 10*3/uL (ref 150–400)
Platelets: 273 10*3/uL (ref 150–400)
RBC: 4.94 MIL/uL (ref 4.22–5.81)
RBC: 5.02 MIL/uL (ref 4.22–5.81)
RDW: 13.6 % (ref 11.5–15.5)
RDW: 13.6 % (ref 11.5–15.5)
WBC: 7.7 10*3/uL (ref 4.0–10.5)
WBC: 6.2 10*3/uL (ref 4.0–10.5)

## 2016-01-05 MED ORDER — METRONIDAZOLE 500 MG PO TABS: 500.0000 mg | ORAL_TABLET | Freq: Three times a day (TID) | ORAL | 0 refills | Status: DC

## 2016-01-05 MED ORDER — ONDANSETRON 4 MG PO TBDP: 4.0000 mg | ORAL_TABLET | Freq: Three times a day (TID) | ORAL | 0 refills | Status: DC | PRN

## 2016-01-05 MED ORDER — CIPROFLOXACIN HCL 500 MG PO TABS: 500.0000 mg | ORAL_TABLET | Freq: Two times a day (BID) | ORAL | 0 refills | Status: DC

## 2016-01-05 NOTE — Care Management Note (Signed)
/  Case Management Note  Patient Details  Name: Victor Long MRN: [id number] Date of Birth: Feb 28, 1961  Subjective/Objective:           Patient was admitted with diverticulitis         Action/Plan: CM met with patient to discussed care transitional planning. Patient reports having difficulty affording medication. Discussed the goodrx medication assistance saving  Program for the uninsured. Patient downloaded app and demonstrated use, checked for his meds and the saving patient states, he can afford cost with coupons. Also patient reports needing to find a PCP offered assistance with follow up at  the [facility] patient is agreeable with establishing care at the clinic. Appt scheduled on 9/28 at 11:30am with Dr. Jarold Song. Updated patient he verbalized understanding teach back done, and no further questions or concerns verbalized.    Expected Discharge Date:01-05-2016                  Expected Discharge Plan:  Home/Self Care  In-House Referral:     Discharge planning Services  CM Consult, Follow-up appt scheduled  Post Acute Care Choice:    Choice offered to:  Patient  DME Arranged:    DME Agency:     HH Arranged:    [place] Agency:     Status of Service:  Completed, signed off  If discussed at [employer] of Stay Meetings, dates discussed:    Additional CommentsLaurena Slimmer, RN 01/05/2016, 11:54 AM

## 2016-01-06 NOTE — Discharge Summary (Signed)
Physician Discharge Summary  Victor Long ZOX:[id number] DOB: 12/08/1960 DOA: 01/03/2016  PCP: No PCP Per Patient  Admit date: 01/03/2016 Discharge date: 01/06/2016  Admitted From: home Disposition:  home  Recommendations for Outpatient Follow-up:  1. Follow up with PCP in 1-2 weeks 2. Continue Ciprofloxacin and Metronidazole for 5 additional days  Home Health: none Equipment/Devices: none  Discharge Condition: stable CODE STATUS: Full Diet recommendation: regular  HPI: Victor FuelJeffery Defranco is a 55 y.o. male with medical history significant of EtOH abuse.  Patient presents to the ED with c/o watery diarrhea and bowel incontinence with blood onset 4 weeks ago.  This worsened about 2 hours ago with frankly bloody BM which is new.  Not on blood thinners, does have h/o ulcers, no h/o similar symptoms.  Hospital Course: Discharge Diagnoses:  Principal Problem:   BRBPR (bright red blood per rectum) Active Problems:   Diverticulitis   ETOH abuse  Bright red blood per rectum - Likely diverticular bleed, bleeding resolved, his hemoglobin has remained stable and will need outpatient follow up for a colonoscopy once his acute diverticulitis is treated.  Diverticulitis - Placed patient on ciprofloxacin and metronidazole, he was NPO and his diet was advanced, patient with improving abdominal pain, able to tolerate a regular diet, was discharged home in stable condition to finish his antibiotics. [facility]. Will need GI referral at that time Alcohol abuse - no significant withdrawal symptoms, strongly advised for cessation   Discharge Instructions     Medication List    STOP taking these medications   naproxen 500 MG tablet Commonly known as:  NAPROSYN     TAKE these medications   ciprofloxacin 500 MG tablet Commonly known as:  CIPRO Take 1 tablet (500 mg total) by mouth 2 (two) times  daily.   divalproex 500 MG DR tablet Commonly known as:  DEPAKOTE Take 500 mg by mouth 3 (three) times daily.   metroNIDAZOLE 500 MG tablet Commonly known as:  FLAGYL Take 1 tablet (500 mg total) by mouth 3 (three) times daily.   ondansetron 4 MG disintegrating tablet Commonly known as:  ZOFRAN ODT Take 1 tablet (4 mg total) by mouth every 8 (eight) hours as needed for nausea or vomiting.   traZODone 50 MG tablet Commonly known as:  DESYREL Take 50 mg by mouth at bedtime as needed for sleep.      Follow-up Information    [facility] Follow up on 01/09/2016.   Why:  [facility] on Thursday 11:30am with Dr. Carleene OverlieAmao Contact information: [address] [place] [id number] [phone]         No Known Allergies  Consultations:  None   Procedures/Studies:  Ct Abdomen Pelvis W Contrast  Result Date: 01/04/2016 CLINICAL DATA:  Chronic intermittent abdominal pain, rectal bleeding for 2 weeks. History of colonoscopy and back surgery. EXAM: CT ABDOMEN AND PELVIS WITH CONTRAST TECHNIQUE: Multidetector CT imaging of the abdomen and pelvis was performed using the standard protocol following bolus administration of intravenous contrast. CONTRAST:  1 ISOVUE-300 IOPAMIDOL (ISOVUE-300) INJECTION 61% COMPARISON:  None. FINDINGS: LOWER CHEST: Lung bases are clear. Included heart size is normal. No pericardial effusion. HEPATOBILIARY: Liver and gallbladder are normal. PANCREAS: Normal. SPLEEN: Normal. ADRENALS/URINARY TRACT: Kidneys are orthotopic, demonstrating symmetric enhancement. No nephrolithiasis, hydronephrosis or solid renal masses. The unopacified ureters are normal  in course and caliber. Delayed imaging through the kidneys demonstrates symmetric prompt contrast excretion within the proximal urinary collecting system. Urinary bladder is well distended and unremarkable. Normal adrenal  glands. STOMACH/BOWEL: The stomach, small and large bowel are normal in course and caliber without inflammatory changes. Mild rectal wall thickening without inflammation can be seen with internal hemorrhoids. Moderate descending and sigmoid colonic diverticulosis. Short segment of minimal sigmoid pericolonic fat stranding (coronal 25/82). VASCULAR/LYMPHATIC: Aortoiliac vessels are normal in course and caliber, mild calcific atherosclerosis. No lymphadenopathy by CT size criteria. REPRODUCTIVE: Prostate size is normal.  Penile calcifications. OTHER: No intraperitoneal free fluid or free air. MUSCULOSKELETAL: Nonacute. Small fat containing umbilical hernia. Scrotal sac surgical clips. Severe L5-S1 degenerative disc, moderate at L4-5. Severe L5-S1 neural foraminal narrowing. Moderate fat containing umbilical hernia. IMPRESSION: Moderate diverticulosis with short segment of mild likely acute sigmoid diverticulitis without complication. Electronically Signed   By: Awilda Metro M.D.   On: 01/04/2016 02:12      Subjective: - no chest pain, shortness of breath, no abdominal pain, nausea or vomiting.   Discharge Exam: Vitals:   01/05/16 0928 01/05/16 1740  BP: 116/72 120/64  Pulse: 62 75  Resp: 18 18  Temp: 97.9 F (36.6 C) 98 F (36.7 C)   Vitals:   01/04/16 2112 01/05/16 0416 01/05/16 0928 01/05/16 1740  BP: 120/80 110/66 116/72 120/64  Pulse: 80 74 62 75  Resp: 18 18 18 18   Temp: 98.3 F (36.8 C) 97.9 F (36.6 C) 97.9 F (36.6 C) 98 F (36.7 C)  TempSrc: Oral Oral Oral Oral  SpO2: 97% 97% 97% 97%  Weight: 75.6 kg (166 lb 10.7 oz)     Height:        General: Pt is alert, awake, not in acute distress Cardiovascular: RRR, S1/S2 +, no rubs, no gallops Respiratory: CTA bilaterally, no wheezing, no rhonchi Abdominal: Soft, NT, ND, bowel sounds + Extremities: no edema, no cyanosis    The results of significant diagnostics from this hospitalization (including imaging, microbiology,  ancillary and laboratory) are listed below for reference.     Microbiology: No results found for this or any previous visit (from the past 240 hour(s)).   Labs: BNP (last 3 results) No results for input(s): BNP in the last 8760 hours. Basic Metabolic Panel:  Recent Labs Lab 01/03/16 2244  NA 141  K 4.1  CL 111  CO2 20*  GLUCOSE 110*  BUN 12  CREATININE 0.95  CALCIUM 9.3   Liver Function Tests:  Recent Labs Lab 01/03/16 2244  AST 75*  ALT 60  ALKPHOS 99  BILITOT 0.5  PROT 6.7  ALBUMIN 3.7    Recent Labs Lab 01/03/16 2244  LIPASE 37   No results for input(s): AMMONIA in the last 168 hours. CBC:  Recent Labs Lab 01/03/16 2244 01/04/16 0718 01/04/16 1612 01/05/16 0430 01/05/16 1643  WBC 6.8 6.0 5.8 7.7 6.2  HGB 16.6 14.1 14.6 14.9 15.5  HCT 48.9 43.0 44.9 46.3 47.1  MCV 92.4 92.1 93.2 93.7 93.8  PLT 315 247 253 265 273   Cardiac Enzymes: No results for input(s): CKTOTAL, CKMB, CKMBINDEX, TROPONINI in the last 168 hours. BNP: Invalid input(s): POCBNP CBG: No results for input(s): GLUCAP in the last 168 hours. D-Dimer No results for input(s): DDIMER in the last 72 hours. Hgb A1c No results for input(s): HGBA1C in the last 72 hours. Lipid Profile No results for input(s): CHOL, HDL, LDLCALC, TRIG, CHOLHDL, LDLDIRECT in the last 72 hours.  Thyroid function studies No results for input(s): TSH, T4TOTAL, T3FREE, THYROIDAB in the last 72 hours.  Invalid input(s): FREET3 Anemia work up No results for input(s): VITAMINB12, FOLATE, FERRITIN, TIBC, IRON, RETICCTPCT in the last 72 hours. Urinalysis    Component Value Date/Time   COLORURINE YELLOW 01/04/2016 0202   APPEARANCEUR CLEAR 01/04/2016 0202   LABSPEC 1.028 01/04/2016 0202   PHURINE 5.0 01/04/2016 0202   GLUCOSEU NEGATIVE 01/04/2016 0202   HGBUR NEGATIVE 01/04/2016 0202   BILIRUBINUR NEGATIVE 01/04/2016 0202   KETONESUR NEGATIVE 01/04/2016 0202   PROTEINUR NEGATIVE 01/04/2016 0202   NITRITE  NEGATIVE 01/04/2016 0202   LEUKOCYTESUR NEGATIVE 01/04/2016 0202   Sepsis Labs Invalid input(s): PROCALCITONIN,  WBC,  LACTICIDVEN Microbiology No results found for this or any previous visit (from the past 240 hour(s)).   Time coordinating discharge: Over 30 minutes  SIGNED:  Pamella Pert, MD  [facility] 01/06/2016, 9:39 AM Pager [phone]  If 7PM-7AM, please contact night-coverage [email or web address] Password [email or web address]

## 2016-01-09 ENCOUNTER — Ambulatory Visit: Payer: PRIVATE HEALTH INSURANCE | Attending: Family Medicine | Admitting: Family Medicine

## 2016-01-09 ENCOUNTER — Encounter: Payer: Self-pay | Admitting: Family Medicine

## 2016-01-09 VITALS — BP 129/83 | HR 78 | Temp 97.6°F | Ht 65.25 in | Wt 171.0 lb

## 2016-01-09 DIAGNOSIS — F3177 Bipolar disorder, in partial remission, most recent episode mixed: Secondary | ICD-10-CM

## 2016-01-09 DIAGNOSIS — Z23 Encounter for immunization: Secondary | ICD-10-CM | POA: Diagnosis not present

## 2016-01-09 DIAGNOSIS — F319 Bipolar disorder, unspecified: Secondary | ICD-10-CM | POA: Insufficient documentation

## 2016-01-09 DIAGNOSIS — Z79899 Other long term (current) drug therapy: Secondary | ICD-10-CM | POA: Insufficient documentation

## 2016-01-09 DIAGNOSIS — K5733 Diverticulitis of large intestine without perforation or abscess with bleeding: Secondary | ICD-10-CM

## 2016-01-09 DIAGNOSIS — K5900 Constipation, unspecified: Secondary | ICD-10-CM | POA: Insufficient documentation

## 2016-01-09 DIAGNOSIS — F419 Anxiety disorder, unspecified: Secondary | ICD-10-CM | POA: Insufficient documentation

## 2016-01-09 DIAGNOSIS — G4700 Insomnia, unspecified: Secondary | ICD-10-CM | POA: Diagnosis not present

## 2016-01-09 DIAGNOSIS — K219 Gastro-esophageal reflux disease without esophagitis: Secondary | ICD-10-CM | POA: Insufficient documentation

## 2016-01-09 DIAGNOSIS — K5732 Diverticulitis of large intestine without perforation or abscess without bleeding: Secondary | ICD-10-CM | POA: Insufficient documentation

## 2016-01-09 LAB — CBC WITH DIFFERENTIAL/PLATELET
Basophils Absolute: 0 cells/uL (ref 0–200)
Basophils Relative: 0 %
Eosinophils Absolute: 180 cells/uL (ref 15–500)
Eosinophils Relative: 4 %
HCT: 47.7 % (ref 38.5–50.0)
Hemoglobin: 16.5 g/dL (ref 13.2–17.1)
Lymphocytes Relative: 34 %
Lymphs Abs: 1530 cells/uL (ref 850–3900)
MCH: 31 pg (ref 27.0–33.0)
MCHC: 34.6 g/dL (ref 32.0–36.0)
MCV: 89.5 fL (ref 80.0–100.0)
MPV: 10.4 fL (ref 7.5–12.5)
Monocytes Absolute: 720 cells/uL (ref 200–950)
Monocytes Relative: 16 %
Neutro Abs: 2070 cells/uL (ref 1500–7800)
Neutrophils Relative %: 46 %
Platelets: 288 10*3/uL (ref 140–400)
RBC: 5.33 MIL/uL (ref 4.20–5.80)
RDW: 13.3 % (ref 11.0–15.0)
WBC: 4.5 10*3/uL (ref 3.8–10.8)

## 2016-01-09 MED ORDER — TRAZODONE HCL 50 MG PO TABS: 50.0000 mg | ORAL_TABLET | Freq: Every evening | ORAL | 2 refills | Status: DC | PRN

## 2016-01-09 MED FILL — ?TRAZODONE 100 MG TABLET: 100 | 30 days supply | Qty: 15 | Fill #0

## 2016-01-09 NOTE — Patient Instructions (Signed)
Diverticulitis °Diverticulitis is inflammation or infection of small pouches in your colon that form when you have a condition called diverticulosis. The pouches in your colon are called diverticula. Your colon, or large intestine, is where water is absorbed and stool is formed. °Complications of diverticulitis can include: °· Bleeding. °· Severe infection. °· Severe pain. °· Perforation of your colon. °· Obstruction of your colon. °CAUSES  °Diverticulitis is caused by bacteria. °Diverticulitis happens when stool becomes trapped in diverticula. This allows bacteria to grow in the diverticula, which can lead to inflammation and infection. °RISK FACTORS °People with diverticulosis are at risk for diverticulitis. Eating a diet that does not include enough fiber from fruits and vegetables may make diverticulitis more likely to develop. °SYMPTOMS  °Symptoms of diverticulitis may include: °· Abdominal pain and tenderness. The pain is normally located on the left side of the abdomen, but may occur in other areas. °· Fever and chills. °· Bloating. °· Cramping. °· Nausea. °· Vomiting. °· Constipation. °· Diarrhea. °· Blood in your stool. °DIAGNOSIS  °Your health care provider will ask you about your medical history and do a physical exam. You may need to have tests done because many medical conditions can cause the same symptoms as diverticulitis. Tests may include: °· Blood tests. °· Urine tests. °· Imaging tests of the abdomen, including X-rays and CT scans. °When your condition is under control, your health care provider may recommend that you have a colonoscopy. A colonoscopy can show how severe your diverticula are and whether something else is causing your symptoms. °TREATMENT  °Most cases of diverticulitis are mild and can be treated at home. Treatment may include: °· Taking over-the-counter pain medicines. °· Following a clear liquid diet. °· Taking antibiotic medicines by mouth for 7-10 days. °More severe cases may  be treated at a hospital. Treatment may include: °· Not eating or drinking. °· Taking prescription pain medicine. °· Receiving antibiotic medicines through an IV tube. °· Receiving fluids and nutrition through an IV tube. °· Surgery. °HOME CARE INSTRUCTIONS  °· Follow your health care provider's instructions carefully. °· Follow a full liquid diet or other diet as directed by your health care provider. After your symptoms improve, your health care provider may tell you to change your diet. He or she may recommend you eat a high-fiber diet. Fruits and vegetables are good sources of fiber. Fiber makes it easier to pass stool. °· Take fiber supplements or probiotics as directed by your health care provider. °· Only take medicines as directed by your health care provider. °· Keep all your follow-up appointments. °SEEK MEDICAL CARE IF:  °· Your pain does not improve. °· You have a hard time eating food. °· Your bowel movements do not return to normal. °SEEK IMMEDIATE MEDICAL CARE IF:  °· Your pain becomes worse. °· Your symptoms do not get better. °· Your symptoms suddenly get worse. °· You have a fever. °· You have repeated vomiting. °· You have bloody or black, tarry stools. °MAKE SURE YOU:  °· Understand these instructions. °· Will watch your condition. °· Will get help right away if you are not doing well or get worse. °  °This information is not intended to replace advice given to you by your health care provider. Make sure you discuss any questions you have with your health care provider. °  °Document Released: 01/07/2005 Document Revised: 04/04/2013 Document Reviewed: 02/22/2013 °Elsevier Interactive Patient Education ©2016 [employer] ° °

## 2016-01-09 NOTE — Progress Notes (Signed)
Subjective:  Patient ID: Victor Long, male    DOB: 24-Mar-1961  Age: 55 y.o. MRN: [id number]  CC: Hospitalization Follow-up (diverticulitis); Sciatica; Gastroesophageal Reflux; Manic Behavior; and Constipation   HPI Victor Long is a 55 year old male with a history of bipolar disorder who presents today to establish care after hospitalization at [facility] from 01/03/16 through 01/05/16 for acute diverticulitis. He had presented with bright red blood per rectum, diarrhea and hemoglobin was stable at 16.6. CT abdomen and pelvis with contrast revealed moderate diverticulosis with short segment of mild likely acute sigmoid diverticulitis without complication. He was placed on ciprofloxacin and metronidazole and nothing by mouth and had a gradual advancement of diet as his condition improved. Discharge hemoglobin was 15.5.  He has mild dull abdominal pain at this time but denies diarrhea or hematochezia; last colonoscopy was 3 years ago and he informs me there were findings of benign polyps. He does have some mild depression which she says is situation I'll-due to the fact that he lives with his mother and has a "bills to pay on a limited income.  Bipolar disorder is followed by his psychiatrist on [facility] whom he last saw 6 months ago (due to inability to afford his co-pay at [facility]); he is requesting a refill of his trazodone which she receives for insomnia.  Outpatient Medications Prior to Visit  Medication Sig Dispense Refill  . ciprofloxacin (CIPRO) 500 MG tablet Take 1 tablet (500 mg total) by mouth 2 (two) times daily. 10 tablet 0  . divalproex (DEPAKOTE) 500 MG DR tablet Take 500 mg by mouth 3 (three) times daily.    . metroNIDAZOLE (FLAGYL) 500 MG tablet Take 1 tablet (500 mg total) by mouth 3 (three) times daily. 15 tablet 0  . ondansetron (ZOFRAN ODT) 4 MG disintegrating tablet Take 1 tablet (4 mg total) by mouth every 8 (eight) hours as needed for nausea or vomiting.  (Patient not taking: Reported on 01/09/2016) 20 tablet 0  . traZODone (DESYREL) 50 MG tablet Take 50 mg by mouth at bedtime as needed for sleep.     No facility-administered medications prior to visit.     ROS Review of Systems  Constitutional: Negative for activity change and appetite change.  HENT: Negative for sinus pressure and sore throat.   Eyes: Negative for visual disturbance.  Respiratory: Negative for cough, chest tightness and shortness of breath.   Cardiovascular: Negative for chest pain and leg swelling.  Gastrointestinal: Positive for abdominal pain. Negative for abdominal distention, constipation and diarrhea.  Endocrine: Negative.   Genitourinary: Negative for dysuria.  Musculoskeletal: Negative for joint swelling and myalgias.  Skin: Negative for rash.  Allergic/Immunologic: Negative.   Neurological: Negative for weakness, light-headedness and numbness.  Psychiatric/Behavioral: Positive for dysphoric mood. Negative for suicidal ideas.    Objective:  BP 129/83 (BP Location: Right Arm, Patient Position: Sitting, Cuff Size: Large)   Pulse 78   Temp 97.6 F (36.4 C) (Oral)   Ht 5' 5.25" (1.657 m)   Wt 171 lb (77.6 kg)   SpO2 99%   BMI 28.24 kg/m   BP/Weight 01/09/2016 01/05/2016 01/04/2016  Systolic BP 129 120 -  Diastolic BP 83 64 -  Wt. (Lbs) 171 - 166.67  BMI 28.24 - 27.73      Physical Exam  Constitutional: He is oriented to person, place, and time. He appears well-developed and well-nourished.  Cardiovascular: Normal rate, normal heart sounds and intact distal pulses.   No murmur heard. Pulmonary/Chest: Effort normal  and breath sounds normal. He has no wheezes. He has no rales. He exhibits no tenderness.  Abdominal: Soft. Bowel sounds are normal. He exhibits no distension and no mass. There is tenderness (mild left lower quadrant tenderness).  Musculoskeletal: Normal range of motion.  Neurological: He is alert and oriented to person, place, and time.      Assessment & Plan:   1. Bipolar disorder, in partial remission, most recent episode mixed (HCC) PH Q9 reveals moderate depression and anxiety I have encouraged him [facility] Depakote  2. Diverticulitis of large intestine without perforation or abscess with bleeding Complete course of Cipro and metronidazole We'll refer to GI at next visit given findings of polyps on previous colonoscopy - CBC with Differential/Platelet  3. Insomnia Refill trazodone  4. Encounter for immunization - Flu Vaccine QUAD 36+ mos IM   Meds ordered this encounter  Medications  . traZODone (DESYREL) 50 MG tablet    Sig: Take 1 tablet (50 mg total) by mouth at bedtime as needed for sleep.    Dispense:  30 tablet    Refill:  2    Follow-up: Return in about 3 weeks (around 01/30/2016) for For follow-up of diverticulitis.   Jaclyn ShaggyEnobong Amao MD

## 2016-01-09 NOTE — Progress Notes (Signed)
Last BM 4 days ago Medication refill- trazodone PHQ-9  #16 GAD-& #12

## 2016-01-10 ENCOUNTER — Telehealth: Payer: Self-pay

## 2016-01-10 NOTE — Telephone Encounter (Signed)
-----   Message from Jaclyn ShaggyEnobong Amao, MD sent at 01/10/2016  8:56 AM EDT ----- Please inform the patient that labs are normal. Thank you.

## 2016-01-10 NOTE — Telephone Encounter (Signed)
Writer called and LVM with patient to call back regarding his lab results.

## 2016-01-13 ENCOUNTER — Telehealth: Payer: Self-pay

## 2016-01-13 NOTE — Telephone Encounter (Signed)
-----   Message from Victor Amao, MD sent at 01/10/2016  8:56 AM EDT ----- Please inform the patient that labs are normal. Thank you. 

## 2016-01-13 NOTE — Telephone Encounter (Signed)
Per Dr. Venetia NightAmao patient given his lab results and stated understanding. Patient also is hoping to "get some pain medication today for his back".  Patient is no longer constipated.

## 2016-01-14 MED ORDER — METHOCARBAMOL 500 MG PO TABS: 500.0000 mg | ORAL_TABLET | Freq: Three times a day (TID) | ORAL | 1 refills | Status: DC | PRN

## 2016-01-14 NOTE — Addendum Note (Signed)
Addended by: Jaclyn ShaggyAMAO, Pardeep Pautz on: 01/14/2016 10:41 AM   Modules accepted: Orders

## 2016-01-14 NOTE — Telephone Encounter (Signed)
Prescription for Robaxin sent to pharmacy.

## 2016-01-14 NOTE — Telephone Encounter (Signed)
Writer called patient to inform him that MD sent a prescription to his [facility] for pain- LVM on patient's cell.

## 2016-02-11 ENCOUNTER — Emergency Department (HOSPITAL_COMMUNITY): Payer: Self-pay

## 2016-02-11 ENCOUNTER — Encounter (HOSPITAL_COMMUNITY): Payer: Self-pay | Admitting: Emergency Medicine

## 2016-02-11 ENCOUNTER — Emergency Department (HOSPITAL_COMMUNITY)
Admission: EM | Admit: 2016-02-11 | Discharge: 2016-02-11 | Disposition: A | Discharge status: Home / Self Care | Payer: Self-pay | Attending: Emergency Medicine | Admitting: Emergency Medicine

## 2016-02-11 DIAGNOSIS — K5792 Diverticulitis of intestine, part unspecified, without perforation or abscess without bleeding: Secondary | ICD-10-CM | POA: Insufficient documentation

## 2016-02-11 DIAGNOSIS — R1084 Generalized abdominal pain: Secondary | ICD-10-CM

## 2016-02-11 DIAGNOSIS — Z79899 Other long term (current) drug therapy: Secondary | ICD-10-CM | POA: Insufficient documentation

## 2016-02-11 DIAGNOSIS — I10 Essential (primary) hypertension: Secondary | ICD-10-CM | POA: Insufficient documentation

## 2016-02-11 HISTORY — DX: Diverticulitis of intestine, part unspecified, without perforation or abscess without bleeding: K57.92

## 2016-02-11 LAB — URINALYSIS, ROUTINE W REFLEX MICROSCOPIC
Bilirubin Urine: NEGATIVE
Glucose, UA: NEGATIVE mg/dL
Hgb urine dipstick: NEGATIVE
Ketones, ur: NEGATIVE mg/dL
Leukocytes, UA: NEGATIVE
Nitrite: NEGATIVE
Protein, ur: NEGATIVE mg/dL
Specific Gravity, Urine: 1.015 (ref 1.005–1.030)
pH: 7.5 (ref 5.0–8.0)

## 2016-02-11 LAB — CBC
HCT: 47.2 % (ref 39.0–52.0)
Hemoglobin: 16 g/dL (ref 13.0–17.0)
MCH: 30.9 pg (ref 26.0–34.0)
MCHC: 33.9 g/dL (ref 30.0–36.0)
MCV: 91.3 fL (ref 78.0–100.0)
Platelets: 228 10*3/uL (ref 150–400)
RBC: 5.17 MIL/uL (ref 4.22–5.81)
RDW: 13.2 % (ref 11.5–15.5)
WBC: 9.1 10*3/uL (ref 4.0–10.5)

## 2016-02-11 LAB — COMPREHENSIVE METABOLIC PANEL
ALT: 38 U/L (ref 17–63)
AST: 45 U/L — ABNORMAL HIGH (ref 15–41)
Albumin: 3.5 g/dL (ref 3.5–5.0)
Alkaline Phosphatase: 83 U/L (ref 38–126)
Anion gap: 11 (ref 5–15)
BUN: 6 mg/dL (ref 6–20)
CO2: 22 mmol/L (ref 22–32)
Calcium: 9.1 mg/dL (ref 8.9–10.3)
Chloride: 104 mmol/L (ref 101–111)
Creatinine, Ser: 1.06 mg/dL (ref 0.61–1.24)
GFR calc Af Amer: >60 mL/min (ref >60)
GFR calc non Af Amer: >60 mL/min (ref >60)
Glucose, Bld: 136 mg/dL — ABNORMAL HIGH (ref 65–99)
Potassium: 3.7 mmol/L (ref 3.5–5.1)
Sodium: 137 mmol/L (ref 135–145)
Total Bilirubin: 1.1 mg/dL (ref 0.3–1.2)
Total Protein: 6.2 g/dL — ABNORMAL LOW (ref 6.5–8.1)

## 2016-02-11 LAB — LIPASE, BLOOD: Lipase: 27 U/L (ref 11–51)

## 2016-02-11 MED ORDER — HYDROCODONE-ACETAMINOPHEN 5-325 MG PO TABS: 1.0000 | ORAL_TABLET | Freq: Four times a day (QID) | ORAL | 0 refills | Status: DC | PRN

## 2016-02-11 MED ORDER — CIPROFLOXACIN HCL 500 MG PO TABS: 500.0000 mg | ORAL_TABLET | Freq: Two times a day (BID) | ORAL | 0 refills | Status: DC

## 2016-02-11 MED ORDER — METRONIDAZOLE 500 MG PO TABS
500.0000 mg | ORAL_TABLET | Freq: Once | ORAL | Status: AC
  Administered 2016-02-11: 500 mg via ORAL
  Filled 2016-02-11: qty 1

## 2016-02-11 MED ORDER — IOPAMIDOL (ISOVUE-300) INJECTION 61%
INTRAVENOUS | Status: AC
  Administered 2016-02-11: 100 mL
  Filled 2016-02-11: qty 100

## 2016-02-11 MED ORDER — MORPHINE SULFATE (PF) 4 MG/ML IV SOLN
4.0000 mg | Freq: Once | INTRAVENOUS | Status: AC
  Administered 2016-02-11: 4 mg via INTRAVENOUS
  Filled 2016-02-11: qty 1

## 2016-02-11 MED ORDER — ONDANSETRON HCL 4 MG PO TABS: 4.0000 mg | ORAL_TABLET | Freq: Three times a day (TID) | ORAL | 0 refills | Status: DC | PRN

## 2016-02-11 MED ORDER — ONDANSETRON HCL 4 MG/2ML IJ SOLN
4.0000 mg | Freq: Once | INTRAMUSCULAR | Status: AC
  Administered 2016-02-11: 4 mg via INTRAVENOUS
  Filled 2016-02-11: qty 2

## 2016-02-11 MED ORDER — SODIUM CHLORIDE 0.9 % IV BOLUS (SEPSIS)
1000.0000 mL | Freq: Once | INTRAVENOUS | Status: AC
  Administered 2016-02-11: 1000 mL via INTRAVENOUS

## 2016-02-11 MED ORDER — CIPROFLOXACIN HCL 500 MG PO TABS
750.0000 mg | ORAL_TABLET | Freq: Once | ORAL | Status: AC
  Administered 2016-02-11: 750 mg via ORAL
  Filled 2016-02-11: qty 2

## 2016-02-11 MED ORDER — METRONIDAZOLE 500 MG PO TABS: 500.0000 mg | ORAL_TABLET | Freq: Two times a day (BID) | ORAL | 0 refills | Status: DC

## 2016-02-11 NOTE — ED Triage Notes (Signed)
Pt c/o abdominal pain since last night, generalized abdominal pain, with N/V. Tender to palpation.

## 2016-02-11 NOTE — ED Notes (Signed)
Pt ambulated to and from restroom; tolerated well 

## 2016-02-11 NOTE — ED Provider Notes (Signed)
[facility]-EMERGENCY DEPT Provider Note   CSN: [id number] Arrival date & time: 02/11/16  1302     History   Chief Complaint Chief Complaint  Patient presents with  . Abdominal Pain    HPI Victor Long is a 55 y.o. male.  Patient presents with "seventeen hours" of lower abdominal pain, started acutely after he was walking his dog today. Denies fevers. Reports the pain has been worsening in intensity. Has not recently been ill. Denies constipation and states he has has chronically loose stools but no worse today. Feels nauseated but has not had vomiting. Poor appetite. No recent sick contacts or international travel.   The history is provided by the patient. No language interpreter was used.  Abdominal Pain   This is a new problem. The current episode started 12 to 24 hours ago. The problem occurs constantly. The problem has been gradually worsening. The pain is associated with an unknown factor. The pain is located in the LLQ, suprapubic region and RLQ. The pain is at a severity of 8/10. The pain is moderate. Associated symptoms include anorexia and nausea. Pertinent negatives include fever, vomiting and dysuria. The symptoms are aggravated by palpation. Nothing relieves the symptoms. Past workup does not include CT scan or ultrasound.    Past Medical History:  Diagnosis Date  . Back pain, chronic   . Bipolar disorder (HCC)   . Depression   . Diverticulitis   . Hypertension   . Sciatica     Patient Active Problem List   Diagnosis Date Noted  . Bipolar disorder (HCC) 01/09/2016  . Insomnia 01/09/2016  . Diverticulitis 01/04/2016  . BRBPR (bright red blood per rectum) 01/04/2016  . ETOH abuse 01/04/2016    Past Surgical History:  Procedure Laterality Date  . BACK SURGERY    . SHOULDER SURGERY         Home Medications    Prior to Admission medications   Medication Sig Start Date End Date Taking? Authorizing Provider  divalproex (DEPAKOTE) 500 MG DR tablet Take  500 mg by mouth 3 (three) times daily.   Yes Historical Provider, MD  methocarbamol (ROBAXIN) 500 MG tablet Take 1 tablet (500 mg total) by mouth every 8 (eight) hours as needed for muscle spasms. 01/14/16  Yes Jaclyn ShaggyEnobong Amao, MD  Misc Natural Products (GINSENG-KOREAN PO) Take by mouth See admin instructions. Drink 4.5 ounces by mouth four to five times a day   Yes Historical Provider, MD  naproxen (NAPROSYN) 500 MG tablet Take 500 mg by mouth daily as needed (for back pain).   Yes Historical Provider, MD  traZODone (DESYREL) 50 MG tablet Take 1 tablet (50 mg total) by mouth at bedtime as needed for sleep. 01/09/16  Yes Jaclyn ShaggyEnobong Amao, MD  Witch Hazel (PREPARATION H EX) Apply 1 application topically 6 (six) times daily.   Yes Historical Provider, MD  ciprofloxacin (CIPRO) 500 MG tablet Take 1 tablet (500 mg total) by mouth 2 (two) times daily. 02/11/16   Preston FleetingAnna Preslee Regas, MD  HYDROcodone-acetaminophen (NORCO/VICODIN) 5-325 MG tablet Take 1-2 tablets by mouth every 6 (six) hours as needed for severe pain. 02/11/16   Preston FleetingAnna Tsion Inghram, MD  metroNIDAZOLE (FLAGYL) 500 MG tablet Take 1 tablet (500 mg total) by mouth 2 (two) times daily. 02/11/16   Preston FleetingAnna Jah Alarid, MD  ondansetron (ZOFRAN) 4 MG tablet Take 1 tablet (4 mg total) by mouth every 8 (eight) hours as needed for nausea or vomiting. 02/11/16   Preston FleetingAnna Evalee Gerard, MD    Family History Family  History  Problem Relation Age of Onset  . Cancer Father     Social History Social History  Substance Use Topics  . Smoking status: Never Smoker  . Smokeless tobacco: Current User    Types: Chew  . Alcohol use 3.6 - 7.2 oz/week    6 - 12 Cans of beer per week     Allergies   Review of patient's allergies indicates no known allergies.   Review of Systems Review of Systems  Constitutional: Negative for fever.  HENT: Negative.   Respiratory: Negative for shortness of breath.   Cardiovascular: Negative for chest pain.  Gastrointestinal: Positive for abdominal  pain, anorexia and nausea. Negative for blood in stool and vomiting.  Genitourinary: Negative for dysuria.  Musculoskeletal: Negative.   Skin: Negative.   Neurological: Negative.   Hematological: Does not bruise/bleed easily.  Psychiatric/Behavioral: Negative.      Physical Exam Updated Vital Signs BP 127/98   Pulse 99   Temp 98.3 F (36.8 C) (Oral)   Resp 16   SpO2 99%   Physical Exam  Constitutional: He is oriented to person, place, and time. He appears well-developed and well-nourished. No distress.  HENT:  Head: Normocephalic and atraumatic.  Eyes: Conjunctivae and EOM are normal. Pupils are equal, round, and reactive to light. No scleral icterus.  Neck: Normal range of motion. Neck supple.  Cardiovascular: Normal rate, regular rhythm, normal heart sounds and intact distal pulses.  Exam reveals no gallop and no friction rub.   No murmur heard. Pulmonary/Chest: Effort normal and breath sounds normal. No respiratory distress. He has no wheezes. He has no rales.  Abdominal: Soft. Bowel sounds are normal. There is tenderness in the suprapubic area and left lower quadrant. There is guarding. There is no rigidity, no rebound, no CVA tenderness, no tenderness at McBurney's point and negative Murphy's sign.  Neurological: He is alert and oriented to person, place, and time.  Skin: Skin is warm and dry. He is not diaphoretic.  Psychiatric: He has a normal mood and affect. His behavior is normal. Judgment and thought content normal.     ED Treatments / Results  Labs (all labs ordered are listed, but only abnormal results are displayed) Labs Reviewed  COMPREHENSIVE METABOLIC PANEL - Abnormal; Notable for the following:       Result Value   Glucose, Bld 136 (*)    Total Protein 6.2 (*)    AST 45 (*)    All other components within normal limits  LIPASE, BLOOD  CBC  URINALYSIS, ROUTINE W REFLEX MICROSCOPIC (NOT AT [facility])    EKG  EKG Interpretation None        Radiology Ct Abdomen Pelvis W Contrast  Result Date: 02/11/2016 CLINICAL DATA:  Abdominal pain EXAM: CT ABDOMEN AND PELVIS WITH CONTRAST TECHNIQUE: Multidetector CT imaging of the abdomen and pelvis was performed using the standard protocol following bolus administration of intravenous contrast. CONTRAST:  1 ISOVUE-300 IOPAMIDOL (ISOVUE-300) INJECTION 61% COMPARISON:  01/04/2016 FINDINGS: Lower chest: Lung bases demonstrate mild dependent atelectasis. No acute consolidation or pleural effusion. Hepatobiliary: Stable 9 mm hypodense liver lesion anteriorly. No calcified gallstones. No biliary dilatation. Pancreas: Unremarkable. No pancreatic ductal dilatation or surrounding inflammatory changes. Spleen: Normal in size without focal abnormality. Adrenals/Urinary Tract: Adrenal glands are unremarkable. Kidneys are normal, without renal calculi, focal lesion, or hydronephrosis. Bladder is unremarkable. Stomach/Bowel: The stomach is nondilated. No dilated small or large bowel. Colonic diverticular disease. There is mild to moderate edema and inflammation surrounding the  sigmoid colon distally, consistent with acute diverticulitis. No abscess or perforation. Appendix upper normal in size at 7 mm, but no associated inflammation. Vascular/Lymphatic: Atherosclerosis of the aorta. No aneurysm. No pathologically enlarged lymph nodes. Reproductive: Small calcification in the prostate gland Other: No free air or free fluid. Moderate fatty containing umbilical hernia. Musculoskeletal: Stable sclerotic foci within the pelvic bones. Degenerative changes of the spine. No acute osseous abnormality. IMPRESSION: 1. Mild to moderate edema surrounding the distal sigmoid colon with diverticula present, consistent with acute diverticulitis. No perforation or abscess. 2. Otherwise no acute intra-abdominal or pelvic pathology. Electronically Signed   By: Jasmine PangKim  Fujinaga M.D.   On: 02/11/2016 19:03    Procedures Procedures  (including critical care time)  Medications Ordered in ED Medications  sodium chloride 0.9 % bolus 1,000 mL (0 mLs Intravenous Stopped 02/11/16 1926)  morphine 4 MG/ML injection 4 mg (4 mg Intravenous Given 02/11/16 1732)  ondansetron (ZOFRAN) injection 4 mg (4 mg Intravenous Given 02/11/16 1732)  iopamidol (ISOVUE-300) 61 % injection (100 mLs  Contrast Given 02/11/16 1821)  morphine 4 MG/ML injection 4 mg (4 mg Intravenous Given 02/11/16 1925)  metroNIDAZOLE (FLAGYL) tablet 500 mg (500 mg Oral Given 02/11/16 1925)  ciprofloxacin (CIPRO) tablet 750 mg (750 mg Oral Given 02/11/16 1925)     Initial Impression / Assessment and Plan / ED Course  I have reviewed the triage vital signs and the nursing notes.  Pertinent labs & imaging results that were available during my care of the patient were reviewed by me and considered in my medical decision making (see chart for details).  Clinical Course    Patient presents with sudden onset of lower abdominal pain progressively getting worse. He is overall well-appearing and afebrile, mild tachycardia to the low 100s but with stable blood pressures. Labs were unremarkable. CT with contrast obtained to evaluate for pathology given lower abdominal tenderness. This revealed acute uncomplicated diverticulitis without abscess or perforation. Symptoms improved with pain control and anti-emetics. He is not septic and is tolerating PO. He is appropriate for outpatient management. Will go home on a course of ciprofloxacin and flagyl, as well as a small prescription for Norco and zofran for symptom relief. Will follow up as outpatient and was given return precautions for worsening symptoms. Expressed understanding and he is in good condition for discharge home.  Final Clinical Impressions(s) / ED Diagnoses   Final diagnoses:  Diverticulitis of intestine without perforation or abscess without bleeding, unspecified part of intestinal tract  Generalized abdominal  pain    New Prescriptions Discharge Medication List as of 02/11/2016  7:15 PM    START taking these medications   Details  HYDROcodone-acetaminophen (NORCO/VICODIN) 5-325 MG tablet Take 1-2 tablets by mouth every 6 (six) hours as needed for severe pain., Starting Tue 02/11/2016, Print    ondansetron (ZOFRAN) 4 MG tablet Take 1 tablet (4 mg total) by mouth every 8 (eight) hours as needed for nausea or vomiting., Starting Tue 02/11/2016, Print         Preston FleetingAnna Hailyn Zarr, MD 02/12/16 [id number]    Gwyneth SproutWhitney Plunkett, MD 02/12/16 2310

## 2016-04-17 ENCOUNTER — Encounter (HOSPITAL_COMMUNITY): Payer: Self-pay

## 2016-04-17 DIAGNOSIS — R1032 Left lower quadrant pain: Secondary | ICD-10-CM | POA: Insufficient documentation

## 2016-04-17 DIAGNOSIS — I10 Essential (primary) hypertension: Secondary | ICD-10-CM | POA: Insufficient documentation

## 2016-04-17 LAB — CBC
HCT: 47.1 % (ref 39.0–52.0)
Hemoglobin: 16.4 g/dL (ref 13.0–17.0)
MCH: 31.2 pg (ref 26.0–34.0)
MCHC: 34.8 g/dL (ref 30.0–36.0)
MCV: 89.7 fL (ref 78.0–100.0)
Platelets: 271 10*3/uL (ref 150–400)
RBC: 5.25 MIL/uL (ref 4.22–5.81)
RDW: 13.4 % (ref 11.5–15.5)
WBC: 5.7 10*3/uL (ref 4.0–10.5)

## 2016-04-17 LAB — COMPREHENSIVE METABOLIC PANEL
ALT: 40 U/L (ref 17–63)
AST: 57 U/L — ABNORMAL HIGH (ref 15–41)
Albumin: 3.6 g/dL (ref 3.5–5.0)
Alkaline Phosphatase: 90 U/L (ref 38–126)
Anion gap: 12 (ref 5–15)
BUN: 10 mg/dL (ref 6–20)
CO2: 20 mmol/L — ABNORMAL LOW (ref 22–32)
Calcium: 8.7 mg/dL — ABNORMAL LOW (ref 8.9–10.3)
Chloride: 107 mmol/L (ref 101–111)
Creatinine, Ser: 0.89 mg/dL (ref 0.61–1.24)
GFR calc Af Amer: >60 mL/min (ref >60)
GFR calc non Af Amer: >60 mL/min (ref >60)
Glucose, Bld: 102 mg/dL — ABNORMAL HIGH (ref 65–99)
Potassium: 3.6 mmol/L (ref 3.5–5.1)
Sodium: 139 mmol/L (ref 135–145)
Total Bilirubin: 0.6 mg/dL (ref 0.3–1.2)
Total Protein: 6.9 g/dL (ref 6.5–8.1)

## 2016-04-17 NOTE — ED Triage Notes (Signed)
Pt states for the last three days he has been having rectal bleeding, has had diverticulitis in the past. States that blood is bright red.

## 2016-04-17 NOTE — ED Triage Notes (Signed)
Pt is a Jehovah witness and will not accept a blood transfusion if necessary.

## 2016-04-18 ENCOUNTER — Emergency Department (HOSPITAL_COMMUNITY)
Admission: EM | Admit: 2016-04-18 | Discharge: 2016-04-18 | Disposition: A | Discharge status: Home / Self Care | Payer: PRIVATE HEALTH INSURANCE | Attending: Emergency Medicine | Admitting: Emergency Medicine

## 2016-04-18 ENCOUNTER — Emergency Department (HOSPITAL_COMMUNITY): Payer: Self-pay

## 2016-04-18 DIAGNOSIS — R109 Unspecified abdominal pain: Secondary | ICD-10-CM

## 2016-04-18 LAB — POC OCCULT BLOOD, ED: Fecal Occult Bld: NEGATIVE

## 2016-04-18 MED ORDER — ONDANSETRON HCL 4 MG/2ML IJ SOLN
4.0000 mg | Freq: Once | INTRAMUSCULAR | Status: AC
  Administered 2016-04-18: 4 mg via INTRAVENOUS
  Filled 2016-04-18: qty 2

## 2016-04-18 MED ORDER — IOPAMIDOL (ISOVUE-300) INJECTION 61%
INTRAVENOUS | Status: AC
  Administered 2016-04-18: 100 mL
  Filled 2016-04-18: qty 100

## 2016-04-18 MED ORDER — MORPHINE SULFATE (PF) 4 MG/ML IV SOLN
4.0000 mg | Freq: Once | INTRAVENOUS | Status: AC
  Administered 2016-04-18: 4 mg via INTRAVENOUS
  Filled 2016-04-18: qty 1

## 2016-04-18 NOTE — Discharge Instructions (Signed)
Return to the ED with any concerns including vomiting and not able to keep down liquids or your medications, abdominal pain especially if it localizes to the right lower abdomen, fever or chills, and decreased urine output, decreased level of alertness or lethargy, or any other alarming symptoms.   The CT scan performed today did not show any acute abnormalities- no diverticulitis.

## 2016-04-18 NOTE — ED Provider Notes (Signed)
[facility]-EMERGENCY DEPT Provider Note   CSN: [id number] Arrival date & time: 04/17/16  2025     History   Chief Complaint Chief Complaint  Patient presents with  . Rectal Bleeding    HPI Victor Long is a 56 y.o. male.  HPI  Pt presenting with c/o rectal bleeding.  He states that he has pain in left lower abdomen which is similar to prior diverticulitis- he was treated approx 2 months ago - states he finished abx course but still has some lingering pain in left lower abdomen since that time- has become worse over the past several day.  Has also been seeing bright red blood in stools and having some looser stools that normal.  No fever/chills.  No vomiting.  There are no other associated systemic symptoms, there are no other alleviating or modifying factors.   Past Medical History:  Diagnosis Date  . Back pain, chronic   . Bipolar disorder (HCC)   . Depression   . Diverticulitis   . Hypertension   . Sciatica     Patient Active Problem List   Diagnosis Date Noted  . Bipolar disorder (HCC) 01/09/2016  . Insomnia 01/09/2016  . Diverticulitis 01/04/2016  . BRBPR (bright red blood per rectum) 01/04/2016  . ETOH abuse 01/04/2016    Past Surgical History:  Procedure Laterality Date  . BACK SURGERY    . SHOULDER SURGERY         Home Medications    Prior to Admission medications   Medication Sig Start Date End Date Taking? Authorizing Provider  ciprofloxacin (CIPRO) 500 MG tablet Take 1 tablet (500 mg total) by mouth 2 (two) times daily. 02/11/16   Preston Fleeting, MD  divalproex (DEPAKOTE) 500 MG DR tablet Take 500 mg by mouth 3 (three) times daily.    Historical Provider, MD  HYDROcodone-acetaminophen (NORCO/VICODIN) 5-325 MG tablet Take 1-2 tablets by mouth every 6 (six) hours as needed for severe pain. 02/11/16   Preston Fleeting, MD  methocarbamol (ROBAXIN) 500 MG tablet Take 1 tablet (500 mg total) by mouth every 8 (eight) hours as needed for muscle spasms. 01/14/16    Jaclyn Shaggy, MD  metroNIDAZOLE (FLAGYL) 500 MG tablet Take 1 tablet (500 mg total) by mouth 2 (two) times daily. 02/11/16   Preston Fleeting, MD  Misc Natural Products (GINSENG-KOREAN PO) Take by mouth See admin instructions. Drink 4.5 ounces by mouth four to five times a day    Historical Provider, MD  naproxen (NAPROSYN) 500 MG tablet Take 500 mg by mouth daily as needed (for back pain).    Historical Provider, MD  ondansetron (ZOFRAN) 4 MG tablet Take 1 tablet (4 mg total) by mouth every 8 (eight) hours as needed for nausea or vomiting. 02/11/16   Preston Fleeting, MD  traZODone (DESYREL) 50 MG tablet Take 1 tablet (50 mg total) by mouth at bedtime as needed for sleep. 01/09/16   Jaclyn Shaggy, MD  Witch Hazel (PREPARATION H EX) Apply 1 application topically 6 (six) times daily.    Historical Provider, MD    Family History Family History  Problem Relation Age of Onset  . Cancer Father     Social History Social History  Substance Use Topics  . Smoking status: Never Smoker  . Smokeless tobacco: Current User    Types: Chew  . Alcohol use 3.6 - 7.2 oz/week    6 - 12 Cans of beer per week     Allergies   Patient has no known allergies.  Review of Systems Review of Systems  ROS reviewed and all otherwise negative except for mentioned in HPI   Physical Exam Updated Vital Signs BP 104/74   Pulse 86   Temp 98 F (36.7 C) (Oral)   Resp 18   Ht 5' 5.25" (1.657 m)   Wt 171 lb 7 oz (77.8 kg)   SpO2 97%   BMI 28.31 kg/m  Vitals reviewed Physical Exam Physical Examination: General appearance - alert, well appearing, and in no distress Mental status - alert, oriented to person, place, and time Eyes - no conjunctival injection, no scleral icterus Mouth - mucous membranes moist, pharynx normal without lesions Neck - supple, no significant adenopathy Chest - clear to auscultation, no wheezes, rales or rhonchi, symmetric air entry Heart - normal rate, regular rhythm, normal S1, S2, no  murmurs, rubs, clicks or gallops Abdomen - soft, mild tenderness in left lower abdomen, no gaurding or rebound, nabs, nondistended, no masses or organomegaly Neurological - alert, oriented, normal speech Extremities - peripheral pulses normal, no pedal edema, no clubbing or cyanosis Skin - normal coloration and turgor, no rashes  ED Treatments / Results  Labs (all labs ordered are listed, but only abnormal results are displayed) Labs Reviewed  COMPREHENSIVE METABOLIC PANEL - Abnormal; Notable for the following:       Result Value   CO2 20 (*)    Glucose, Bld 102 (*)    Calcium 8.7 (*)    AST 57 (*)    All other components within normal limits  CBC  POC OCCULT BLOOD, ED    EKG  EKG Interpretation None       Radiology Ct Abdomen Pelvis W Contrast  Result Date: 04/18/2016 CLINICAL DATA:  56 y/o M; left lower abdominal pain worsening in the last 2 weeks. History of diverticulitis. EXAM: CT ABDOMEN AND PELVIS WITH CONTRAST TECHNIQUE: Multidetector CT imaging of the abdomen and pelvis was performed using the standard protocol following bolus administration of intravenous contrast. CONTRAST:  100mL ISOVUE-300 IOPAMIDOL (ISOVUE-300) INJECTION 61% COMPARISON:  02/11/2016 CT of the abdomen and pelvis. FINDINGS: Lower chest: No acute abnormality. Hepatobiliary: Stable liver segment 4A cyst. No other liver abnormality is identified. Normal gallbladder. No intra or extrahepatic biliary ductal dilatation. Pancreas: Unremarkable. No pancreatic ductal dilatation or surrounding inflammatory changes. Spleen: Normal in size without focal abnormality. Adrenals/Urinary Tract: Adrenal glands are unremarkable. Kidneys are normal, without renal calculi, focal lesion, or hydronephrosis. Bladder is unremarkable. Stomach/Bowel: Moderate sigmoid diverticulosis. No evidence for diverticulitis. No significant obstructive or inflammatory changes of the bowel. Normal appendix. Vascular/Lymphatic: Aortic  atherosclerosis with mild calcification. No enlarged abdominal or pelvic lymph nodes. Reproductive: Mild prostate enlargement. Other: Small paraumbilical hernia containing fat.  No ascites. Musculoskeletal: Moderate multilevel degenerative changes of the visible thoracic and lumbar spine and prominent lower lumbar facet arthropathy. No acute osseous abnormality identified. IMPRESSION: 1. No acute process identified as explanation for abdominal pain. 2. Moderate sigmoid diverticulosis without evidence of diverticulitis. 3. Mild aortic atherosclerosis. Electronically Signed   By: Mitzi HansenLance  Furusawa-Stratton M.D.   On: 04/18/2016 05:31    Procedures Procedures (including critical care time)  Medications Ordered in ED Medications  morphine 4 MG/ML injection 4 mg (4 mg Intravenous Given 04/18/16 0425)  ondansetron (ZOFRAN) injection 4 mg (4 mg Intravenous Given 04/18/16 0425)  iopamidol (ISOVUE-300) 61 % injection (100 mLs  Contrast Given 04/18/16 0454)     Initial Impression / Assessment and Plan / ED Course  I have reviewed the triage vital  signs and the nursing notes.  Pertinent labs & imaging results that were available during my care of the patient were reviewed by me and considered in my medical decision making (see chart for details).  Clinical Course     Pt presenting with c/o recttal bleeding and mild left lower abdominal pain- labs are reassuring wtihtout hemoglobin, CT scan does now show any deverticulitis.  Discharged with strict return precautions.  Pt agreeable with plan.  Final Clinical Impressions(s) / ED Diagnoses   Final diagnoses:  Abdominal pain, unspecified abdominal location    New Prescriptions Discharge Medication List as of 04/18/2016  6:32 AM       Jerelyn Scott, MD 04/22/16 1626

## 2016-04-23 ENCOUNTER — Encounter (HOSPITAL_COMMUNITY): Payer: Self-pay | Admitting: Emergency Medicine

## 2016-04-23 ENCOUNTER — Emergency Department (HOSPITAL_COMMUNITY)
Admission: EM | Admit: 2016-04-23 | Discharge: 2016-04-24 | Disposition: A | Discharge status: Home / Self Care | Payer: PRIVATE HEALTH INSURANCE | Attending: Emergency Medicine | Admitting: Emergency Medicine

## 2016-04-23 DIAGNOSIS — I10 Essential (primary) hypertension: Secondary | ICD-10-CM | POA: Insufficient documentation

## 2016-04-23 DIAGNOSIS — K529 Noninfective gastroenteritis and colitis, unspecified: Secondary | ICD-10-CM

## 2016-04-23 DIAGNOSIS — R10814 Left lower quadrant abdominal tenderness: Secondary | ICD-10-CM | POA: Insufficient documentation

## 2016-04-23 DIAGNOSIS — R197 Diarrhea, unspecified: Secondary | ICD-10-CM | POA: Insufficient documentation

## 2016-04-23 DIAGNOSIS — Z79899 Other long term (current) drug therapy: Secondary | ICD-10-CM | POA: Insufficient documentation

## 2016-04-23 LAB — COMPREHENSIVE METABOLIC PANEL
ALT: 61 U/L (ref 17–63)
AST: 95 U/L — ABNORMAL HIGH (ref 15–41)
Albumin: 3.7 g/dL (ref 3.5–5.0)
Alkaline Phosphatase: 91 U/L (ref 38–126)
Anion gap: 11 (ref 5–15)
BUN: <5 mg/dL — ABNORMAL LOW (ref 6–20)
CO2: 22 mmol/L (ref 22–32)
Calcium: 8.6 mg/dL — ABNORMAL LOW (ref 8.9–10.3)
Chloride: 107 mmol/L (ref 101–111)
Creatinine, Ser: 0.86 mg/dL (ref 0.61–1.24)
GFR calc Af Amer: >60 mL/min (ref >60)
GFR calc non Af Amer: >60 mL/min (ref >60)
Glucose, Bld: 103 mg/dL — ABNORMAL HIGH (ref 65–99)
Potassium: 3.6 mmol/L (ref 3.5–5.1)
Sodium: 140 mmol/L (ref 135–145)
Total Bilirubin: 0.5 mg/dL (ref 0.3–1.2)
Total Protein: 6.7 g/dL (ref 6.5–8.1)

## 2016-04-23 LAB — CBC
HCT: 47.7 % (ref 39.0–52.0)
Hemoglobin: 16.3 g/dL (ref 13.0–17.0)
MCH: 30.6 pg (ref 26.0–34.0)
MCHC: 34.2 g/dL (ref 30.0–36.0)
MCV: 89.7 fL (ref 78.0–100.0)
Platelets: 300 10*3/uL (ref 150–400)
RBC: 5.32 MIL/uL (ref 4.22–5.81)
RDW: 13.5 % (ref 11.5–15.5)
WBC: 5.8 10*3/uL (ref 4.0–10.5)

## 2016-04-23 LAB — URINALYSIS, ROUTINE W REFLEX MICROSCOPIC
Bilirubin Urine: NEGATIVE
Glucose, UA: NEGATIVE mg/dL
Hgb urine dipstick: NEGATIVE
Ketones, ur: NEGATIVE mg/dL
Leukocytes, UA: NEGATIVE
Nitrite: NEGATIVE
Protein, ur: NEGATIVE mg/dL
Specific Gravity, Urine: 1.003 — ABNORMAL LOW (ref 1.005–1.030)
pH: 5 (ref 5.0–8.0)

## 2016-04-23 LAB — LIPASE, BLOOD: Lipase: 32 U/L (ref 11–51)

## 2016-04-23 NOTE — ED Provider Notes (Signed)
[facility]-EMERGENCY DEPT Provider Note   CSN: [id number] Arrival date & time: 04/23/16  1746     History   Chief Complaint Chief Complaint  Patient presents with  . Diarrhea  . Rectal Bleeding    HPI Victor Long is a 56 y.o. male.  HPI   56 year old male with history of diverticulosis, bipolar, depression, chronic back pain, alcohol abuse presenting complaining of rectal bleeding and persistent diarrhea. Patient report recurrent loose stools as well as seeing bright red blood in stool which has been ongoing for the past 3 weeks. Was diagnosed with prior diverticulitis approximately 2 months ago and was treated with antibiotic the pain never fully gone away. No associated fever, chills, vomiting. Patient was seen in the ED yesterday for the same complaint. Labs at that time was reassuring, abdominal and pelvis CT scan without any acute finding. Patient however states that he has about 7 bouts of bowel movement daily. No associated nausea or vomiting, no fever or chills. Occasional lightheadedness. Does complain of pain to his left lower quadrant. Last antibody use was Cipro and Flagyl and was treated for diverticulitis 2 months prior. No history of C. difficile. Has had colonoscopy in the past with removal of benign polyps. Does not have a GI specialist currently. Admits to drinking alcohol daily usually 12 pack a day, last consumption was today. Denies tobacco use.  Past Medical History:  Diagnosis Date  . Back pain, chronic   . Bipolar disorder (HCC)   . Depression   . Diverticulitis   . Hypertension   . Sciatica     Patient Active Problem List   Diagnosis Date Noted  . Bipolar disorder (HCC) 01/09/2016  . Insomnia 01/09/2016  . Diverticulitis 01/04/2016  . BRBPR (bright red blood per rectum) 01/04/2016  . ETOH abuse 01/04/2016    Past Surgical History:  Procedure Laterality Date  . BACK SURGERY    . SHOULDER SURGERY         Home Medications    Prior to  Admission medications   Medication Sig Start Date End Date Taking? Authorizing Provider  ciprofloxacin (CIPRO) 500 MG tablet Take 1 tablet (500 mg total) by mouth 2 (two) times daily. 02/11/16   Preston FleetingAnna Schubert, MD  divalproex (DEPAKOTE) 500 MG DR tablet Take 500 mg by mouth 3 (three) times daily.    Historical Provider, MD  HYDROcodone-acetaminophen (NORCO/VICODIN) 5-325 MG tablet Take 1-2 tablets by mouth every 6 (six) hours as needed for severe pain. 02/11/16   Preston FleetingAnna Schubert, MD  methocarbamol (ROBAXIN) 500 MG tablet Take 1 tablet (500 mg total) by mouth every 8 (eight) hours as needed for muscle spasms. 01/14/16   Jaclyn ShaggyEnobong Amao, MD  metroNIDAZOLE (FLAGYL) 500 MG tablet Take 1 tablet (500 mg total) by mouth 2 (two) times daily. 02/11/16   Preston FleetingAnna Schubert, MD  Misc Natural Products (GINSENG-KOREAN PO) Take by mouth See admin instructions. Drink 4.5 ounces by mouth four to five times a day    Historical Provider, MD  naproxen (NAPROSYN) 500 MG tablet Take 500 mg by mouth daily as needed (for back pain).    Historical Provider, MD  ondansetron (ZOFRAN) 4 MG tablet Take 1 tablet (4 mg total) by mouth every 8 (eight) hours as needed for nausea or vomiting. 02/11/16   Preston FleetingAnna Schubert, MD  traZODone (DESYREL) 50 MG tablet Take 1 tablet (50 mg total) by mouth at bedtime as needed for sleep. 01/09/16   Jaclyn ShaggyEnobong Amao, MD  Witch Hazel (PREPARATION H EX) Apply 1  application topically 6 (six) times daily.    Historical Provider, MD    Family History Family History  Problem Relation Age of Onset  . Cancer Father     Social History Social History  Substance Use Topics  . Smoking status: Never Smoker  . Smokeless tobacco: Current User    Types: Chew  . Alcohol use 3.6 - 7.2 oz/week    6 - 12 Cans of beer per week     Allergies   Patient has no known allergies.   Review of Systems Review of Systems  All other systems reviewed and are negative.    Physical Exam Updated Vital Signs BP 135/96 (BP  Location: Right Arm)   Pulse 103   Temp 97.7 F (36.5 C)   Resp 18   Ht 5\' 5"  (1.651 m)   Wt 77.2 kg   SpO2 96%   BMI 28.33 kg/m   Physical Exam  Constitutional: He appears well-developed and well-nourished. No distress.  HENT:  Head: Atraumatic.  Eyes: Conjunctivae are normal.  Neck: Neck supple.  Cardiovascular: Normal rate and regular rhythm.   Pulmonary/Chest: Effort normal and breath sounds normal.  Abdominal: Soft. He exhibits no distension. There is tenderness (Tenderness to left lower quadrant on palpation without guarding or rebound tenderness.).  Neurological: He is alert.  Skin: No rash noted.  Psychiatric: He has a normal mood and affect.  Nursing note and vitals reviewed.    ED Treatments / Results  Labs (all labs ordered are listed, but only abnormal results are displayed) Labs Reviewed  COMPREHENSIVE METABOLIC PANEL - Abnormal; Notable for the following:       Result Value   Glucose, Bld 103 (*)    BUN <5 (*)    Calcium 8.6 (*)    AST 95 (*)    All other components within normal limits  URINALYSIS, ROUTINE W REFLEX MICROSCOPIC - Abnormal; Notable for the following:    Color, Urine COLORLESS (*)    Specific Gravity, Urine 1.003 (*)    All other components within normal limits  LIPASE, BLOOD  CBC    EKG  EKG Interpretation None       Radiology No results found.  Procedures Procedures (including critical care time)  Medications Ordered in ED Medications - No data to display   Initial Impression / Assessment and Plan / ED Course  I have reviewed the triage vital signs and the nursing notes.  Pertinent labs & imaging results that were available during my care of the patient were reviewed by me and considered in my medical decision making (see chart for details).  Clinical Course     BP 113/83   Pulse 92   Temp 97.7 F (36.5 C)   Resp 18   Ht 5\' 5"  (1.651 m)   Wt 77.2 kg   SpO2 95%   BMI 28.33 kg/m    Final Clinical  Impressions(s) / ED Diagnoses   Final diagnoses:  Chronic diarrhea    New Prescriptions New Prescriptions   LOPERAMIDE (IMODIUM) 2 MG CAPSULE    Take 1 capsule (2 mg total) by mouth 4 (four) times daily as needed for diarrhea or loose stools.   12:41 AM Patient report persistent diarrhea for several months. History of diverticulitis. Was seen in the ED yesterday for the same complaint and had a CT scan of the abdomen and pelvis without any acute finding. His labs were reassuring yesterday. Labs are reassuring again today. At this time, I encouraged  patient to follow-up with GI specialist for further evaluation of his chronic diarrhea. I have low suspicion for C. difficile. Patient did not produce any bowel movement while in the ED. I encouraged patient to return if his condition worsened. Outpatient referral to Dr. Jeani Hawking.  Patient discharged with Imodium   Fayrene Helper, PA-C 04/24/16 9147    Tomasita Crumble, MD 04/24/16 [phone]

## 2016-04-23 NOTE — ED Notes (Signed)
Patient walked independently to the room.  No signs of distress noted

## 2016-04-23 NOTE — ED Triage Notes (Signed)
Pt. Stated, I've had diarrhea for 3 weeks ago and was here a week ago and now Im having diarrhea still with rectal bleeding. My lower abdomen is hurting also.

## 2016-04-24 MED ORDER — LOPERAMIDE HCL 2 MG PO CAPS: 2.0000 mg | ORAL_CAPSULE | Freq: Four times a day (QID) | ORAL | 0 refills | Status: DC | PRN

## 2016-04-24 NOTE — ED Notes (Signed)
Patient Alert and oriented X4. Stable and ambulatory. Patient verbalized understanding of the discharge instructions.  Patient belongings were taken by the patient.  

## 2016-05-01 MED FILL — traZODone HCL 100 MG TABS: 100 | 30 days supply | Qty: 15 | Fill #1

## 2016-05-24 ENCOUNTER — Emergency Department (HOSPITAL_COMMUNITY): Payer: Self-pay

## 2016-05-24 ENCOUNTER — Emergency Department (HOSPITAL_COMMUNITY)
Admission: EM | Admit: 2016-05-24 | Discharge: 2016-05-24 | Disposition: A | Discharge status: Home / Self Care | Payer: Self-pay | Attending: Emergency Medicine | Admitting: Emergency Medicine

## 2016-05-24 ENCOUNTER — Encounter (HOSPITAL_COMMUNITY): Payer: Self-pay | Admitting: Emergency Medicine

## 2016-05-24 DIAGNOSIS — R0602 Shortness of breath: Secondary | ICD-10-CM | POA: Insufficient documentation

## 2016-05-24 DIAGNOSIS — L0211 Cutaneous abscess of neck: Secondary | ICD-10-CM | POA: Insufficient documentation

## 2016-05-24 DIAGNOSIS — I10 Essential (primary) hypertension: Secondary | ICD-10-CM | POA: Insufficient documentation

## 2016-05-24 DIAGNOSIS — R21 Rash and other nonspecific skin eruption: Secondary | ICD-10-CM

## 2016-05-24 DIAGNOSIS — Z79899 Other long term (current) drug therapy: Secondary | ICD-10-CM | POA: Insufficient documentation

## 2016-05-24 DIAGNOSIS — L0291 Cutaneous abscess, unspecified: Secondary | ICD-10-CM

## 2016-05-24 LAB — CBC WITH DIFFERENTIAL/PLATELET
Basophils Absolute: 0 10*3/uL (ref 0.0–0.1)
Basophils Relative: 1 %
Eosinophils Absolute: 0.1 10*3/uL (ref 0.0–0.7)
Eosinophils Relative: 2 %
HCT: 47.8 % (ref 39.0–52.0)
Hemoglobin: 16.1 g/dL (ref 13.0–17.0)
Lymphocytes Relative: 12 %
Lymphs Abs: 1 10*3/uL (ref 0.7–4.0)
MCH: 30.6 pg (ref 26.0–34.0)
MCHC: 33.7 g/dL (ref 30.0–36.0)
MCV: 90.9 fL (ref 78.0–100.0)
Monocytes Absolute: 0.9 10*3/uL (ref 0.1–1.0)
Monocytes Relative: 10 %
Neutro Abs: 6.7 10*3/uL (ref 1.7–7.7)
Neutrophils Relative %: 77 %
Platelets: 238 10*3/uL (ref 150–400)
RBC: 5.26 MIL/uL (ref 4.22–5.81)
RDW: 13.4 % (ref 11.5–15.5)
WBC: 8.8 10*3/uL (ref 4.0–10.5)

## 2016-05-24 LAB — COMPREHENSIVE METABOLIC PANEL
ALT: 45 U/L (ref 17–63)
AST: 52 U/L — ABNORMAL HIGH (ref 15–41)
Albumin: 3.4 g/dL — ABNORMAL LOW (ref 3.5–5.0)
Alkaline Phosphatase: 87 U/L (ref 38–126)
Anion gap: 12 (ref 5–15)
BUN: 7 mg/dL (ref 6–20)
CO2: 24 mmol/L (ref 22–32)
Calcium: 9 mg/dL (ref 8.9–10.3)
Chloride: 101 mmol/L (ref 101–111)
Creatinine, Ser: 0.94 mg/dL (ref 0.61–1.24)
GFR calc Af Amer: >60 mL/min (ref >60)
GFR calc non Af Amer: >60 mL/min (ref >60)
Glucose, Bld: 146 mg/dL — ABNORMAL HIGH (ref 65–99)
Potassium: 3.6 mmol/L (ref 3.5–5.1)
Sodium: 137 mmol/L (ref 135–145)
Total Bilirubin: 0.8 mg/dL (ref 0.3–1.2)
Total Protein: 6.5 g/dL (ref 6.5–8.1)

## 2016-05-24 MED ORDER — PERMETHRIN 5 % EX CREA: TOPICAL_CREAM | CUTANEOUS | 0 refills | Status: DC

## 2016-05-24 MED ORDER — PREDNISONE 20 MG PO TABS: 40.0000 mg | ORAL_TABLET | Freq: Every day | ORAL | 0 refills | Status: DC

## 2016-05-24 MED ORDER — CLINDAMYCIN HCL 300 MG PO CAPS: 300.0000 mg | ORAL_CAPSULE | Freq: Four times a day (QID) | ORAL | 0 refills | Status: DC

## 2016-05-24 MED ORDER — LIDOCAINE HCL (PF) 1 % IJ SOLN
5.0000 mL | Freq: Once | INTRAMUSCULAR | Status: AC
Start: 2016-05-24 — End: 2016-05-24
  Administered 2016-05-24: 5 mL
  Filled 2016-05-24: qty 5

## 2016-05-24 MED ORDER — OXYCODONE-ACETAMINOPHEN 5-325 MG PO TABS
1.0000 | ORAL_TABLET | Freq: Once | ORAL | Status: AC
  Administered 2016-05-24: 1 via ORAL
  Filled 2016-05-24: qty 1

## 2016-05-24 MED ORDER — NAPROXEN 250 MG PO TABS
375.0000 mg | ORAL_TABLET | Freq: Once | ORAL | Status: AC
  Administered 2016-05-24: 375 mg via ORAL
  Filled 2016-05-24: qty 2

## 2016-05-24 NOTE — Discharge Instructions (Signed)
Take naproxen 250mg  every 12h for up to 10 days for pain Tylenol 500mg  every 6h as needed for pain Benadryl for itchiness  If symptoms not improving, you may try prednisone

## 2016-05-24 NOTE — ED Provider Notes (Signed)
[facility] Provider Note   CSN: [id number] Arrival date & time: 05/24/16  1240     History   Chief Complaint Chief Complaint  Patient presents with  . Shortness of Breath  . Allergic Reaction  . Rash    HPI Victor Long is a 56 y.o. male.  HPI Pt reports dyed his beard 3 weeks ago and has had a red rash to face and gerneralized red bumps.   Patient states that after he died his beard, he developed a chemical burn to his beard. He did not think much about it until a week later when he started noticing that it was spreading to his head and neck. He subsequently had this checked a week ago and was told he had seborrheic dermatitis and was prescribed with a prednisone cream. He states however the Coumadin has not helped. Since then, the rash had spread to his trunk, abdomen, chest, extremities, interdigital areas. He states it itches but does not hurt. No fevers. No vomiting, nausea.   However, he had felt the redness to his anterior neck and below his chin or his beard was. He states is tender, swollen. He feels like it's pushing on his neck. He however has no difficulty speaking, his voice is unchanged. He states he feels like it's pushing on his neck but does not endorse any difficulty breathing otherwise. No chest pain or shortness of breath. No syncope. No immunocompromise known.   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 He has never been checked for HIV in the last year but in the past has been told he was negative. Denies any IV drug use but does endorse alcohol abuse daily.   Past Medical History:  Diagnosis Date  . Back pain, chronic   . Bipolar disorder (HCC)   . Depression   . Diverticulitis   . Hypertension   . Sciatica     Patient Active Problem List   Diagnosis Date Noted  . Bipolar disorder (HCC) 01/09/2016  . Insomnia 01/09/2016  . Diverticulitis 01/04/2016  . BRBPR (bright red blood per rectum) 01/04/2016  . ETOH  abuse 01/04/2016    Past Surgical History:  Procedure Laterality Date  . BACK SURGERY    . SHOULDER SURGERY         Home Medications    Prior to Admission medications   Medication Sig Start Date End Date Taking? Authorizing Provider  divalproex (DEPAKOTE) 500 MG DR tablet Take 500 mg by mouth 3 (three) times daily.   Yes Historical Provider, MD  hydrocortisone cream 1 % Apply 1 application topically 2 (two) times daily.   Yes Historical Provider, MD  ketoconazole (NIZORAL) 2 % cream Apply 1 application topically 2 (two) times daily.   Yes Historical Provider, MD  loperamide (IMODIUM) 2 MG capsule Take 1 capsule (2 mg total) by mouth 4 (four) times daily as needed for diarrhea or loose stools. 04/24/16  Yes Fayrene HelperBowie Tran, PA-C  naproxen (NAPROSYN) 500 MG tablet Take 500 mg by mouth daily as needed (for back pain).   Yes Historical Provider, MD  traZODone (DESYREL) 100 MG tablet Take 100 mg by mouth at bedtime as needed for sleep.  05/01/16  Yes Historical Provider, MD  clindamycin (CLEOCIN) 300 MG capsule Take 1 capsule (300 mg total) by mouth every 6 (six) hours. 05/24/16   Sidney AceAlison Charruf Arthor Gorter, MD  permethrin (ELIMITE) 5 % cream Apply cream from head to feet, leave on for 8 to 14 hours,  then wash with soap/water, repeat application if living mites present 14 days after initial treatment (FDA dosage 05/24/16   Sidney Ace, MD  predniSONE (DELTASONE) 20 MG tablet Take 2 tablets (40 mg total) by mouth daily. 05/24/16   Sidney Ace, MD    Family History Family History  Problem Relation Age of Onset  . Cancer Father     Social History Social History  Substance Use Topics  . Smoking status: Never Smoker  . Smokeless tobacco: Current User    Types: Chew  . Alcohol use 3.6 - 7.2 oz/week    6 - 12 Cans of beer per week     Allergies   Other   Review of Systems Review of Systems  Constitutional: Negative for fever.  Allergic/Immunologic: Negative for immunocompromised  state.  All other systems reviewed and are negative.    Physical Exam Updated Vital Signs BP 126/96   Pulse 106   Temp 98.2 F (36.8 C) (Oral)   Resp 20   Ht 5\' 5"  (1.651 m)   Wt 73.5 kg   SpO2 97%   BMI 26.96 kg/m   Physical Exam  Constitutional: He appears well-developed and well-nourished. No distress.  HENT:  Head: Normocephalic and atraumatic.  Left Ear: External ear normal.  Eyes: Conjunctivae are normal. Pupils are equal, round, and reactive to light. Right eye exhibits no discharge. Left eye exhibits no discharge.  Neck: Normal range of motion. Neck supple.  Cardiovascular: Normal rate and regular rhythm.   No murmur heard. Pulmonary/Chest: Effort normal and breath sounds normal. No respiratory distress. He has no wheezes. He has no rales. He exhibits no tenderness.  Abdominal: Soft. Bowel sounds are normal. He exhibits no distension and no mass. There is no tenderness. There is no rebound and no guarding.  Musculoskeletal: He exhibits no edema.  Neurological: He is alert.  Skin: Skin is warm. Rash noted. He is not diaphoretic.  Psychiatric: He has a normal mood and affect.   Patient has a large fluctuant mass in the anterior neck and below the chin that's approximately 5 cm with surrounding erythema. It is tender to palpation and warm. He is handling his secretions well without any stridor, drooling. There is no angioedema. No wheezing, vomiting noted during my examination. He does have a rash along his head, neck, abdomen, trunk, arms extending into the intertriginous areas that appears dry. He does have a seborrheic component around his eyebrows. The rash is erythematous with some linear portion. It is not raised. They look like blisters and pimples.  ED Treatments / Results  Labs (all labs ordered are listed, but only abnormal results are displayed) Labs Reviewed  COMPREHENSIVE METABOLIC PANEL - Abnormal; Notable for the following:       Result Value   Glucose,  Bld 146 (*)    Albumin 3.4 (*)    AST 52 (*)    All other components within normal limits  CBC WITH DIFFERENTIAL/PLATELET  HIV ANTIBODY (ROUTINE TESTING)    EKG  EKG Interpretation  Date/Time:  Sunday May 24 2016 16:57:57 EST Ventricular Rate:  104 PR Interval:    QRS Duration: 93 QT Interval:  340 QTC Calculation: 448 R Axis:   70 Text Interpretation:  Sinus tachycardia Baseline wander in lead(s) V3 SINCE LAST TRACING HEART RATE HAS INCREASED Confirmed by BELFI  MD, MELANIE ([id number]) on 05/24/2016 6:53:54 PM       Radiology Dg Chest Port 1 View  Result Date: 05/24/2016 CLINICAL DATA:  Shortness of breath, neck redness and swelling EXAM: PORTABLE CHEST 1 VIEW COMPARISON:  None. FINDINGS: Mild left basilar opacity, likely atelectasis. No pleural effusion or pneumothorax. The heart is normal in size. IMPRESSION: Mild left basilar opacity, likely atelectasis. Electronically Signed   By: Charline Bills M.D.   On: 05/24/2016 16:53    Procedures .Marland KitchenIncision and Drainage Date/Time: 05/24/2016 6:33 PM Performed by: Sidney Ace Authorized by: Sidney Ace   Consent:    Consent obtained:  Verbal   Consent given by:  Patient   Risks discussed:  Bleeding, damage to other organs, infection, incomplete drainage and pain   Alternatives discussed:  No treatment and alternative treatment Location:    Type:  Abscess   Size:  5   Location:  Neck Pre-procedure details:    Skin preparation:  Betadine Anesthesia (see MAR for exact dosages):    Anesthesia method:  Local infiltration   Local anesthetic:  Lidocaine 1% w/o epi Procedure type:    Complexity:  Simple Procedure details:    Needle aspiration: no     Incision types:  Stab incision   Incision depth:  Dermal   Scalpel blade:  11   Wound management:  Probed and deloculated   Drainage:  Bloody and purulent   Drainage amount:  Scant   Wound treatment:  Wound left open   Packing materials:  1/4 in  gauze Post-procedure details:    Patient tolerance of procedure:  Tolerated well, no immediate complications   (including critical care time)  Medications Ordered in ED Medications  lidocaine (PF) (XYLOCAINE) 1 % injection 5 mL (5 mLs Infiltration Given by Other 05/24/16 1805)  oxyCODONE-acetaminophen (PERCOCET/ROXICET) 5-325 MG per tablet 1 tablet (1 tablet Oral Given 05/24/16 1803)  naproxen (NAPROSYN) tablet 375 mg (375 mg Oral Given 05/24/16 1803)     Initial Impression / Assessment and Plan / ED Course  I have reviewed the triage vital signs and the nursing notes.  Pertinent labs & imaging results that were available during my care of the patient were reviewed by me and considered in my medical decision making (see chart for details).     Rash is most consistent with scabies with a possible component separate dermatitis and contact dermatitis. Patient has tried steroids which did not help. In fact, rash became worse. Will treat with permethrin cream and clindamycin for the cellulitis surrounding the purulent abscess. Status post incision and drainage. If symptoms are not improving, patient may take steroids which were also prescribed. Strict return precautions given. Chest x-ray unremarkable. EKG with sinus tachy. Doubt PE/ACS/PNA. CXR favors atelectasis, no fever or leukocytosis. Labs unremarkable. HIV antibody pending. Patient's phone number is [phone].  Final Clinical Impressions(s) / ED Diagnoses   Final diagnoses:  Rash  Abscess    New Prescriptions Discharge Medication List as of 05/24/2016  7:32 PM    START taking these medications   Details  clindamycin (CLEOCIN) 300 MG capsule Take 1 capsule (300 mg total) by mouth every 6 (six) hours., Starting Sun 05/24/2016, Print    permethrin (ELIMITE) 5 % cream Apply cream from head to feet, leave on for 8 to 14 hours, then wash with soap/water, repeat application if living mites present 14 days after initial treatment (FDA  dosage, Print    predniSONE (DELTASONE) 20 MG tablet Take 2 tablets (40 mg total) by mouth daily., Starting Sun 05/24/2016, Print         Sidney Ace, MD 05/25/16 [phone]  Rolan Bucco, MD 05/25/16 1450

## 2016-05-24 NOTE — ED Triage Notes (Signed)
Pt reports dyed his beard 3 weeks ago and has had a red rash to face and gerneralized red bumps. Pt has redness and swelling to neck that is causing shortness of breath onset 2 days ago.

## 2016-05-25 LAB — HIV ANTIBODY (ROUTINE TESTING W REFLEX): HIV Screen 4th Generation wRfx: NONREACTIVE

## 2016-06-06 IMAGING — CT CT HEAD W/O CM
2 series · 16 of 30 positions shown, 20 images · non-contrast
Comparison: None.

CLINICAL DATA: Initial evaluation for confusion, history of bipolar
disorder

EXAM:
CT HEAD WITHOUT CONTRAST
TECHNIQUE: Contiguous axial images were obtained from the base of the skull
through the vertex without intravenous contrast.

[Series 201: head w/o, idose (1) · axial · non-contrast · 0.44mm/px · z∈[+70,+190]mm · 13 of 30 slices shown, 17 images]
[im 3/30  brain]
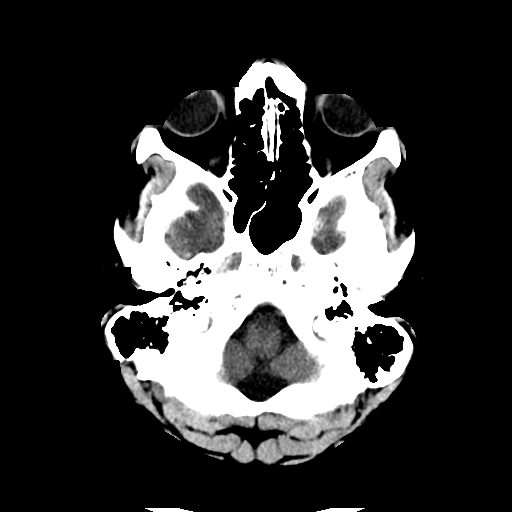
[im 3/30  bone]
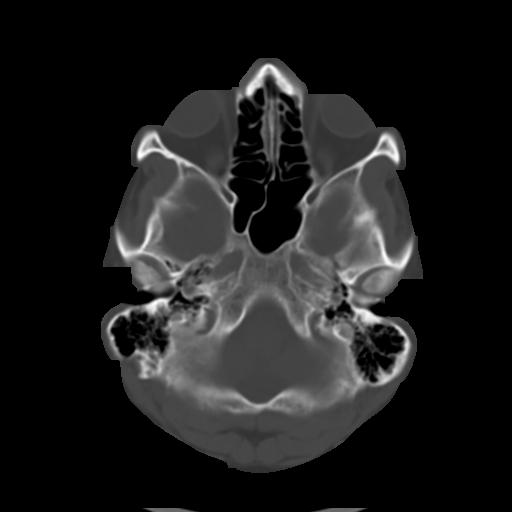
[im 5/30  brain]
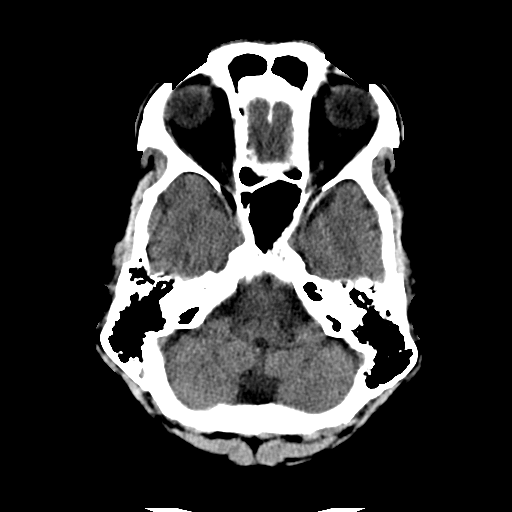
[im 7/30  brain]
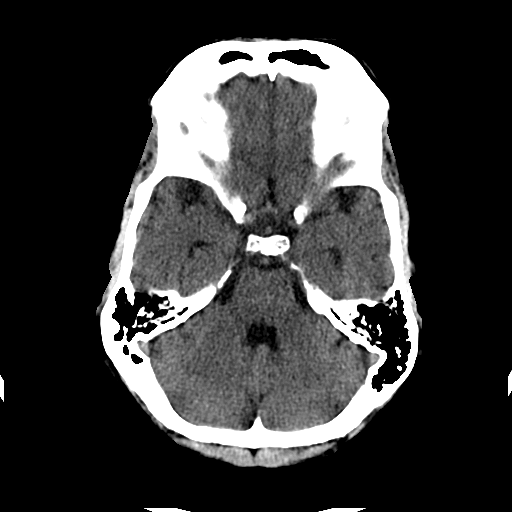
[im 9/30  brain]
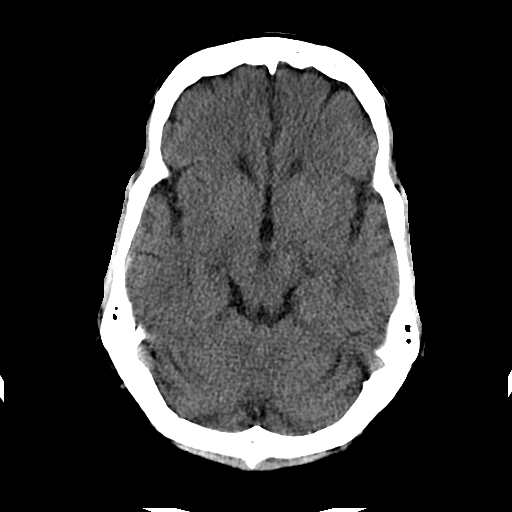
[im 11/30  brain]
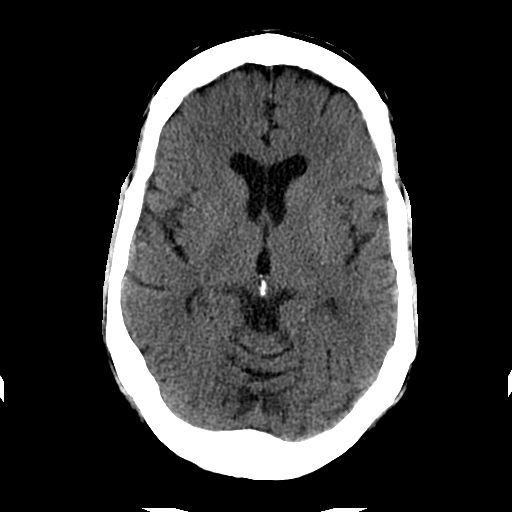
[im 11/30  bone]
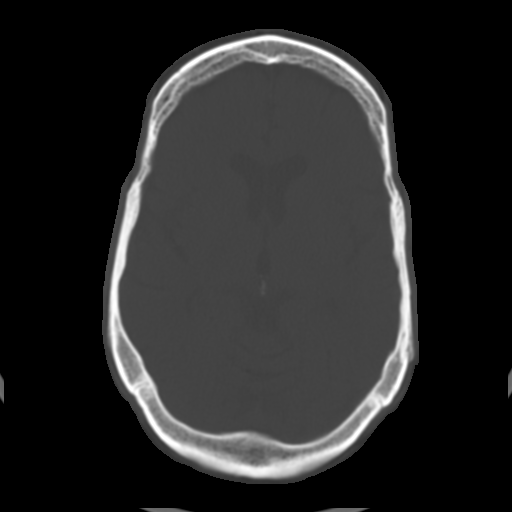
[im 13/30  brain]
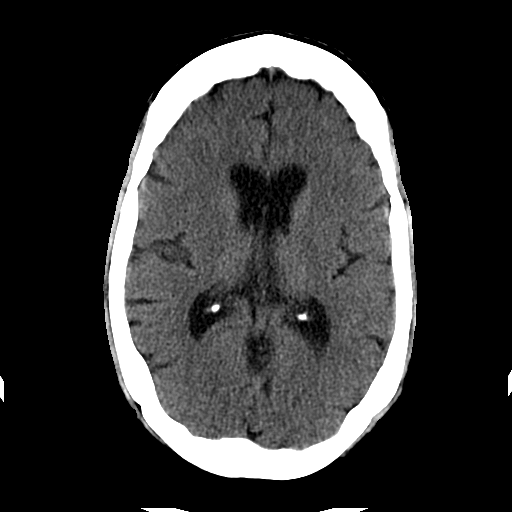
[im 15/30  brain]
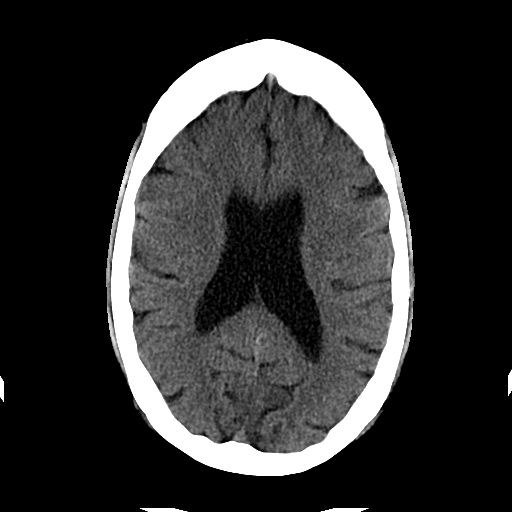
[im 17/30  brain]
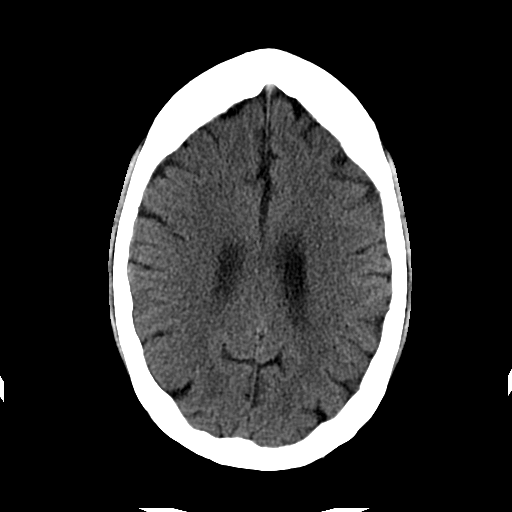
[im 19/30  brain]
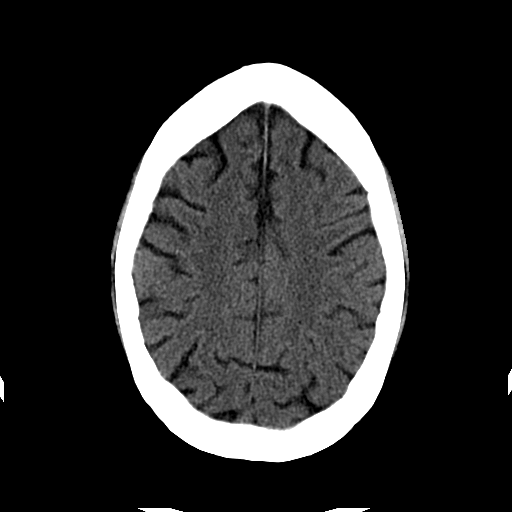
[im 19/30  bone]
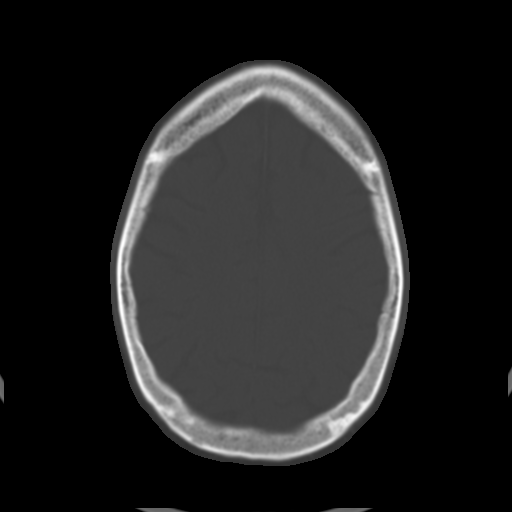
[im 21/30  brain]
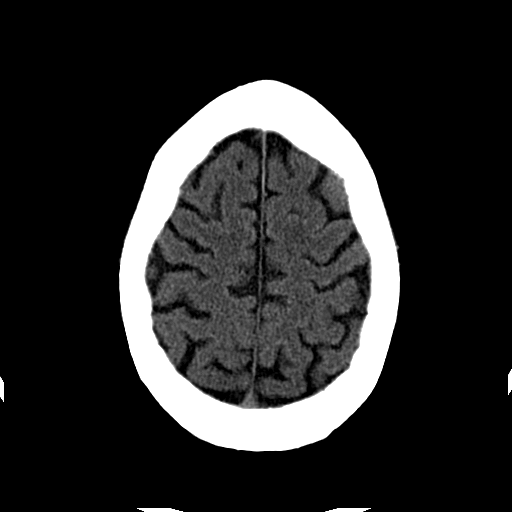
[im 23/30  brain]
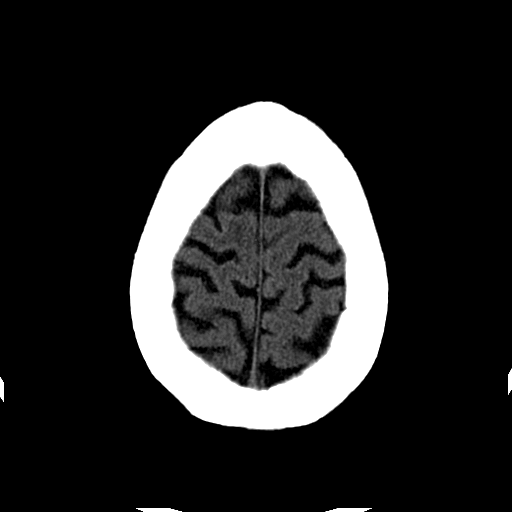
[im 25/30  brain]
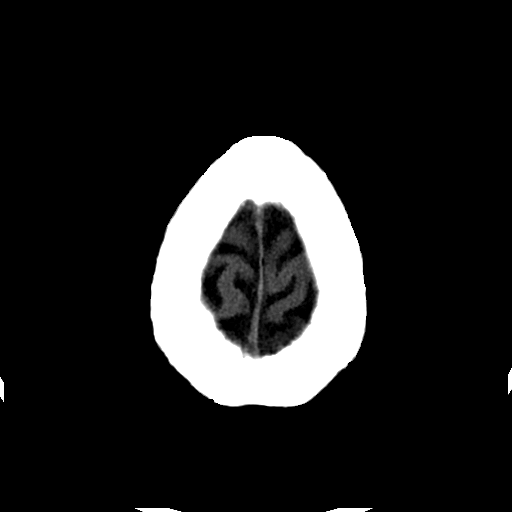
[im 27/30  brain]
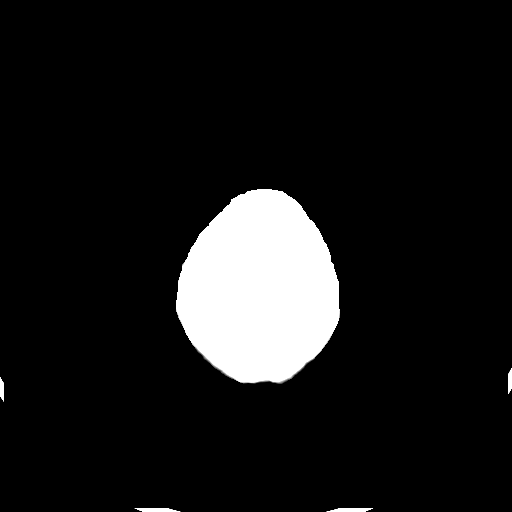
[im 27/30  bone]
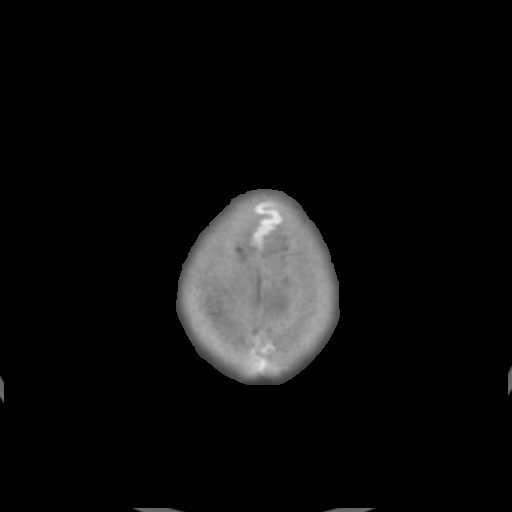

[Series 202: head w/o bone, idose (1) · axial · non-contrast · 0.44mm/px · z∈[+70,+110]mm · 3 of 30 slices shown]
[im 3/30  bone]
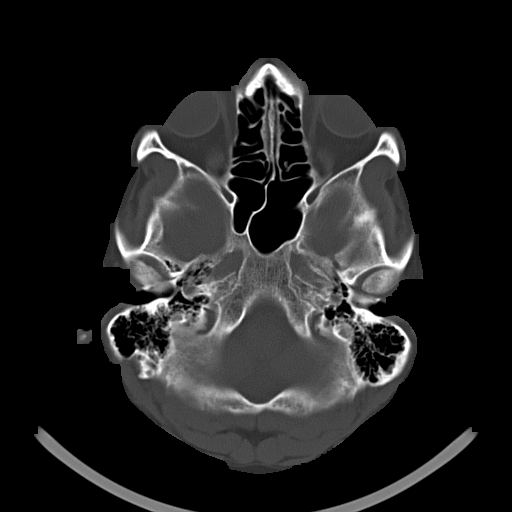
[im 7/30  bone]
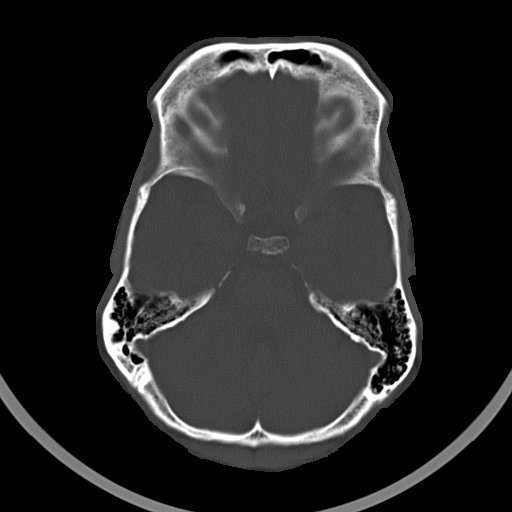
[im 11/30  bone]
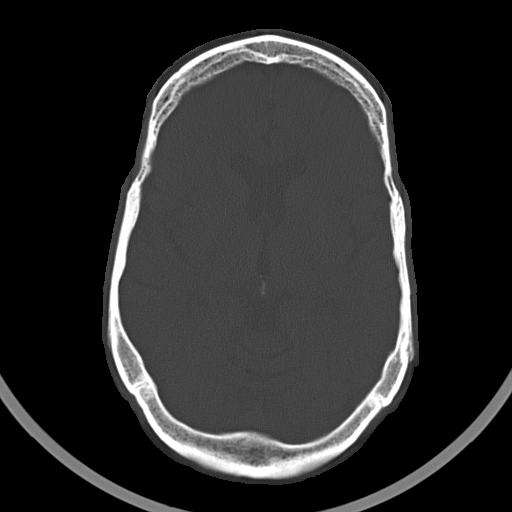

[16 of 30 positions shown; findings below may reference images not displayed]

FINDINGS: Moderate diffuse atrophy. Normal attenuation with no evidence of
mass hemorrhage infarct or extra-axial fluid. No hydrocephalus.
Calvarium intact. No significant inflammatory change in the
visualized portions of the sinuses.
IMPRESSION: Atrophy with no acute finding

## 2016-06-09 ENCOUNTER — Ambulatory Visit (HOSPITAL_COMMUNITY)
Admission: EM | Admit: 2016-06-09 | Discharge: 2016-06-09 | Disposition: A | Discharge status: Home / Self Care | Payer: PRIVATE HEALTH INSURANCE | Attending: Family Medicine | Admitting: Family Medicine

## 2016-06-09 ENCOUNTER — Encounter (HOSPITAL_COMMUNITY): Payer: Self-pay | Admitting: Emergency Medicine

## 2016-06-09 DIAGNOSIS — R197 Diarrhea, unspecified: Secondary | ICD-10-CM | POA: Diagnosis not present

## 2016-06-09 MED ORDER — DIPHENOXYLATE-ATROPINE 2.5-0.025 MG PO TABS: 2.0000 | ORAL_TABLET | Freq: Four times a day (QID) | ORAL | 1 refills | Status: DC | PRN

## 2016-06-09 NOTE — ED Provider Notes (Signed)
[facility]    CSN: [id number] Arrival date & time: 06/09/16  1433     History   Chief Complaint Chief Complaint  Patient presents with  . Diarrhea    HPI Victor Long is a 56 y.o. male.   The history is provided by the patient and a parent.  Diarrhea  Quality:  Watery and blood-tinged Severity:  Moderate Onset quality:  Gradual Duration:  12 months Progression:  Worsening Relieved by:  Nothing Worsened by:  Nothing Ineffective treatments:  None tried Associated symptoms: no fever and no vomiting     Past Medical History:  Diagnosis Date  . Back pain, chronic   . Bipolar disorder (HCC)   . Depression   . Diverticulitis   . Hypertension   . Sciatica     Patient Active Problem List   Diagnosis Date Noted  . Bipolar disorder (HCC) 01/09/2016  . Insomnia 01/09/2016  . Diverticulitis 01/04/2016  . BRBPR (bright red blood per rectum) 01/04/2016  . ETOH abuse 01/04/2016    Past Surgical History:  Procedure Laterality Date  . BACK SURGERY    . SHOULDER SURGERY         Home Medications    Prior to Admission medications   Medication Sig Start Date End Date Taking? Authorizing Provider  divalproex (DEPAKOTE) 500 MG DR tablet Take 500 mg by mouth 3 (three) times daily.   Yes Historical Provider, MD  loperamide (IMODIUM) 2 MG capsule Take 1 capsule (2 mg total) by mouth 4 (four) times daily as needed for diarrhea or loose stools. 04/24/16  Yes Fayrene Helper, PA-C  predniSONE (DELTASONE) 20 MG tablet Take 2 tablets (40 mg total) by mouth daily. 05/24/16  Yes Sidney Ace, MD  clindamycin (CLEOCIN) 300 MG capsule Take 1 capsule (300 mg total) by mouth every 6 (six) hours. Patient not taking: Reported on 06/09/2016 05/24/16   Sidney Ace, MD  diphenoxylate-atropine (LOMOTIL) 2.5-0.025 MG tablet Take 2 tablets by mouth 4 (four) times daily as needed for diarrhea or loose stools. 06/09/16   Linna Hoff, MD  hydrocortisone cream 1 % Apply 1  application topically 2 (two) times daily.    Historical Provider, MD  ketoconazole (NIZORAL) 2 % cream Apply 1 application topically 2 (two) times daily.    Historical Provider, MD  naproxen (NAPROSYN) 500 MG tablet Take 500 mg by mouth daily as needed (for back pain).    Historical Provider, MD  permethrin (ELIMITE) 5 % cream Apply cream from head to feet, leave on for 8 to 14 hours, then wash with soap/water, repeat application if living mites present 14 days after initial treatment (FDA dosage 05/24/16   Sidney Ace, MD  traZODone (DESYREL) 100 MG tablet Take 100 mg by mouth at bedtime as needed for sleep.  05/01/16   Historical Provider, MD    Family History Family History  Problem Relation Age of Onset  . Cancer Father     Social History Social History  Substance Use Topics  . Smoking status: Never Smoker  . Smokeless tobacco: Current User    Types: Chew  . Alcohol use 3.6 - 7.2 oz/week    6 - 12 Cans of beer per week     Allergies   Other   Review of Systems Review of Systems  Constitutional: Negative.  Negative for fever.  HENT: Negative.   Respiratory: Negative.   Cardiovascular: Negative.   Gastrointestinal: Positive for diarrhea. Negative for constipation, nausea, rectal pain  and vomiting.  Genitourinary: Negative.   All other systems reviewed and are negative.    Physical Exam Triage Vital Signs ED Triage Vitals [06/09/16 1503]  Enc Vitals Group     BP 114/77     Pulse Rate 108     Resp 18     Temp 97.6 F (36.4 C)     Temp Source Oral     SpO2 97 %     Weight      Height      Head Circumference      Peak Flow      Pain Score 8     Pain Loc      Pain Edu?      Excl. in GC?    No data found.   Updated Vital Signs BP 114/77 (BP Location: Right Arm)   Pulse 108   Temp 97.6 F (36.4 C) (Oral)   Resp 18   SpO2 97%   Visual Acuity Right Eye Distance:   Left Eye Distance:   Bilateral Distance:    Right Eye Near:   Left Eye Near:      Bilateral Near:     Physical Exam  Constitutional: He appears well-developed and well-nourished. He appears distressed.  Pulmonary/Chest: Effort normal and breath sounds normal.  Abdominal: Soft. Bowel sounds are normal. He exhibits no distension and no mass. There is no tenderness. There is no guarding.  Musculoskeletal: Normal range of motion.  Neurological: He is alert.  Skin: Skin is warm and dry.  Nursing note and vitals reviewed.    UC Treatments / Results  Labs (all labs ordered are listed, but only abnormal results are displayed) Labs Reviewed - No data to display  EKG  EKG Interpretation None       Radiology No results found.  Procedures Procedures (including critical care time)  Medications Ordered in UC Medications - No data to display   Initial Impression / Assessment and Plan / UC Course  I have reviewed the triage vital signs and the nursing notes.  Pertinent labs & imaging results that were available during my care of the patient were reviewed by me and considered in my medical decision making (see chart for details).       Final Clinical Impressions(s) / UC Diagnoses   Final diagnoses:  Diarrhea, unspecified type    New Prescriptions New Prescriptions   DIPHENOXYLATE-ATROPINE (LOMOTIL) 2.5-0.025 MG TABLET    Take 2 tablets by mouth 4 (four) times daily as needed for diarrhea or loose stools.     Linna HoffJames D Naiara Lombardozzi, MD 06/09/16 1539

## 2016-06-09 NOTE — ED Triage Notes (Signed)
Diarrhea for 2 YEARS,  Diagnosed with diverticulitis.

## 2016-06-11 ENCOUNTER — Emergency Department (HOSPITAL_COMMUNITY): Payer: Self-pay

## 2016-06-11 ENCOUNTER — Encounter (HOSPITAL_COMMUNITY): Payer: Self-pay

## 2016-06-11 ENCOUNTER — Emergency Department (HOSPITAL_COMMUNITY)
Admission: EM | Admit: 2016-06-11 | Discharge: 2016-06-11 | Disposition: A | Discharge status: Home / Self Care | Payer: Self-pay | Attending: Emergency Medicine | Admitting: Emergency Medicine

## 2016-06-11 DIAGNOSIS — I10 Essential (primary) hypertension: Secondary | ICD-10-CM | POA: Insufficient documentation

## 2016-06-11 DIAGNOSIS — Z79899 Other long term (current) drug therapy: Secondary | ICD-10-CM | POA: Insufficient documentation

## 2016-06-11 DIAGNOSIS — K529 Noninfective gastroenteritis and colitis, unspecified: Secondary | ICD-10-CM

## 2016-06-11 DIAGNOSIS — R1032 Left lower quadrant pain: Secondary | ICD-10-CM | POA: Insufficient documentation

## 2016-06-11 DIAGNOSIS — R197 Diarrhea, unspecified: Secondary | ICD-10-CM | POA: Insufficient documentation

## 2016-06-11 LAB — COMPREHENSIVE METABOLIC PANEL
ALT: 55 U/L (ref 17–63)
AST: 61 U/L — ABNORMAL HIGH (ref 15–41)
Albumin: 3.7 g/dL (ref 3.5–5.0)
Alkaline Phosphatase: 70 U/L (ref 38–126)
Anion gap: 7 (ref 5–15)
BUN: 12 mg/dL (ref 6–20)
CO2: 26 mmol/L (ref 22–32)
Calcium: 9.2 mg/dL (ref 8.9–10.3)
Chloride: 104 mmol/L (ref 101–111)
Creatinine, Ser: 1.01 mg/dL (ref 0.61–1.24)
GFR calc Af Amer: >60 mL/min (ref >60)
GFR calc non Af Amer: >60 mL/min (ref >60)
Glucose, Bld: 162 mg/dL — ABNORMAL HIGH (ref 65–99)
Potassium: 3.6 mmol/L (ref 3.5–5.1)
Sodium: 137 mmol/L (ref 135–145)
Total Bilirubin: 0.9 mg/dL (ref 0.3–1.2)
Total Protein: 7 g/dL (ref 6.5–8.1)

## 2016-06-11 LAB — URINALYSIS, ROUTINE W REFLEX MICROSCOPIC
Bilirubin Urine: NEGATIVE
Glucose, UA: 150 mg/dL — AB
Hgb urine dipstick: NEGATIVE
Ketones, ur: 20 mg/dL — AB
Leukocytes, UA: NEGATIVE
Nitrite: NEGATIVE
Protein, ur: NEGATIVE mg/dL
Specific Gravity, Urine: 1.024 (ref 1.005–1.030)
pH: 5 (ref 5.0–8.0)

## 2016-06-11 LAB — CBC
HCT: 46.9 % (ref 39.0–52.0)
Hemoglobin: 16.2 g/dL (ref 13.0–17.0)
MCH: 30.9 pg (ref 26.0–34.0)
MCHC: 34.5 g/dL (ref 30.0–36.0)
MCV: 89.3 fL (ref 78.0–100.0)
Platelets: 295 10*3/uL (ref 150–400)
RBC: 5.25 MIL/uL (ref 4.22–5.81)
RDW: 13.4 % (ref 11.5–15.5)
WBC: 8.4 10*3/uL (ref 4.0–10.5)

## 2016-06-11 LAB — I-STAT CG4 LACTIC ACID, ED: Lactic Acid, Venous: 1.79 mmol/L (ref 0.5–1.9)

## 2016-06-11 LAB — LIPASE, BLOOD: Lipase: 27 U/L (ref 11–51)

## 2016-06-11 MED ORDER — SODIUM CHLORIDE 0.9 % IV BOLUS (SEPSIS)
1000.0000 mL | Freq: Once | INTRAVENOUS | Status: AC
  Administered 2016-06-11: 1000 mL via INTRAVENOUS

## 2016-06-11 MED ORDER — IOPAMIDOL (ISOVUE-300) INJECTION 61%
INTRAVENOUS | Status: AC
  Administered 2016-06-11: 100 mL
  Filled 2016-06-11: qty 100

## 2016-06-11 MED ORDER — DIPHENOXYLATE-ATROPINE 2.5-0.025 MG PO TABS: 1.0000 | ORAL_TABLET | Freq: Four times a day (QID) | ORAL | 0 refills | Status: DC | PRN

## 2016-06-11 MED ORDER — LIDOCAINE 5 % EX OINT
TOPICAL_OINTMENT | Freq: Once | CUTANEOUS | Status: AC
  Administered 2016-06-11: 17:00:00 via TOPICAL
  Filled 2016-06-11: qty 35.44

## 2016-06-11 MED ORDER — LACTATED RINGERS IV BOLUS (SEPSIS)
1000.0000 mL | Freq: Once | INTRAVENOUS | Status: AC
  Administered 2016-06-11: 1000 mL via INTRAVENOUS

## 2016-06-11 MED ORDER — LORAZEPAM 2 MG/ML IJ SOLN
1.0000 mg | Freq: Once | INTRAMUSCULAR | Status: AC
  Administered 2016-06-11: 1 mg via INTRAVENOUS
  Filled 2016-06-11: qty 1

## 2016-06-11 MED ORDER — MORPHINE SULFATE (PF) 4 MG/ML IV SOLN
4.0000 mg | Freq: Once | INTRAVENOUS | Status: AC
  Administered 2016-06-11: 4 mg via INTRAVENOUS
  Filled 2016-06-11: qty 1

## 2016-06-11 MED ORDER — LIDOCAINE 4 % EX CREA
TOPICAL_CREAM | Freq: Once | CUTANEOUS | Status: DC
  Filled 2016-06-11: qty 5

## 2016-06-11 MED ORDER — DICYCLOMINE HCL 20 MG PO TABS
20.0000 mg | ORAL_TABLET | Freq: Three times a day (TID) | ORAL | 0 refills | Status: DC
Start: 2016-06-11 — End: 2016-07-02

## 2016-06-11 MED ORDER — ONDANSETRON HCL 4 MG/2ML IJ SOLN
4.0000 mg | Freq: Once | INTRAMUSCULAR | Status: AC
  Administered 2016-06-11: 4 mg via INTRAVENOUS
  Filled 2016-06-11: qty 2

## 2016-06-11 MED ORDER — HYDROCODONE-ACETAMINOPHEN 5-325 MG PO TABS: 1.0000 | ORAL_TABLET | Freq: Four times a day (QID) | ORAL | 0 refills | Status: DC | PRN

## 2016-06-11 NOTE — ED Provider Notes (Signed)
[facility]-EMERGENCY DEPT Provider Note   CSN: [id number] Arrival date & time: 06/11/16  1323     History   Chief Complaint Chief Complaint  Patient presents with  . Diarrhea    HPI Victor Long is a 56 y.o. male.  HPI 56 year old male with history of chronic diarrhea for the last month and a half who presents with acute on chronic diarrhea. Patient states that for the last 1.5 months, he has had persistent, watery, nonbloody diarrhea. He has had decreased appetite as well and has lost a significant amount of weight. He has been seeing his primary care doctor for this and is currently being referred to a GI specialist. He states that over the last several days, his diarrhea has worsened. He has no other acute changes in his symptoms. No blood in his bowel. No nausea or vomiting. Denies any fever. He does have intermittent cramp-like abdominal pain but this has also been constant. Denies any recent medication changes. He was previously diagnosed with diverticulitis and states his pain is not as severe as this. Denies any relieving or aggravating factors.   Past Medical History:  Diagnosis Date  . Back pain, chronic   . Bipolar disorder (HCC)   . Depression   . Diverticulitis   . Hypertension   . Sciatica     Patient Active Problem List   Diagnosis Date Noted  . Bipolar disorder (HCC) 01/09/2016  . Insomnia 01/09/2016  . Diverticulitis 01/04/2016  . BRBPR (bright red blood per rectum) 01/04/2016  . ETOH abuse 01/04/2016    Past Surgical History:  Procedure Laterality Date  . BACK SURGERY    . SHOULDER SURGERY         Home Medications    Prior to Admission medications   Medication Sig Start Date End Date Taking? Authorizing Provider  clindamycin (CLEOCIN) 300 MG capsule Take 1 capsule (300 mg total) by mouth every 6 (six) hours. Patient not taking: Reported on 06/09/2016 05/24/16   Sidney Ace, MD  dicyclomine (BENTYL) 20 MG tablet Take 1 tablet (20 mg total)  by mouth 4 (four) times daily -  before meals and at bedtime. 06/11/16 06/21/16  Shaune Pollack, MD  diphenoxylate-atropine (LOMOTIL) 2.5-0.025 MG tablet Take 1 tablet by mouth 4 (four) times daily as needed for diarrhea or loose stools. 06/11/16   Shaune Pollack, MD  divalproex (DEPAKOTE) 500 MG DR tablet Take 500 mg by mouth 3 (three) times daily.    Historical Provider, MD  HYDROcodone-acetaminophen (NORCO/VICODIN) 5-325 MG tablet Take 1 tablet by mouth every 6 (six) hours as needed for severe pain. 06/11/16   Shaune Pollack, MD  hydrocortisone cream 1 % Apply 1 application topically 2 (two) times daily.    Historical Provider, MD  ketoconazole (NIZORAL) 2 % cream Apply 1 application topically 2 (two) times daily.    Historical Provider, MD  loperamide (IMODIUM) 2 MG capsule Take 1 capsule (2 mg total) by mouth 4 (four) times daily as needed for diarrhea or loose stools. 04/24/16   Fayrene Helper, PA-C  naproxen (NAPROSYN) 500 MG tablet Take 500 mg by mouth daily as needed (for back pain).    Historical Provider, MD  permethrin (ELIMITE) 5 % cream Apply cream from head to feet, leave on for 8 to 14 hours, then wash with soap/water, repeat application if living mites present 14 days after initial treatment (FDA dosage 05/24/16   Sidney Ace, MD  predniSONE (DELTASONE) 20 MG tablet Take 2 tablets (40  mg total) by mouth daily. 05/24/16   Sidney AceAlison Charruf Ruch, MD  traZODone (DESYREL) 100 MG tablet Take 100 mg by mouth at bedtime as needed for sleep.  05/01/16   Historical Provider, MD    Family History Family History  Problem Relation Age of Onset  . Cancer Father     Social History Social History  Substance Use Topics  . Smoking status: Never Smoker  . Smokeless tobacco: Current User    Types: Chew  . Alcohol use 3.6 - 7.2 oz/week    6 - 12 Cans of beer per week     Allergies   Other   Review of Systems Review of Systems  Constitutional: Positive for fatigue. Negative for chills and  fever.  HENT: Negative for congestion and rhinorrhea.   Eyes: Negative for visual disturbance.  Respiratory: Negative for cough, shortness of breath and wheezing.   Cardiovascular: Negative for chest pain and leg swelling.  Gastrointestinal: Positive for abdominal pain and diarrhea. Negative for nausea and vomiting.  Genitourinary: Negative for dysuria and flank pain.  Musculoskeletal: Negative for neck pain and neck stiffness.  Skin: Negative for rash and wound.  Allergic/Immunologic: Negative for immunocompromised state.  Neurological: Positive for light-headedness (Upon standing). Negative for syncope, weakness and headaches.  All other systems reviewed and are negative.    Physical Exam Updated Vital Signs BP 139/85 (BP Location: Left Arm)   Pulse 120   Temp 98.4 F (36.9 C)   Resp 18   SpO2 100%   Physical Exam  Constitutional: He is oriented to person, place, and time. He appears well-developed and well-nourished. No distress.  HENT:  Head: Normocephalic and atraumatic.  Moderately dry mucous membranes  Eyes: Conjunctivae are normal.  Neck: Neck supple.  Cardiovascular: Normal rate, regular rhythm and normal heart sounds.  Exam reveals no friction rub.   No murmur heard. Pulmonary/Chest: Effort normal and breath sounds normal. No respiratory distress. He has no wheezes. He has no rales.  Abdominal: Soft. He exhibits no distension. Bowel sounds are increased. There is tenderness in the right lower quadrant, suprapubic area and left lower quadrant.  Musculoskeletal: He exhibits no edema.  Neurological: He is alert and oriented to person, place, and time. He exhibits normal muscle tone.  Skin: Skin is warm. Capillary refill takes less than 2 seconds.  Psychiatric: He has a normal mood and affect.  Nursing note and vitals reviewed.    ED Treatments / Results  Labs (all labs ordered are listed, but only abnormal results are displayed) Labs Reviewed  COMPREHENSIVE  METABOLIC PANEL - Abnormal; Notable for the following:       Result Value   Glucose, Bld 162 (*)    AST 61 (*)    All other components within normal limits  URINALYSIS, ROUTINE W REFLEX MICROSCOPIC - Abnormal; Notable for the following:    Glucose, UA 150 (*)    Ketones, ur 20 (*)    All other components within normal limits  GASTROINTESTINAL PANEL BY PCR, STOOL (REPLACES STOOL CULTURE)  LIPASE, BLOOD  CBC  I-STAT CG4 LACTIC ACID, ED    EKG  EKG Interpretation None       ED ECG REPORT   Date: 06/11/2016  Rate: 110  Rhythm: sinus tachycardia  QRS Axis: normal  Intervals: normal  ST/T Wave abnormalities: nonspecific ST changes  Conduction Disutrbances:none  Narrative Interpretation:   Old EKG Reviewed: none available  I have personally reviewed the EKG tracing and agree with the computerized  printout as noted.   Radiology Ct Abdomen Pelvis W Contrast  Result Date: 06/11/2016 CLINICAL DATA:  Left lower quadrant pain. History of diverticulitis. EXAM: CT ABDOMEN AND PELVIS WITH CONTRAST TECHNIQUE: Multidetector CT imaging of the abdomen and pelvis was performed using the standard protocol following bolus administration of intravenous contrast. CONTRAST:  100 cc Isovue-300 intravenous COMPARISON:  04/18/2016 FINDINGS: Lower chest:  No contributory findings. Hepatobiliary: Small benign subcapsular low-density in the central liver, stable.No evidence of biliary obstruction or stone. Pancreas: Unremarkable. Spleen: Unremarkable. Adrenals/Urinary Tract: Negative adrenals. No hydronephrosis or stone. Unremarkable bladder. Stomach/Bowel: No obstruction. Moderate diverticulosis of the colon, mainly sigmoid. No appendicitis. Vascular/Lymphatic: No acute vascular abnormality. Atherosclerotic calcification of the aorta. No mass or adenopathy. Reproductive:No acute finding. Dystrophic calcifications along the penis. Other: No ascites or pneumoperitoneum.  Small fatty umbilical hernia  Musculoskeletal: Remote left posterior rib fractures. Small benign sclerotic foci in the bilateral ilium. Spondylosis and generalized disc narrowing, greatest at L5-S1. Lower lumbar facet arthropathy with biforaminal stenosis at L5-S1. IMPRESSION: 1. No acute finding or change compared to study 2 months ago. 2. Colonic diverticulosis. Electronically Signed   By: Marnee Spring M.D.   On: 06/11/2016 16:09    Procedures Procedures (including critical care time)  Medications Ordered in ED Medications  lactated ringers bolus 1,000 mL (1,000 mLs Intravenous New Bag/Given 06/11/16 1641)  sodium chloride 0.9 % bolus 1,000 mL (0 mLs Intravenous Stopped 06/11/16 1641)  sodium chloride 0.9 % bolus 1,000 mL (0 mLs Intravenous Stopped 06/11/16 1641)  lidocaine (XYLOCAINE) 5 % ointment ( Topical Given 06/11/16 1642)  iopamidol (ISOVUE-300) 61 % injection (100 mLs  Contrast Given 06/11/16 1549)  morphine 4 MG/ML injection 4 mg (4 mg Intravenous Given 06/11/16 1641)  ondansetron (ZOFRAN) injection 4 mg (4 mg Intravenous Given 06/11/16 1641)     Initial Impression / Assessment and Plan / ED Course  I have reviewed the triage vital signs and the nursing notes.  Pertinent labs & imaging results that were available during my care of the patient were reviewed by me and considered in my medical decision making (see chart for details).     56 yo M with PMHx of chronic diarrhea here with ongoing diarrhea, mild lightheadedness upon standing. On arrival, pt dehydrated clinically and tachycardic, but o/w HDS. BP wnl. Labs as above and are overall very reassuring - CBC without leukocytosis or anemia, CMP normal with minimal AST elevation (h/o EtOH use), LA normal. Lipase normal. UA with dehydration but no infection. CT scan neg for diverticulitis oro there acute intra-abdominal pathology. Pt given IVF, symptom control in ED with improvement in symptoms and lightheadedness.  Etiology of chronic diarrhea unclear. No leukocytosis,  CT not c/w C. Diff. No abnormalities on CT. Pt does have h/o chronic alcoholism so may be 2/2 malabsorption, poor nutrition. Will refer back to GI. Otherwise, pt feels improved with IVF. He is noted to be persistently, mildly tachycardic - this may be 2/2 anxiety, dehydration. Pt also has possible h/o EtOH w/d so will trial ativan. Plan to continue fluids, symptom control, and d/c home once improved.  Patient care transferred to Dr. Anitra Lauth at the end of my shift. Patient presentation, ED course, and plan of care discussed with review of all pertinent labs and imaging. Please see his/her note for further details regarding further ED course and disposition.   Final Clinical Impressions(s) / ED Diagnoses   Final diagnoses:  Chronic diarrhea    New Prescriptions New Prescriptions   DICYCLOMINE (  BENTYL) 20 MG TABLET    Take 1 tablet (20 mg total) by mouth 4 (four) times daily -  before meals and at bedtime.   DIPHENOXYLATE-ATROPINE (LOMOTIL) 2.5-0.025 MG TABLET    Take 1 tablet by mouth 4 (four) times daily as needed for diarrhea or loose stools.   HYDROCODONE-ACETAMINOPHEN (NORCO/VICODIN) 5-325 MG TABLET    Take 1 tablet by mouth every 6 (six) hours as needed for severe pain.     Shaune Pollack, MD 06/12/16 [phone]

## 2016-06-11 NOTE — ED Notes (Signed)
Pt ambulatory and independent at discharge.  Verbalized understanding of discharge instructions 

## 2016-06-11 NOTE — ED Triage Notes (Signed)
Pt has chronic diarrhea x 2 years.  Went to md 2 days ago and given new meds.  No relief.  Pt went to primary MD today and told to come here for dehydration.  Abdominal pain.  No n/v. No fever.

## 2016-06-11 NOTE — ED Notes (Signed)
Pt reports rectal pain and burning, sts at home he uses lidocaine based rectal ointment and is asking for something similar. MD notified.

## 2016-06-30 ENCOUNTER — Emergency Department (HOSPITAL_COMMUNITY)
Admission: EM | Admit: 2016-06-30 | Discharge: 2016-07-01 | Disposition: A | Discharge status: Other Acute Inpatient Hospital | Payer: PRIVATE HEALTH INSURANCE | Attending: Emergency Medicine | Admitting: Emergency Medicine

## 2016-06-30 ENCOUNTER — Encounter (HOSPITAL_COMMUNITY): Payer: Self-pay | Admitting: Emergency Medicine

## 2016-06-30 DIAGNOSIS — R45851 Suicidal ideations: Secondary | ICD-10-CM | POA: Insufficient documentation

## 2016-06-30 DIAGNOSIS — F1010 Alcohol abuse, uncomplicated: Secondary | ICD-10-CM | POA: Insufficient documentation

## 2016-06-30 DIAGNOSIS — I10 Essential (primary) hypertension: Secondary | ICD-10-CM | POA: Insufficient documentation

## 2016-06-30 DIAGNOSIS — Z79899 Other long term (current) drug therapy: Secondary | ICD-10-CM | POA: Insufficient documentation

## 2016-06-30 DIAGNOSIS — F332 Major depressive disorder, recurrent severe without psychotic features: Secondary | ICD-10-CM | POA: Insufficient documentation

## 2016-06-30 LAB — CBC
HCT: 49.3 % (ref 39.0–52.0)
Hemoglobin: 16.8 g/dL (ref 13.0–17.0)
MCH: 30.4 pg (ref 26.0–34.0)
MCHC: 34.1 g/dL (ref 30.0–36.0)
MCV: 89.2 fL (ref 78.0–100.0)
Platelets: 285 10*3/uL (ref 150–400)
RBC: 5.53 MIL/uL (ref 4.22–5.81)
RDW: 13.4 % (ref 11.5–15.5)
WBC: 6.9 10*3/uL (ref 4.0–10.5)

## 2016-06-30 LAB — COMPREHENSIVE METABOLIC PANEL
ALT: 68 U/L — ABNORMAL HIGH (ref 17–63)
AST: 82 U/L — ABNORMAL HIGH (ref 15–41)
Albumin: 3.7 g/dL (ref 3.5–5.0)
Alkaline Phosphatase: 87 U/L (ref 38–126)
Anion gap: 7 (ref 5–15)
BUN: 8 mg/dL (ref 6–20)
CO2: 25 mmol/L (ref 22–32)
Calcium: 8.5 mg/dL — ABNORMAL LOW (ref 8.9–10.3)
Chloride: 106 mmol/L (ref 101–111)
Creatinine, Ser: 0.85 mg/dL (ref 0.61–1.24)
GFR calc Af Amer: >60 mL/min (ref >60)
GFR calc non Af Amer: >60 mL/min (ref >60)
Glucose, Bld: 116 mg/dL — ABNORMAL HIGH (ref 65–99)
Potassium: 3.5 mmol/L (ref 3.5–5.1)
Sodium: 138 mmol/L (ref 135–145)
Total Bilirubin: 0.8 mg/dL (ref 0.3–1.2)
Total Protein: 7.1 g/dL (ref 6.5–8.1)

## 2016-06-30 LAB — RAPID URINE DRUG SCREEN, HOSP PERFORMED
Amphetamines: NOT DETECTED
Barbiturates: NOT DETECTED
Benzodiazepines: NOT DETECTED
Cocaine: NOT DETECTED
Opiates: NOT DETECTED
Tetrahydrocannabinol: NOT DETECTED

## 2016-06-30 LAB — ETHANOL: Alcohol, Ethyl (B): 213 mg/dL — ABNORMAL HIGH (ref <5)

## 2016-06-30 LAB — ACETAMINOPHEN LEVEL: Acetaminophen (Tylenol), Serum: <10 ug/mL — ABNORMAL LOW (ref 10–30)

## 2016-06-30 LAB — VALPROIC ACID LEVEL: Valproic Acid Lvl: <10 ug/mL — ABNORMAL LOW (ref 50.0–100.0)

## 2016-06-30 LAB — SALICYLATE LEVEL: Salicylate Lvl: <7 mg/dL (ref 2.8–30.0)

## 2016-06-30 MED ORDER — LOPERAMIDE HCL 2 MG PO CAPS: 2.0000 mg | ORAL_CAPSULE | ORAL | Status: DC | PRN

## 2016-06-30 MED ORDER — LORAZEPAM 1 MG PO TABS
1.0000 mg | ORAL_TABLET | Freq: Three times a day (TID) | ORAL | Status: DC
  Filled 2016-06-30: qty 1

## 2016-06-30 MED ORDER — LORAZEPAM 1 MG PO TABS: 1.0000 mg | ORAL_TABLET | Freq: Every day | ORAL | Status: DC

## 2016-06-30 MED ORDER — ADULT MULTIVITAMIN W/MINERALS CH
1.0000 | ORAL_TABLET | Freq: Every day | ORAL | Status: DC
  Administered 2016-06-30 – 2016-07-01 (×2): 1 via ORAL
  Filled 2016-06-30 (×2): qty 1

## 2016-06-30 MED ORDER — LOPERAMIDE HCL 2 MG PO CAPS: 2.0000 mg | ORAL_CAPSULE | Freq: Four times a day (QID) | ORAL | Status: DC | PRN

## 2016-06-30 MED ORDER — LORAZEPAM 1 MG PO TABS: 1.0000 mg | ORAL_TABLET | Freq: Two times a day (BID) | ORAL | Status: DC

## 2016-06-30 MED ORDER — DIPHENOXYLATE-ATROPINE 2.5-0.025 MG PO TABS: 1.0000 | ORAL_TABLET | Freq: Four times a day (QID) | ORAL | Status: DC | PRN

## 2016-06-30 MED ORDER — TRAZODONE HCL 50 MG PO TABS
50.0000 mg | ORAL_TABLET | Freq: Every day | ORAL | Status: DC
  Filled 2016-06-30: qty 1

## 2016-06-30 MED ORDER — HYDROXYZINE HCL 25 MG PO TABS: 25.0000 mg | ORAL_TABLET | Freq: Four times a day (QID) | ORAL | Status: DC | PRN

## 2016-06-30 MED ORDER — TRAZODONE HCL 50 MG PO TABS
50.0000 mg | ORAL_TABLET | Freq: Every evening | ORAL | Status: DC | PRN
  Administered 2016-06-30: 50 mg via ORAL

## 2016-06-30 MED ORDER — ONDANSETRON 4 MG PO TBDP: 4.0000 mg | ORAL_TABLET | Freq: Four times a day (QID) | ORAL | Status: DC | PRN

## 2016-06-30 MED ORDER — DIVALPROEX SODIUM 500 MG PO DR TAB
1500.0000 mg | DELAYED_RELEASE_TABLET | Freq: Every day | ORAL | Status: DC
  Administered 2016-06-30 – 2016-07-01 (×2): 1500 mg via ORAL
  Filled 2016-06-30 (×2): qty 3

## 2016-06-30 MED ORDER — THIAMINE HCL 100 MG/ML IJ SOLN
100.0000 mg | Freq: Once | INTRAMUSCULAR | Status: AC
  Administered 2016-06-30: 100 mg via INTRAMUSCULAR
  Filled 2016-06-30: qty 2

## 2016-06-30 MED ORDER — LORAZEPAM 1 MG PO TABS
1.0000 mg | ORAL_TABLET | Freq: Four times a day (QID) | ORAL | Status: DC
  Administered 2016-06-30 – 2016-07-01 (×2): 1 mg via ORAL
  Filled 2016-06-30: qty 1

## 2016-06-30 MED ORDER — VITAMIN B-1 100 MG PO TABS
100.0000 mg | ORAL_TABLET | Freq: Every day | ORAL | Status: DC
  Administered 2016-07-01: 100 mg via ORAL
  Filled 2016-06-30: qty 1

## 2016-06-30 MED ORDER — LORAZEPAM 1 MG PO TABS
1.0000 mg | ORAL_TABLET | Freq: Four times a day (QID) | ORAL | Status: DC | PRN
  Administered 2016-06-30 – 2016-07-01 (×2): 1 mg via ORAL
  Filled 2016-06-30 (×2): qty 1

## 2016-06-30 NOTE — ED Notes (Signed)
Pt had his I-pod and one set of ear pieces while transferring to [facility]. Pt refused to have the Ipod locked. Pt verbally aggressive yelling at this writer that he is not happy with the rules. Security involved. [facility]

## 2016-06-30 NOTE — ED Notes (Signed)
Pt oriented to room and unit.  Pt is very irritable that his I-pod was taken from him.  When patient was asked if he was suicidal he stated "I'm getting that way"  Pt was checked for contraband and none was found.  15 minute checks and video monitoring in place.

## 2016-06-30 NOTE — ED Triage Notes (Signed)
Pt sts "I am fed up with life ". Pt reports cant live with mother anymore. Pt reports alcohol intoxication everyday. SI with plan to pull up in front of truck .

## 2016-06-30 NOTE — ED Notes (Signed)
Pt resting at present, no distress noted, calm & cooperative.  Monitoring for safety, Q 15 min checks in effect. 

## 2016-06-30 NOTE — ED Notes (Signed)
Pt unable to void at this time, sts voided prior to be  transfered to the unit

## 2016-06-30 NOTE — ED Provider Notes (Signed)
[facility]-EMERG[facility]Y DEPT Provider Note   CSN: [id number] Arrival date & time: 06/30/16  1358     History   Chief Complaint Chief Complaint  Patient presents with  . Suicidal    HPI Victor Long is a 56 y.o. male.  HPI Patient reports that he is very depressed and has had thoughts of killing himself. He reports that he feels like failure because he can't stand taking care of his mother anymore. He reports that he waits on her hand and foot and that she is very dependent. He reports that he told his father before he died in 2000 that he would take care of his mother but now he just feels like he would rather die. He states just today he wanted to drive his car into traffic so that he would get killed and the only thing that stops him from doing so was his dog was in the car. The patient reports he is an alcholic. He states he drinks daily but has never gone into withdrawal or had seizures when he stops drinking. Patient reports he has many medical problems. He reports most recently he tried to dye his beard and developed a severe allergic reaction that caused him to have itching and skin discomfort such that he wanted to die.  He reports he has diverticulitis in his abdomen always hurts also. Past Medical History:  Diagnosis Date  . Back pain, chronic   . Bipolar disorder (HCC)   . Depression   . Diverticulitis   . Hypertension   . Sciatica     Patient Active Problem List   Diagnosis Date Noted  . Bipolar disorder (HCC) 01/09/2016  . Insomnia 01/09/2016  . Diverticulitis 01/04/2016  . BRBPR (bright red blood per rectum) 01/04/2016  . ETOH abuse 01/04/2016    Past Surgical History:  Procedure Laterality Date  . BACK SURGERY    . SHOULDER SURGERY         Home Medications    Prior to Admission medications   Medication Sig Start Date End Date Taking? Authorizing Provider  diphenoxylate-atropine (LOMOTIL) 2.5-0.025 MG tablet Take 1 tablet by mouth 4 (four) times daily  as needed for diarrhea or loose stools. 06/11/16  Yes Shaune Pollackameron Isaacs, MD  divalproex (DEPAKOTE) 500 MG DR tablet Take 1,500 mg by mouth daily.    Yes Historical Provider, MD  loperamide (IMODIUM) 2 MG capsule Take 1 capsule (2 mg total) by mouth 4 (four) times daily as needed for diarrhea or loose stools. 04/24/16  Yes Fayrene HelperBowie Tran, PA-C  Menthol-Zinc Oxide (GOLD BOND EXTRA STRENGTH EX) Apply 1 application topically 5 (five) times daily.   Yes Historical Provider, MD  traZODone (DESYREL) 50 MG tablet Take 50 mg by mouth at bedtime as needed for sleep.   Yes Historical Provider, MD  clindamycin (CLEOCIN) 300 MG capsule Take 1 capsule (300 mg total) by mouth every 6 (six) hours. Patient not taking: Reported on 06/09/2016 05/24/16   Sidney AceAlison Charruf Ruch, MD  dicyclomine (BENTYL) 20 MG tablet Take 1 tablet (20 mg total) by mouth 4 (four) times daily -  before meals and at bedtime. Patient not taking: Reported on 06/30/2016 06/11/16 06/21/16  Shaune Pollackameron Isaacs, MD  HYDROcodone-acetaminophen (NORCO/VICODIN) 5-325 MG tablet Take 1 tablet by mouth every 6 (six) hours as needed for severe pain. Patient not taking: Reported on 06/30/2016 06/11/16   Shaune Pollackameron Isaacs, MD  permethrin (ELIMITE) 5 % cream Apply cream from head to feet, leave on for 8 to 14 hours, then  wash with soap/water, repeat application if living mites present 14 days after initial treatment (FDA dosage Patient not taking: Reported on 06/30/2016 05/24/16   Sidney Ace, MD  predniSONE (DELTASONE) 20 MG tablet Take 2 tablets (40 mg total) by mouth daily. Patient not taking: Reported on 06/30/2016 05/24/16   Sidney Ace, MD    Family History Family History  Problem Relation Age of Onset  . Cancer Father     Social History Social History  Substance Use Topics  . Smoking status: Never Smoker  . Smokeless tobacco: Current User    Types: Chew  . Alcohol use 3.6 - 7.2 oz/week    6 - 12 Cans of beer per week     Allergies   Other   Review  of Systems Review of Systems 10 Systems reviewed and are negative for acute change except as noted in the HPI.   Physical Exam Updated Vital Signs BP 128/79 (BP Location: Right Arm)   Pulse 87   Temp 97.9 F (36.6 C) (Oral)   Resp 18   SpO2 99%   Physical Exam  Constitutional: He is oriented to person, place, and time. He appears well-developed and well-nourished.  HENT:  Head: Normocephalic and atraumatic.  Mouth/Throat: Oropharynx is clear and moist.  Eyes: Conjunctivae and EOM are normal.  Neck: Neck supple.  Cardiovascular: Normal rate and regular rhythm.   No murmur heard. Pulmonary/Chest: Effort normal and breath sounds normal. No respiratory distress.  Abdominal: Soft. Bowel sounds are normal. He exhibits no distension. There is tenderness.  Patient endorses tenderness to palpation of the abdomen throughout but there is no guarding.  Musculoskeletal: He exhibits no edema or tenderness.  Nontender. No peripheral edema.  Neurological: He is alert and oriented to person, place, and time. No cranial nerve deficit. He exhibits normal muscle tone. Coordination normal.  Skin: Skin is warm and dry.  Patient face is shaved with a day or 2 of stubble. He does not have any active rash at this time. There are no pustules or vesicles.  Psychiatric: He has a normal mood and affect.  Nursing note and vitals reviewed.    ED Treatments / Results  Labs (all labs ordered are listed, but only abnormal results are displayed) Labs Reviewed  COMPREHENSIVE METABOLIC PANEL - Abnormal; Notable for the following:       Result Value   Glucose, Bld 116 (*)    Calcium 8.5 (*)    AST 82 (*)    ALT 68 (*)    All other components within normal limits  ETHANOL - Abnormal; Notable for the following:    Alcohol, Ethyl (B) 213 (*)    All other components within normal limits  ACETAMINOPHEN LEVEL - Abnormal; Notable for the following:    Acetaminophen (Tylenol), Serum <10 (*)    All other  components within normal limits  SALICYLATE LEVEL  CBC  RAPID URINE DRUG SCREEN, HOSP PERFORMED  VALPROIC ACID LEVEL    EKG  EKG Interpretation None       Radiology No results found.  Procedures Procedures (including critical care time)  Medications Ordered in ED Medications  thiamine (VITAMIN B-1) tablet 100 mg (not administered)  multivitamin with minerals tablet 1 tablet (1 tablet Oral Given 06/30/16 1852)  LORazepam (ATIVAN) tablet 1 mg (not administered)  hydrOXYzine (ATARAX/VISTARIL) tablet 25 mg (not administered)  loperamide (IMODIUM) capsule 2-4 mg (not administered)  ondansetron (ZOFRAN-ODT) disintegrating tablet 4 mg (not administered)  LORazepam (ATIVAN) tablet 1  mg (1 mg Oral Given 06/30/16 1850)    Followed by  LORazepam (ATIVAN) tablet 1 mg (not administered)    Followed by  LORazepam (ATIVAN) tablet 1 mg (not administered)    Followed by  LORazepam (ATIVAN) tablet 1 mg (not administered)  traZODone (DESYREL) tablet 50 mg (not administered)  diphenoxylate-atropine (LOMOTIL) 2.5-0.025 MG per tablet 1 tablet (not administered)  divalproex (DEPAKOTE) DR tablet 1,500 mg (not administered)  traZODone (DESYREL) tablet 50 mg (not administered)  loperamide (IMODIUM) capsule 2 mg (not administered)  thiamine (B-1) injection 100 mg (100 mg Intramuscular Given 06/30/16 1857)     Initial Impression / Assessment and Plan / ED Course  I have reviewed the triage vital signs and the nursing notes.  Pertinent labs & imaging results that were available during my care of the patient were reviewed by me and considered in my medical decision making (see chart for details).      Final Clinical Impressions(s) / ED Diagnoses   Final diagnoses:  Alcohol abuse  Suicidal ideation  Patient reports that he is having suicidal thoughts. He is experiencing situational and home life problems. Patient also has problems alcoholism. He is alert and nontoxic. No indication at this  time of acute medical illness. Patient reports regular alcohol use but no history of withdrawal. Patient is not showing any signs of withdrawal at this time. Patient is medically cleared for psychiatric evaluation and disposition.  New Prescriptions New Prescriptions   No medications on file     Arby Barrette, MD 06/30/16 2026
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
## 2016-07-01 ENCOUNTER — Encounter (HOSPITAL_COMMUNITY): Payer: Self-pay

## 2016-07-01 ENCOUNTER — Inpatient Hospital Stay (HOSPITAL_COMMUNITY)
Admission: EM | Admit: 2016-07-01 | Discharge: 2016-07-02 | DRG: 885 | Disposition: A | Discharge status: Home / Self Care | Payer: No Typology Code available for payment source | Source: Intra-hospital | Attending: Psychiatry | Admitting: Psychiatry

## 2016-07-01 DIAGNOSIS — F1020 Alcohol dependence, uncomplicated: Secondary | ICD-10-CM | POA: Diagnosis present

## 2016-07-01 DIAGNOSIS — F3130 Bipolar disorder, current episode depressed, mild or moderate severity, unspecified: Secondary | ICD-10-CM | POA: Diagnosis not present

## 2016-07-01 DIAGNOSIS — Z79899 Other long term (current) drug therapy: Secondary | ICD-10-CM | POA: Diagnosis not present

## 2016-07-01 DIAGNOSIS — F319 Bipolar disorder, unspecified: Principal | ICD-10-CM | POA: Diagnosis present

## 2016-07-01 MED ORDER — NICOTINE POLACRILEX 2 MG MT GUM: 2.0000 mg | CHEWING_GUM | OROMUCOSAL | Status: DC | PRN

## 2016-07-01 MED ORDER — LORAZEPAM 1 MG PO TABS: 1.0000 mg | ORAL_TABLET | Freq: Two times a day (BID) | ORAL | Status: DC

## 2016-07-01 MED ORDER — LORAZEPAM 1 MG PO TABS
1.0000 mg | ORAL_TABLET | Freq: Four times a day (QID) | ORAL | Status: AC
  Administered 2016-07-01 (×3): 1 mg via ORAL
  Filled 2016-07-01 (×4): qty 1

## 2016-07-01 MED ORDER — NICOTINE 21 MG/24HR TD PT24
21.0000 mg | MEDICATED_PATCH | Freq: Every day | TRANSDERMAL | Status: DC
  Administered 2016-07-01 – 2016-07-02 (×2): 21 mg via TRANSDERMAL
  Filled 2016-07-01 (×3): qty 1

## 2016-07-01 MED ORDER — LORAZEPAM 1 MG PO TABS: 1.0000 mg | ORAL_TABLET | Freq: Every day | ORAL | Status: DC

## 2016-07-01 MED ORDER — DIPHENHYDRAMINE-ZINC ACETATE 2-0.1 % EX CREA
TOPICAL_CREAM | Freq: Three times a day (TID) | CUTANEOUS | Status: DC | PRN
  Administered 2016-07-01: 17:00:00 via TOPICAL
  Filled 2016-07-01: qty 28

## 2016-07-01 MED ORDER — ACETAMINOPHEN 325 MG PO TABS: 650.0000 mg | ORAL_TABLET | Freq: Four times a day (QID) | ORAL | Status: DC | PRN

## 2016-07-01 MED ORDER — ALUM & MAG HYDROXIDE-SIMETH 200-200-20 MG/5ML PO SUSP: 30.0000 mL | ORAL | Status: DC | PRN

## 2016-07-01 MED ORDER — ADULT MULTIVITAMIN W/MINERALS CH
1.0000 | ORAL_TABLET | Freq: Every day | ORAL | Status: DC
  Administered 2016-07-01 – 2016-07-02 (×2): 1 via ORAL
  Filled 2016-07-01 (×5): qty 1

## 2016-07-01 MED ORDER — TRAZODONE HCL 100 MG PO TABS
100.0000 mg | ORAL_TABLET | Freq: Every evening | ORAL | Status: DC | PRN
  Administered 2016-07-01: 100 mg via ORAL
  Filled 2016-07-01 (×5): qty 1

## 2016-07-01 MED ORDER — LORAZEPAM 1 MG PO TABS
1.0000 mg | ORAL_TABLET | Freq: Three times a day (TID) | ORAL | Status: DC
  Administered 2016-07-02: 1 mg via ORAL
  Filled 2016-07-01: qty 1

## 2016-07-01 MED ORDER — LOPERAMIDE HCL 2 MG PO CAPS: 2.0000 mg | ORAL_CAPSULE | ORAL | Status: DC | PRN

## 2016-07-01 MED ORDER — MAGNESIUM HYDROXIDE 400 MG/5ML PO SUSP: 30.0000 mL | Freq: Every day | ORAL | Status: DC | PRN

## 2016-07-01 MED ORDER — ONDANSETRON 4 MG PO TBDP: 4.0000 mg | ORAL_TABLET | Freq: Four times a day (QID) | ORAL | Status: DC | PRN

## 2016-07-01 MED ORDER — THIAMINE HCL 100 MG/ML IJ SOLN
100.0000 mg | Freq: Once | INTRAMUSCULAR | Status: AC
  Administered 2016-07-01: 100 mg via INTRAMUSCULAR
  Filled 2016-07-01: qty 2

## 2016-07-01 MED ORDER — LORAZEPAM 1 MG PO TABS: 1.0000 mg | ORAL_TABLET | Freq: Four times a day (QID) | ORAL | Status: DC | PRN

## 2016-07-01 MED ORDER — VITAMIN B-1 100 MG PO TABS
100.0000 mg | ORAL_TABLET | Freq: Every day | ORAL | Status: DC
  Administered 2016-07-02: 100 mg via ORAL
  Filled 2016-07-01 (×3): qty 1

## 2016-07-01 MED ORDER — HYDROXYZINE HCL 25 MG PO TABS
25.0000 mg | ORAL_TABLET | Freq: Four times a day (QID) | ORAL | Status: DC | PRN
  Filled 2016-07-01: qty 10

## 2016-07-01 MED ORDER — IBUPROFEN 200 MG PO TABS
400.0000 mg | ORAL_TABLET | Freq: Once | ORAL | Status: AC
  Administered 2016-07-01: 400 mg via ORAL
  Filled 2016-07-01: qty 2

## 2016-07-01 NOTE — Progress Notes (Signed)
Pt signed a 72-hour request for discharge at 1830. He is upset that he cannot have tobacco products and his iPod on the unit. He feels that these items would provide him comfort and it is unreasonable for unit rules to keep them from him. This Clinical research associate explained the process. He said, "I would kill myself" before coming back to [facility].  At 1900, he approached the desk to ask how his release was coming along. This Clinical research associate explained that he would have to meet with the MD in the a.m and reiterated that the process did not allow for him to simply sign himself out. He again said he would rather kill himself than seek help here again.   Will continue to monitor until change of shift.

## 2016-07-01 NOTE — Progress Notes (Signed)
Pt attended the evening NA speaker meeting. Pt was engaged and appropriate.  Shenell Rogalski C, NT 07/01/16 8:38 PM  

## 2016-07-01 NOTE — ED Notes (Signed)
[facility]  He left the unit ambulatory with [employer].  All belongings given to the transporter.  Report was called to [place], Charity fundraiserN.  Patient was exhibiting alcohol withdrawal symptoms several minutes prior to transport arriving.  He was given an additional mg of lorazepam for the symptoms.

## 2016-07-01 NOTE — Progress Notes (Signed)
Pt denied SI, HI, and AVH. CIWA 8 when Ativan given at 1415 (see MAR). Pt was cooperative and appropriate with this Clinical research associatewriter acting otherwise with admission staff, but he was somewhat irritable. He complained at length about not having access to his smokeless tobacco. In his words, "it wouldn't be harming anybody." Attempted to explain to him in a nonjudgmental manner that this was a healthcare organization's way of discouraging unhealthy activities. He likened such to a dictatorship. Pt was given nicotine gum.

## 2016-07-01 NOTE — Tx Team (Addendum)
Initial Treatment Plan 07/01/2016 3:06 PM Victor Long [id number]    PATIENT STRESSORS: Marital or family conflict Substance abuse   PATIENT STRENGTHS: Average or above average intelligence Communication skills Motivation for treatment/growth   PATIENT IDENTIFIED PROBLEMS: "Regaining control of my drinking"  "Learning how to deal with my mother appropriately"                   DISCHARGE CRITERIA:  Improved stabilization in mood, thinking, and/or behavior  PRELIMINARY DISCHARGE PLAN: Outpatient therapy  PATIENT/FAMILY INVOLVEMENT: This treatment plan has been presented to and reviewed with the patient, Victor Long, and/or family member.  The patient and family have been given the opportunity to ask questions and make suggestions.  Victor Long, Victor Furniss M, RN 07/01/2016, 3:06 PM

## 2016-07-01 NOTE — BH Assessment (Signed)
[facility]HH Assessment Progress Note  Per Thedore MinsMojeed Akintayo, MD, this pt requires psychiatric hospitalization at this time.  Malva LimesLinsey Strader, RN, [id number].  Pt has signed Voluntary Admission and Consent for Treatment, and signed [facility].  Pt's nurse, Rudean HittDawnaly, has been notified, and agrees to send original paperwork along with pt via [facility], and to call report to [phone].  Doylene Canninghomas Bernise Sylvain, MA Triage Specialist [phone]

## 2016-07-01 NOTE — Progress Notes (Addendum)
Nursing Progress Note 7p-7a  D) Patient presents irritable and anxious. Patient is seen interactive in the milieu. Patient states "I don't understand why I can't just go outside and have my tobacco chew. How do they expect us to quit everything cold [place]key? I'm not an animal". Patient states "I'm ready to go. Can I leave tonight?" Patient denies SI/HI/AVH or pain. Patient complains of nicotine withdrawal but denies nicotine patch when writer offers it stating "that won't do shit". Patient contracts for safety on the unit. Patient sedated after ativan but did wake up and requested trazodone at 2252.  A) Emotional support given. 1:1 interaction and active listening provided. Patient medicated with PM orders as prescribed. Medications reviewed with patient. Patient verbalized understanding of medications without further questions.  Snacks and fluids provided. Opportunities for questions or concerns presented to patient. Patient encouraged to continue to work on treatment goals. Labs, vital signs and patient behavior monitored throughout shift. Patient safety maintained with q15 min safety checks.  R) Patient receptive to interaction with nurse. Patient remains safe on the unit at this time. Patient denies any adverse medication reactions at this time. Patient is resting in bed without complaints. Will continue to monitor.

## 2016-07-01 NOTE — Plan of Care (Signed)
Problem: Safety: Goal: Periods of time without injury will increase Outcome: Progressing Patient contracts for safety on the unit and is on q15 minute Level 3 safety checks.

## 2016-07-01 NOTE — Progress Notes (Signed)
Patient requested 100 mg of trazodone for sleep. PA Spencer notified. New orders received. Brought medication to patient but patient is currently sleeping. Will provide if needed.

## 2016-07-01 NOTE — Progress Notes (Signed)
Patient refused to complete admission; admission could not be completed at this time; patient became upset after chewing tobacco was found in his sock; patient started talking forcefully to this Clinical research associatewriter and the technician; patient made verbal threats and stated " I am 56 years old and you are treating me like a child, if you take me down it will take several of you to take me down"

## 2016-07-02 DIAGNOSIS — F1020 Alcohol dependence, uncomplicated: Secondary | ICD-10-CM

## 2016-07-02 DIAGNOSIS — F3130 Bipolar disorder, current episode depressed, mild or moderate severity, unspecified: Secondary | ICD-10-CM

## 2016-07-02 DIAGNOSIS — Z79899 Other long term (current) drug therapy: Secondary | ICD-10-CM

## 2016-07-02 MED ORDER — TRAZODONE HCL 100 MG PO TABS: 100.0000 mg | ORAL_TABLET | Freq: Every evening | ORAL | 0 refills | Status: DC | PRN

## 2016-07-02 MED ORDER — HYDROXYZINE HCL 25 MG PO TABS: 25.0000 mg | ORAL_TABLET | Freq: Four times a day (QID) | ORAL | 0 refills | Status: DC | PRN

## 2016-07-02 MED ORDER — NICOTINE 21 MG/24HR TD PT24: 21.0000 mg | MEDICATED_PATCH | Freq: Every day | TRANSDERMAL | 0 refills | Status: DC

## 2016-07-02 NOTE — B[facility] Suicide Risk Assessment (Signed)
[facility] INPATIENT:  Family/Significant Other Suicide Prevention Education  Suicide Prevention Education:  Patient Refusal for Family/Significant Other Suicide Prevention Education: The patient Victor Long has refused to provide written consent for family/significant other to be provided Family/Significant Other Suicide Prevention Education during admission and/or prior to discharge.  Physician notified.  SPE completed with pt, as pt refused to consent to family contact. SPI pamphlet provided to pt and pt was encouraged to share information with support network, ask questions, and talk about any concerns relating to SPE. Pt denies access to guns/firearms and verbalized understanding of information provided. Mobile Crisis information also provided to pt.   Cadence Haslam N Smart LCSW 07/02/2016, 10:20 AM

## 2016-07-02 NOTE — BHH Counselor (Signed)
Patient is discharging within 24 hours of admission. PSA not required.  Trula SladeHeather Smart, MSW, LCSW Clinical Social Worker 07/02/2016 9:40 AM

## 2016-07-02 NOTE — B[facility] Suicide Risk Assessment (Signed)
[facility] Admission Suicide Risk Assessment   Nursing information obtained from:    Demographic factors:    Current Mental Status:    Loss Factors:    Historical Factors:    Risk Reduction Factors:     Total Time spent with patient: 45 minutes Principal Problem: alcohol use disorder Diagnosis:   Patient Active Problem List   Diagnosis Date Noted  . Bipolar affective disorder, current episode depressed (HCC) [F31.30] 07/01/2016  . Bipolar disorder (HCC) [F31.9] 01/09/2016  . Insomnia [G47.00] 01/09/2016  . Diverticulitis [K57.92] 01/04/2016  . BRBPR (bright red blood per rectum) [K62.5] 01/04/2016  . ETOH abuse [F10.10] 01/04/2016   Subjective Data: Patient is seen and examined. Patient is a 56 YO male with a reported history of bipolar disorder and alcohol use disorder. 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 As well he had a recent episode of diverticulitis which caused him distress, which lead to having a worsening mood and depression. He has a reported history of bipolar disorder as well as alcohol use disorder. He stated that the day prior to admission he had 200 OZ of " 8% alcohol".  He reports that he feels like failure because he can't stand taking care of his mother anymore. He reports that he waits on her hand and foot and that she is very dependent. He reports that he told his father before he died in 1998/08/27 that he would take care of his mother but now he just feels like he would rather die. He states just today he wanted to drive his car into traffic so that he would get killed and the only thing that stops him from doing so was his dog was in the car. The patient reports he is an alcoholic. He states he drinks daily but has never gone into withdrawal or had seizures when he stops drinking. Patient reports he has many medical problems. He reports most recently he tried to dye his beard and developed a severe allergic  reaction that caused him to have itching and skin discomfort such that he wanted to die.  He reports he has diverticulitis in his abdomen always hurts also.  He was admitted to the behavioral health unit and began requesting discharge after he was admitted. He denied SI and we requested that he stay for treatment.     Past Medical History:     Continued Clinical Symptoms:  Alcohol Use Disorder Identification Test Final Score (AUDIT): 35 The "Alcohol Use Disorders Identification Test", Guidelines for Use in Primary Care, Second Edition.  World Science writer [facility]). Score between 0-7:  no or low risk or alcohol related problems. Score between 8-15:  moderate risk of alcohol related problems. Score between 16-19:  high risk of alcohol related problems. Score 20 or above:  warrants further diagnostic evaluation for alcohol dependence and treatment.   CLINICAL FACTORS:   Bipolar Disorder:   Depressive phase Alcohol/Substance Abuse/Dependencies   Musculoskeletal: Strength & Muscle Tone: within normal limits Gait & Station: normal Patient leans: N/A  Psychiatric Specialty Exam: Physical Exam  Nursing note and vitals reviewed.   ROS  Blood pressure 96/77, pulse (!) 103, temperature 98.3 F (36.8 C), temperature source Oral, resp. rate 16, height 5' 4.5" (1.638 m), weight 72.1 kg (159 lb).Body mass index is 26.87 kg/m.  General Appearance: Disheveled  Eye Contact:  Fair  Speech:  Normal Rate  Volume:  Decreased  Mood:  Depressed  Affect:  Appropriate  Thought Process:  Coherent  Orientation:  Full (Time, Place, and Person)  Thought Content:  Logical  Suicidal Thoughts:  No  Homicidal Thoughts:  No  Memory:  Immediate;   Fair  Judgement:  Impaired  Insight:  Lacking  Psychomotor Activity:  Increased  Concentration:  Concentration: Fair  Recall:  [employer]  Fund of Knowledge:  Fair  Language:  Good  Akathisia:  No  Handed:  Right  AIMS (if indicated):     Assets:   Desire for Improvement Social Support  ADL's:  Intact  Cognition:  WNL  Sleep:  Number of Hours: 6      COGNITIVE FEATURES THAT CONTRIBUTE TO RISK:  Closed-mindedness and Thought constriction (tunnel vision)    SUICIDE RISK:   Minimal: No identifiable suicidal ideation.  Patients presenting with no risk factors but with morbid ruminations; may be classified as minimal risk based on the severity of the depressive symptoms  PLAN OF CARE: Patient requested admission, but has decided that he does not like the idea that he cannot use tobacco or listen to "my music". I have explained the danger of ETOH withdrawal as well as the problems mixing alcohol with depakote ( which his labs have shown his LFT's are elevated). We will try and convince him to stay but will discharge AMA if he persists.  I certify that inpatient services furnished can reasonably be expected to improve the patient's condition.   Antonieta PertGreg Lawson Sheyna Pettibone, MD 07/02/2016, 10:35 AM

## 2016-07-02 NOTE — Progress Notes (Signed)
Pt discharged to lobby. Pt was stable and appreciative at that time. All papers, samples and prescriptions were given and valuables returned. Verbal understanding expressed. Denies SI/HI and A/VH. Pt given opportunity to express concerns and ask questions.  

## 2016-07-02 NOTE — Discharge Summary (Signed)
Physician Discharge Summary Note  Patient:  Victor Long is an 56 y.o., male MRN:  [id number] DOB:  10-30-1960 Patient phone:  [phone] (home)  Patient address:   [address] [place] [address],  Total Time spent with patient: 30 minutes  Date of Admission:  07/01/2016 Date of Discharge: 07/02/2016  Reason for Admission:  Worsening depression and moods  Principal Problem: Bipolar affective disorder, current episode depressed [facility]) Discharge Diagnoses: Patient Active Problem List   Diagnosis Date Noted  . Bipolar affective disorder, current episode depressed (HCC) [F31.30] 07/01/2016    Priority: High  . Bipolar disorder (HCC) [F31.9] 01/09/2016  . Insomnia [G47.00] 01/09/2016  . Diverticulitis [K57.92] 01/04/2016  . BRBPR (bright red blood per rectum) [K62.5] 01/04/2016  . ETOH abuse [F10.10] 01/04/2016    Past Psychiatric History: see HPI  Past Medical History:  Past Medical History:  Diagnosis Date  . Back pain, chronic   . Bipolar disorder (HCC)   . Depression   . Diverticulitis   . Hypertension   . Sciatica     Past Surgical History:  Procedure Laterality Date  . BACK SURGERY    . SHOULDER SURGERY     Family History:  Family History  Problem Relation Age of Onset  . Cancer Father    Family Psychiatric  History: see HPI Social History:  History  Alcohol Use  . 3.6 - 7.2 oz/week  . 6 - 12 Cans of beer per week     History  Drug Use  . Types: Marijuana    Comment: not for 28 years    Social History   Social History  . Marital status: Single    Spouse name: N/A  . Number of children: N/A  . Years of education: N/A   Social History Main Topics  . Smoking status: Never Smoker  . Smokeless tobacco: Current User    Types: Chew  . Alcohol use 3.6 - 7.2 oz/week    6 - 12 Cans of beer per week  . Drug use: Yes    Types: Marijuana     Comment: not for 28 years  . Sexual activity: Not Asked   Other Topics Concern  . None    Social History Narrative  . 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  He has a reported history of bipolar disorder as well as alcohol use disorder.  He reported extreme caregiver stress due to being tired of being sole caregiver to his mother.  He stated that he wanted to drive himself into traffic.    Victor Long was admitted for Bipolar affective disorder, current episode depressed (HCC) and crisis management.  Patient was treated with medications with their indications listed below in detail under Medication List.  Medical problems were identified and treated as needed.  Home medications were restarted as appropriate.  Improvement was monitored by observation and [employer] daily report of symptom reduction.  Emotional and mental status was monitored by daily self inventory reports completed by [employer] and clinical staff.  Patient reported continued improvement, denied any new concerns.  Patient had been compliant on medications and denied side effects.  Support and encouragement was provided.    Patient encouraged to attend groups to help with recognizing triggers of emotional crises and de-stabilizations.  Patient encouraged to  attend group to help identify the positive things in life that would help in dealing with feelings of loss, depression and unhealthy or abusive tendencies.         Jefferson FuelJeffery Rembert was evaluated by the treatment team for stability and plans for continued recovery upon discharge.  Patient was offered further treatment options upon discharge including Residential, Intensive Outpatient and Outpatient treatment. Patient will follow up with agency listed below for medication management and counseling.  Encouraged patient to maintain satisfactory support network  and home environment.  Advised to adhere to medication compliance and outpatient treatment follow up.  Prescriptions provided.       Jefferson FuelJeffery Akers motivation was an integral factor for scheduling further treatment.  Employment, transportation, bed availability, health status, family support, and any pending legal issues were also considered during patient's hospital stay.  Upon completion of this admission the patient was both mentally and medically stable for discharge denying suicidal/homicidal ideation, auditory/visual/tactile hallucinations, delusional thoughts and paranoia.      Physical Findings: AIMS: Facial and Oral Movements Muscles of Facial Expression: None, normal Lips and Perioral Area: None, normal Jaw: None, normal Tongue: None, normal,Extremity Movements Upper (arms, wrists, hands, fingers): None, normal Lower (legs, knees, ankles, toes): None, normal, Trunk Movements Neck, shoulders, hips: None, normal, Overall Severity Severity of abnormal movements (highest score from questions above): None, normal Incapacitation due to abnormal movements: None, normal Patient's awareness of abnormal movements (rate only patient's report): No Awareness, Dental Status Current problems with teeth and/or dentures?: No Does patient usually wear dentures?: No  CIWA:  CIWA-Ar Total: 3 COWS:     Musculoskeletal: Strength & Muscle Tone: within normal limits Gait & Station: normal Patient leans: N/A  Psychiatric Specialty Exam: Physical Exam  Nursing note and vitals reviewed.   ROS  Blood pressure 96/77, pulse (!) 103, temperature 98.3 F (36.8 C), temperature source Oral, resp. rate 16, height 5' 4.5" (1.638 m), weight 72.1 kg (159 lb).Body mass index is 26.87 kg/m.    Have you used any form of tobacco in the last 30 days? (Cigarettes, Smokeless Tobacco, Cigars, and/or Pipes): Yes  Has this patient used any form of tobacco in the last 30 days? (Cigarettes, Smokeless Tobacco,  Cigars, and/or Pipes) Yes, N/A  Blood Alcohol level:  Lab Results  Component Value Date   ETH 213 (H) 06/30/2016   ETH 227 (H) 01/03/2016    Metabolic Disorder Labs:  No results found for: HGBA1C, MPG No results found for: PROLACTIN No results found for: CHOL, TRIG, HDL, CHOLHDL, VLDL, LDLCALC  See Psychiatric Specialty Exam and Suicide Risk Assessment completed by Attending Physician prior to discharge.  Discharge destination:  Home  Is patient on multiple antipsychotic therapies at discharge:  No   Has Patient had three or more failed trials of antipsychotic monotherapy by history:  No  Recommended Plan for Multiple Antipsychotic Therapies: NA   Allergies as of 07/02/2016      Reactions   Other Other (See Comments)   Hair dye-chemical burn      Medication List    STOP taking these medications   clindamycin 300 MG capsule Commonly known as:  CLEOCIN   dicyclomine 20 MG tablet Commonly known as:  BENTYL   diphenoxylate-atropine 2.5-0.025 MG tablet Commonly known as:  LOMOTIL   divalproex 500 MG DR tablet Commonly known as:  DEPAKOTE   GOLD BOND EXTRA STRENGTH EX   HYDROcodone-acetaminophen 5-325 MG tablet Commonly known as:  NORCO/VICODIN   loperamide 2 MG capsule  Commonly known as:  IMODIUM   permethrin 5 % cream Commonly known as:  ELIMITE   predniSONE 20 MG tablet Commonly known as:  DELTASONE     TAKE these medications     Indication  hydrOXYzine 25 MG tablet Commonly known as:  ATARAX/VISTARIL Take 1 tablet (25 mg total) by mouth every 6 (six) hours as needed (anxiety/agitation).  Indication:  Anxiety Neurosis   nicotine 21 mg/24hr patch Commonly known as:  NICODERM CQ - dosed in mg/24 hours Place 1 patch (21 mg total) onto the skin daily. Start taking on:  07/03/2016  Indication:  Nicotine Addiction   traZODone 100 MG tablet Commonly known as:  DESYREL Take 1 tablet (100 mg total) by mouth at bedtime and may repeat dose one time if  needed. What changed:  medication strength  how much to take  when to take this  reasons to take this  Indication:  Trouble Sleeping      Follow-up Information    [facility] Follow up.   Specialty:  Behavioral Health Why:  Walk in within 7 days after discharge for hospital follow-up. Walk in hours are: Monday through Friday between 8am-9am. Thank you.  [address] [place] [address] [phone]           Follow-up recommendations:  Activity:  as tol Diet:  as tol  Comments:  1.  Take all your medications as prescribed.   2.  Report any adverse side effects to outpatient provider. 3.  Patient instructed to not use alcohol or illegal drugs while on prescription medicines. 4.  In the event of worsening symptoms, instructed patient to call 911, the crisis hotline or go to nearest emergency room for evaluation of symptoms.  Signed: Lindwood Qua, NP [facility] 07/02/2016, 3:33 PM

## 2016-07-02 NOTE — Progress Notes (Signed)
  [facility] :  Will you be returning to the same living situation after discharge:  Yes,  home with his mother At discharge, do you have transportation home?: Yes,  car in parking lot Do you have the ability to pay for your medications: Yes,  mental health  Release of information consent forms completed and submitted to medical records by CSW.  Patient to Follow up at: Follow-up Information    [facility] Follow up.   Specialty:  Behavioral Health Why:  Walk in within 7 days after discharge for hospital follow-up. Walk in hours are: Monday through Friday between 8am-9am. Thank you.  Contact information: [address] [place] [id number] [phone]           Next level of care provider [facility] Link:no  Safety Planning and Suicide Prevention discussed: Yes,  SPE completed with pt; pt declined to consent to family contact. SPI pamphlet and Mobile Crisis information provided.  Have you used any form of tobacco in the last 30 days? (Cigarettes, Smokeless Tobacco, Cigars, and/or Pipes): Yes  Has patient been referred to the Quitline?: Patient refused referral  Patient has been referred for addiction treatment: Yes  Yorley Buch N Smart LCSW 07/02/2016, 10:20 AM

## 2016-07-02 NOTE — H&P (Signed)
Psychiatric Admission Assessment Adult  Patient Identification: Victor Long MRN:  [id number] Date of Evaluation:  07/02/2016 Chief Complaint:  Bipolar Disorder Principal Diagnosis: alcohol use disorder Diagnosis:   Patient Active Problem List   Diagnosis Date Noted  . Bipolar affective disorder, current episode depressed ([place]) [F31.30] 07/01/2016  . Bipolar disorder ([place]) [F31.9] 01/09/2016  . Insomnia [G47.00] 01/09/2016  . Diverticulitis [K57.92] 01/04/2016  . BRBPR (bright red blood per rectum) [K62.5] 01/04/2016  . ETOH abuse [F10.10] 01/04/2016   History of Present Illness: Patient is seen and examined. Patient is a 56 YO male with a reported history of bipolar disorder and alcohol use disorder. 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 As well he had a recent episode of diverticulitis which caused him distress, which lead to having a worsening mood and depression. He has a reported history of bipolar disorder as well as alcohol use disorder. He stated that the day prior to admission he had 200 OZ of " 8% alcohol".  He reports that he feels like failure because he can't stand taking care of his mother anymore. He reports that he waitson her hand and foot and that she is very dependent. He reports that he told his father before he died in 1998-06-01 that he would take care of his mother but now he just feels like he would rather die. He states just today he wanted to drive his car into traffic so that he would get killed and the only thing that stops him from doing so was his dog was in the car. The patient reports he is an alcoholic. He states he drinks daily but has never gone into withdrawal or had seizures when he stops drinking. Patient reports he has many medical problems. He reports most recently he tried to dye his beard and developed a severe allergic reaction that caused him to have itching and skin discomfort  such that he wanted to die.  He reports he has diverticulitis in his abdomen always hurts also. Associated Signs/Symptoms: Depression Symptoms:  depressed mood, anhedonia, psychomotor agitation, fatigue, feelings of worthlessness/guilt, suicidal thoughts without plan, (Hypo) Manic Symptoms:  Impulsivity, Anxiety Symptoms:  Excessive Worry, Psychotic Symptoms:  none PTSD Symptoms: NA Total Time spent with patient: 45 minutes  Past Psychiatric History: Patient is followed at [facility] for bipolar disorder. He had a been admitted 3 times prior to prison for alcohol related issues. He was in prison x 20 years for a sexual related offense. No other psych admissions.   Is the patient at risk to self? No.  Has the patient been a risk to self in the past 6 months? No.  Has the patient been a risk to self within the distant past? No.  Is the patient a risk to others? No.  Has the patient been a risk to others in the past 6 months? No.  Has the patient been a risk to others within the distant past? No.   Prior Inpatient Therapy:   Prior Outpatient Therapy:    Alcohol Screening: 1. How often do you have a drink containing alcohol?: 4 or more times a week 2. How many drinks containing alcohol do you have on a typical day when you are drinking?: 7, 8, or 9 3. How often do you have six or more drinks on one occasion?: Daily or almost daily Preliminary Score: 7 4. How often during the last year have you found that you  were not able to stop drinking once you had started?: Daily or almost daily 5. How often during the last year have you failed to do what was normally expected from you becasue of drinking?: Daily or almost daily 6. How often during the last year have you needed a first drink in the morning to get yourself going after a heavy drinking session?: Daily or almost daily 7. How often during the last year have you had a feeling of guilt of remorse after drinking?: Daily or almost daily 8.  How often during the last year have you been unable to remember what happened the night before because you had been drinking?: Daily or almost daily 9. Have you or someone else been injured as a result of your drinking?: No 10. Has a relative or friend or a doctor or another health worker been concerned about your drinking or suggested you cut down?: Yes, during the last year Alcohol Use Disorder Identification Test Final Score (AUDIT): 35 Brief Intervention: Yes Substance Abuse History in the last 12 months:  Yes.   Consequences of Substance Abuse: Withdrawal Symptoms:   Cramps Diarrhea Headaches Nausea Tremors Previous Psychotropic Medications: Yes  Psychological Evaluations: Yes  Past Medical History:  Past Medical History:  Diagnosis Date  . Back pain, chronic   . Bipolar disorder ([place])   . Depression   . Diverticulitis   . Hypertension   . Sciatica     Past Surgical History:  Procedure Laterality Date  . BACK SURGERY    . SHOULDER SURGERY     Family History:  Family History  Problem Relation Age of Onset  . Cancer Father    Family Psychiatric  History: aunts and uncles are alcoholics Tobacco Screening: Have you used any form of tobacco in the last 30 days? (Cigarettes, Smokeless Tobacco, Cigars, and/or Pipes): Yes Tobacco use, Select all that apply: smokeless tobacco use daily Are you interested in Tobacco Cessation Medications?: Yes, will notify MD for an order Counseled patient on smoking cessation including recognizing danger situations, developing coping skills and basic information about quitting provided: Yes Social History:  History  Alcohol Use  . 3.6 - 7.2 oz/week  . 6 - 12 Cans of beer per week     History  Drug Use  . Types: Marijuana    Comment: not for 28 years    Additional Social History:                           Allergies:   Allergies  Allergen Reactions  . Other Other (See Comments)    Hair dye-chemical burn   Lab Results:   Results for orders placed or performed during the hospital encounter of 06/30/16 (from the past 48 hour(s))  Comprehensive metabolic panel     Status: Abnormal   Collection Time: 06/30/16  2:25 PM  Result Value Ref Range   Sodium 138 135 - 145 mmol/L   Potassium 3.5 3.5 - 5.1 mmol/L   Chloride 106 101 - 111 mmol/L   CO2 25 22 - 32 mmol/L   Glucose, Bld 116 (H) 65 - 99 mg/dL   BUN 8 6 - 20 mg/dL   Creatinine, Ser 0.85 0.61 - 1.24 mg/dL   Calcium 8.5 (L) 8.9 - 10.3 mg/dL   Total Protein 7.1 6.5 - 8.1 g/dL   Albumin 3.7 3.5 - 5.0 g/dL   AST 82 (H) 15 - 41 U/L   ALT 68 (H) 17 -  63 U/L   Alkaline Phosphatase 87 38 - 126 U/L   Total Bilirubin 0.8 0.3 - 1.2 mg/dL   GFR calc non Af Amer >60 >60 mL/min   GFR calc Af Amer >60 >60 mL/min    Comment: (NOTE) The eGFR has been calculated using the CKD EPI equation. This calculation has not been validated in all clinical situations. eGFR's persistently <60 mL/min signify possible Chronic Kidney Disease.    Anion gap 7 5 - 15  Ethanol     Status: Abnormal   Collection Time: 06/30/16  2:25 PM  Result Value Ref Range   Alcohol, Ethyl (B) 213 (H) <5 mg/dL    Comment:        LOWEST DETECTABLE LIMIT FOR SERUM ALCOHOL IS 5 mg/dL FOR MEDICAL PURPOSES ONLY   Salicylate level     Status: None   Collection Time: 06/30/16  2:25 PM  Result Value Ref Range   Salicylate Lvl <9.3 2.8 - 30.0 mg/dL  Acetaminophen level     Status: Abnormal   Collection Time: 06/30/16  2:25 PM  Result Value Ref Range   Acetaminophen (Tylenol), Serum <10 (L) 10 - 30 ug/mL    Comment:        THERAPEUTIC CONCENTRATIONS VARY SIGNIFICANTLY. A RANGE OF 10-30 ug/mL MAY BE AN EFFECTIVE CONCENTRATION FOR MANY PATIENTS. HOWEVER, SOME ARE BEST TREATED AT CONCENTRATIONS OUTSIDE THIS RANGE. ACETAMINOPHEN CONCENTRATIONS >150 ug/mL AT 4 HOURS AFTER INGESTION AND >50 ug/mL AT 12 HOURS AFTER INGESTION ARE OFTEN ASSOCIATED WITH TOXIC REACTIONS.   cbc     Status: None    Collection Time: 06/30/16  2:25 PM  Result Value Ref Range   WBC 6.9 4.0 - 10.5 K/uL   RBC 5.53 4.22 - 5.81 MIL/uL   Hemoglobin 16.8 13.0 - 17.0 g/dL   HCT 49.3 39.0 - 52.0 %   MCV 89.2 78.0 - 100.0 fL   MCH 30.4 26.0 - 34.0 pg   MCHC 34.1 30.0 - 36.0 g/dL   RDW 13.4 11.5 - 15.5 %   Platelets 285 150 - 400 K/uL  Valproic acid level     Status: Abnormal   Collection Time: 06/30/16  2:25 PM  Result Value Ref Range   Valproic Acid Lvl <10 (L) 50.0 - 100.0 ug/mL    Comment: RESULTS CONFIRMED BY MANUAL DILUTION  Rapid urine drug screen (hospital performed)     Status: None   Collection Time: 06/30/16  3:22 PM  Result Value Ref Range   Opiates NONE DETECTED NONE DETECTED   Cocaine NONE DETECTED NONE DETECTED   Benzodiazepines NONE DETECTED NONE DETECTED   Amphetamines NONE DETECTED NONE DETECTED   Tetrahydrocannabinol NONE DETECTED NONE DETECTED   Barbiturates NONE DETECTED NONE DETECTED    Comment:        DRUG SCREEN FOR MEDICAL PURPOSES ONLY.  IF CONFIRMATION IS NEEDED FOR ANY PURPOSE, NOTIFY LAB WITHIN 5 DAYS.        LOWEST DETECTABLE LIMITS FOR URINE DRUG SCREEN Drug Class       Cutoff (ng/mL) Amphetamine      1000 Barbiturate      200 Benzodiazepine   734 Tricyclics       287 Opiates          300 Cocaine          300 THC              50     Blood Alcohol level:  Lab Results  Component Value Date  ETH 213 (H) 06/30/2016   ETH 227 (H) 22/29/7989    Metabolic Disorder Labs:  No results found for: HGBA1C, MPG No results found for: PROLACTIN No results found for: CHOL, TRIG, HDL, CHOLHDL, VLDL, LDLCALC  Current Medications: Current Facility-Administered Medications  Medication Dose Route Frequency Provider Last Rate Last Dose  . acetaminophen (TYLENOL) tablet 650 mg  650 mg Oral Q6H PRN Kerrie Buffalo, NP      . alum & mag hydroxide-simeth (MAALOX/MYLANTA) 200-200-20 MG/5ML suspension 30 mL  30 mL Oral Q4H PRN Kerrie Buffalo, NP      . diphenhydrAMINE-zinc  acetate (BENADRYL) 2-0.1 % cream   Topical TID PRN Kerrie Buffalo, NP      . hydrOXYzine (ATARAX/VISTARIL) tablet 25 mg  25 mg Oral Q6H PRN Kerrie Buffalo, NP      . loperamide (IMODIUM) capsule 2-4 mg  2-4 mg Oral PRN Kerrie Buffalo, NP      . LORazepam (ATIVAN) tablet 1 mg  1 mg Oral Q6H PRN Kerrie Buffalo, NP      . LORazepam (ATIVAN) tablet 1 mg  1 mg Oral TID Kerrie Buffalo, NP   1 mg at 07/02/16 2119   Followed by  . [START ON 07/03/2016] LORazepam (ATIVAN) tablet 1 mg  1 mg Oral BID Kerrie Buffalo, NP       Followed by  . [START ON 07/04/2016] LORazepam (ATIVAN) tablet 1 mg  1 mg Oral Daily Kerrie Buffalo, NP      . magnesium hydroxide (MILK OF MAGNESIA) suspension 30 mL  30 mL Oral Daily PRN Kerrie Buffalo, NP      . multivitamin with minerals tablet 1 tablet  1 tablet Oral Daily Kerrie Buffalo, NP   1 tablet at 07/02/16 0823  . nicotine (NICODERM CQ - dosed in mg/24 hours) patch 21 mg  21 mg Transdermal Daily Sharma Covert, MD   21 mg at 07/02/16 0824  . ondansetron (ZOFRAN-ODT) disintegrating tablet 4 mg  4 mg Oral Q6H PRN Kerrie Buffalo, NP      . thiamine (VITAMIN B-1) tablet 100 mg  100 mg Oral Daily Kerrie Buffalo, NP   100 mg at 07/02/16 4174  . traZODone (DESYREL) tablet 100 mg  100 mg Oral QHS,MR X 1 Laverle Hobby, PA-C   100 mg at 07/01/16 2252   PTA Medications: Prescriptions Prior to Admission  Medication Sig Dispense Refill Last Dose  . clindamycin (CLEOCIN) 300 MG capsule Take 1 capsule (300 mg total) by mouth every 6 (six) hours. (Patient not taking: Reported on 06/09/2016) 28 capsule 0 Completed Course at Unknown time  . dicyclomine (BENTYL) 20 MG tablet Take 1 tablet (20 mg total) by mouth 4 (four) times daily -  before meals and at bedtime. (Patient not taking: Reported on 06/30/2016) 40 tablet 0 Completed Course at Unknown time  . diphenoxylate-atropine (LOMOTIL) 2.5-0.025 MG tablet Take 1 tablet by mouth 4 (four) times daily as needed for diarrhea or loose stools.  30 tablet 0 06/29/2016 at Unknown time  . divalproex (DEPAKOTE) 500 MG DR tablet Take 1,500 mg by mouth daily.    06/29/2016 at 1300  . HYDROcodone-acetaminophen (NORCO/VICODIN) 5-325 MG tablet Take 1 tablet by mouth every 6 (six) hours as needed for severe pain. (Patient not taking: Reported on 06/30/2016) 10 tablet 0 Completed Course at Unknown time  . loperamide (IMODIUM) 2 MG capsule Take 1 capsule (2 mg total) by mouth 4 (four) times daily as needed for diarrhea or loose stools. 12 capsule 0  Past Week at Unknown time  . Menthol-Zinc Oxide (GOLD BOND EXTRA STRENGTH EX) Apply 1 application topically 5 (five) times daily.   06/30/2016 at Unknown time  . permethrin (ELIMITE) 5 % cream Apply cream from head to feet, leave on for 8 to 14 hours, then wash with soap/water, repeat application if living mites present 14 days after initial treatment (FDA dosage (Patient not taking: Reported on 06/30/2016) 60 g 0 Completed Course at Unknown time  . predniSONE (DELTASONE) 20 MG tablet Take 2 tablets (40 mg total) by mouth daily. (Patient not taking: Reported on 06/30/2016) 10 tablet 0 Not Taking at Unknown time  . traZODone (DESYREL) 50 MG tablet Take 50 mg by mouth at bedtime as needed for sleep.   Past Week at Unknown time    Musculoskeletal: Strength & Muscle Tone: within normal limits Gait & Station: normal Patient leans: N/A  Psychiatric Specialty Exam: Physical Exam  Nursing note and vitals reviewed.   ROS  Blood pressure 96/77, pulse (!) 103, temperature 98.3 F (36.8 C), temperature source Oral, resp. rate 16, height 5' 4.5" (1.638 m), weight 72.1 kg (159 lb).Body mass index is 26.87 kg/m.  General Appearance: Disheveled  Eye Contact:  Fair  Speech:  Clear and Coherent  Volume:  Normal  Mood:  Anxious  Affect:  Congruent  Thought Process:  Coherent  Orientation:  Full (Time, Place, and Person)  Thought Content:  Logical  Suicidal Thoughts:  No  Homicidal Thoughts:  No  Memory:  Immediate;    Fair  Judgement:  Impaired  Insight:  Lacking  Psychomotor Activity:  Increased  Concentration:  Concentration: Fair  Recall:  [employer] of Knowledge:  Fair  Language:  Fair  Akathisia:  No  Handed:  Right  AIMS (if indicated):     Assets:  Housing Social Support  ADL's:  Intact  Cognition:  WNL  Sleep:  Number of Hours: 6    Treatment Plan Summary: Daily contact with patient to assess and evaluate symptoms and progress in treatment, Medication management and Plan Patient requested admission, but has decided that he does not like the idea that he cannot use tobacco or listen to "my music". I have explained the danger of ETOH withdrawal as well as the problems mixing alcohol with depakote ( which his labs have shown his LFT's are elevated). We will try and convince him to stay but will discharge AMA if he persists.  Observation Level/Precautions:  Detox 15 minute checks  Laboratory:  CBC Chemistry Profile Folic Acid UDS UA  Psychotherapy:    Medications:    Consultations:    Discharge Concerns:    Estimated LOS:  Other:     Physician Treatment Plan for Primary Diagnosis: <principal problem not specified> Long Term Goal(s): Improvement in symptoms so as ready for discharge  Short Term Goals: Ability to identify changes in lifestyle to reduce recurrence of condition will improve, Ability to verbalize feelings will improve, Ability to disclose and discuss suicidal ideas, Ability to demonstrate self-control will improve, Ability to identify and develop effective coping behaviors will improve, Ability to maintain clinical measurements within normal limits will improve, Compliance with prescribed medications will improve and Ability to identify triggers associated with substance abuse/mental health issues will improve  Physician Treatment Plan for Secondary Diagnosis: Active Problems:   Bipolar affective disorder, current episode depressed ([place])  Long Term Goal(s): Improvement  in symptoms so as ready for discharge  Short Term Goals: Ability to identify changes in lifestyle  to reduce recurrence of condition will improve, Ability to verbalize feelings will improve, Ability to disclose and discuss suicidal ideas, Ability to demonstrate self-control will improve, Ability to identify and develop effective coping behaviors will improve, Ability to maintain clinical measurements within normal limits will improve, Compliance with prescribed medications will improve and Ability to identify triggers associated with substance abuse/mental health issues will improve  I certify that inpatient services furnished can reasonably be expected to improve the patient's condition.    Sharma Covert, MD 3/22/201810:47 AM

## 2016-07-02 NOTE — Tx Team (Signed)
Interdisciplinary Treatment and Diagnostic Plan Update  07/02/2016 Time of Session: 0930 Victor Long MRN: [id number]  Principal Diagnosis: Alcohol Use Disorder  Secondary Diagnoses: Active Problems:   Bipolar affective disorder, current episode depressed (HCC)   Current Medications:  Current Facility-Administered Medications  Medication Dose Route Frequency Provider Last Rate Last Dose  . acetaminophen (TYLENOL) tablet 650 mg  650 mg Oral Q6H PRN Kerrie Buffalo, NP      . alum & mag hydroxide-simeth (MAALOX/MYLANTA) 200-200-20 MG/5ML suspension 30 mL  30 mL Oral Q4H PRN Kerrie Buffalo, NP      . diphenhydrAMINE-zinc acetate (BENADRYL) 2-0.1 % cream   Topical TID PRN Kerrie Buffalo, NP      . hydrOXYzine (ATARAX/VISTARIL) tablet 25 mg  25 mg Oral Q6H PRN Kerrie Buffalo, NP      . loperamide (IMODIUM) capsule 2-4 mg  2-4 mg Oral PRN Kerrie Buffalo, NP      . LORazepam (ATIVAN) tablet 1 mg  1 mg Oral Q6H PRN Kerrie Buffalo, NP      . LORazepam (ATIVAN) tablet 1 mg  1 mg Oral TID Kerrie Buffalo, NP   1 mg at 07/02/16 8850   Followed by  . [START ON 07/03/2016] LORazepam (ATIVAN) tablet 1 mg  1 mg Oral BID Kerrie Buffalo, NP       Followed by  . [START ON 07/04/2016] LORazepam (ATIVAN) tablet 1 mg  1 mg Oral Daily Kerrie Buffalo, NP      . magnesium hydroxide (MILK OF MAGNESIA) suspension 30 mL  30 mL Oral Daily PRN Kerrie Buffalo, NP      . multivitamin with minerals tablet 1 tablet  1 tablet Oral Daily Kerrie Buffalo, NP   1 tablet at 07/02/16 0823  . nicotine (NICODERM CQ - dosed in mg/24 hours) patch 21 mg  21 mg Transdermal Daily Sharma Covert, MD   21 mg at 07/02/16 0824  . ondansetron (ZOFRAN-ODT) disintegrating tablet 4 mg  4 mg Oral Q6H PRN Kerrie Buffalo, NP      . thiamine (VITAMIN B-1) tablet 100 mg  100 mg Oral Daily Kerrie Buffalo, NP   100 mg at 07/02/16 2774  . traZODone (DESYREL) tablet 100 mg  100 mg Oral QHS,MR X 1 Laverle Hobby, PA-C   100 mg at 07/01/16 2252    PTA Medications: Prescriptions Prior to Admission  Medication Sig Dispense Refill Last Dose  . clindamycin (CLEOCIN) 300 MG capsule Take 1 capsule (300 mg total) by mouth every 6 (six) hours. (Patient not taking: Reported on 06/09/2016) 28 capsule 0 Completed Course at Unknown time  . dicyclomine (BENTYL) 20 MG tablet Take 1 tablet (20 mg total) by mouth 4 (four) times daily -  before meals and at bedtime. (Patient not taking: Reported on 06/30/2016) 40 tablet 0 Completed Course at Unknown time  . diphenoxylate-atropine (LOMOTIL) 2.5-0.025 MG tablet Take 1 tablet by mouth 4 (four) times daily as needed for diarrhea or loose stools. 30 tablet 0 06/29/2016 at Unknown time  . divalproex (DEPAKOTE) 500 MG DR tablet Take 1,500 mg by mouth daily.    06/29/2016 at 1300  . HYDROcodone-acetaminophen (NORCO/VICODIN) 5-325 MG tablet Take 1 tablet by mouth every 6 (six) hours as needed for severe pain. (Patient not taking: Reported on 06/30/2016) 10 tablet 0 Completed Course at Unknown time  . loperamide (IMODIUM) 2 MG capsule Take 1 capsule (2 mg total) by mouth 4 (four) times daily as needed for diarrhea or loose stools. 12 capsule 0 Past  Week at Unknown time  . Menthol-Zinc Oxide (GOLD BOND EXTRA STRENGTH EX) Apply 1 application topically 5 (five) times daily.   06/30/2016 at Unknown time  . permethrin (ELIMITE) 5 % cream Apply cream from head to feet, leave on for 8 to 14 hours, then wash with soap/water, repeat application if living mites present 14 days after initial treatment (FDA dosage (Patient not taking: Reported on 06/30/2016) 60 g 0 Completed Course at Unknown time  . predniSONE (DELTASONE) 20 MG tablet Take 2 tablets (40 mg total) by mouth daily. (Patient not taking: Reported on 06/30/2016) 10 tablet 0 Not Taking at Unknown time  . traZODone (DESYREL) 50 MG tablet Take 50 mg by mouth at bedtime as needed for sleep.   Past Week at Unknown time    Patient Stressors: Marital or family conflict Substance  abuse  Patient Strengths: Average or above average intelligence Communication skills Motivation for treatment/growth  Treatment Modalities: Medication Management, Group therapy, Case management,  1 to 1 session with clinician, Psychoeducation, Recreational therapy.   Physician Treatment Plan for Primary Diagnosis:Alcohol Use Disorder  Long Term Goal(s):     Short Term Goals:    Medication Management: Evaluate patient's response, side effects, and tolerance of medication regimen.  Therapeutic Interventions: 1 to 1 sessions, Unit Group sessions and Medication administration.  Evaluation of Outcomes: Met  Physician Treatment Plan for Secondary Diagnosis: Active Problems:   Bipolar affective disorder, current episode depressed ([place])  Long Term Goal(s):     Short Term Goals:       Medication Management: Evaluate patient's response, side effects, and tolerance of medication regimen.  Therapeutic Interventions: 1 to 1 sessions, Unit Group sessions and Medication administration.  Evaluation of Outcomes: Met   RN Treatment Plan for Primary Diagnosis: Alcohol Use Disorder Long Term Goal(s): Knowledge of disease and therapeutic regimen to maintain health will improve  Short Term Goals: Ability to remain free from injury will improve, Ability to identify and develop effective coping behaviors will improve and Compliance with prescribed medications will improve  Medication Management: RN will administer medications as ordered by provider, will assess and evaluate patient's response and provide education to patient for prescribed medication. RN will report any adverse and/or side effects to prescribing provider.  Therapeutic Interventions: 1 on 1 counseling sessions, Psychoeducation, Medication administration, Evaluate responses to treatment, Monitor vital signs and CBGs as ordered, Perform/monitor CIWA, COWS, AIMS and Fall Risk screenings as ordered, Perform wound care treatments as  ordered.  Evaluation of Outcomes: Adequate for Discharge   LCSW Treatment Plan for Primary Diagnosis: Alcohol Use Disorder Long Term Goal(s): Safe transition to appropriate next level of care at discharge, Engage patient in therapeutic group addressing interpersonal concerns.  Short Term Goals: Engage patient in aftercare planning with referrals and resources, Facilitate patient progression through stages of change regarding substance use diagnoses and concerns and Identify triggers associated with mental health/substance abuse issues  Therapeutic Interventions: Assess for all discharge needs, 1 to 1 time with Social worker, Explore available resources and support systems, Assess for adequacy in community support network, Educate family and significant other(s) on suicide prevention, Complete Psychosocial Assessment, Interpersonal group therapy.  Evaluation of Outcomes: Adequate for Discharge   Progress in Treatment: Attending groups: No. Participating in groups: No. Taking medication as prescribed: Yes. Toleration medication: Yes. Family/Significant other contact made: SPE completed with pt; pt declined to consent to family contact.  Patient understands diagnosis: Yes. Discussing patient identified problems/goals with staff: Yes. Medical problems stabilized or resolved:  Yes. Denies suicidal/homicidal ideation: Yes. Issues/concerns per patient self-inventory: No. Other: n/a   New problem(s) identified: No, Describe:  n/a  New Short Term/Long Term Goal(s): medication stabilization; development of comprehensive mental wellness/sobriety plan.   Discharge Plan or Barriers: Pt agreeable to [facility] referral and nothing more. Not open to AA/NA or [facility] [employer] [facility]. Car in parking lot.   Reason for Continuation of Hospitalization: discharge today/none  Estimated Length of Stay: discharge today   Attendees: Patient: 07/02/2016 10:22 AM  Physician: Dr. Mallie Darting MD 07/02/2016 10:22 AM   Nursing: Farrel Gobble RN 07/02/2016 10:22 AM  RN Care Manager: Lars Pinks CM 07/02/2016 10:22 AM  Social Worker: Press photographer, LCSW; Matthew Saras [place] 07/02/2016 10:22 AM  Recreational Therapist: x 07/02/2016 10:22 AM  Other: May Augustin NP 07/02/2016 10:22 AM  Other:  07/02/2016 10:22 AM  Other: 07/02/2016 10:22 AM    Scribe for Treatment Team: [place], LCSW 07/02/2016 10:22 AM

## 2016-07-02 NOTE — [facility] Suicide Risk Assessment (Signed)
[facility]   Principal Problem: alcohol use disorder Discharge Diagnoses:  Patient Active Problem List   Diagnosis Date Noted  . Bipolar affective disorder, current episode depressed (HCC) [F31.30] 07/01/2016  . Bipolar disorder (HCC) [F31.9] 01/09/2016  . Insomnia [G47.00] 01/09/2016  . Diverticulitis [K57.92] 01/04/2016  . BRBPR (bright red blood per rectum) [K62.5] 01/04/2016  . ETOH abuse [F10.10] 01/04/2016    Total Time spent with patient: 45 minutes  Musculoskeletal: Strength & Muscle Tone: within normal limits Gait & Station: normal Patient leans: N/A  Psychiatric Specialty Exam: ROS  Blood pressure 96/77, pulse (!) 103, temperature 98.3 F (36.8 C), temperature source Oral, resp. rate 16, height 5' 4.5" (1.638 m), weight 72.1 kg (159 lb).Body mass index is 26.87 kg/m.  General Appearance: Disheveled  Eye SolicitorContact::  Fair  Speech:  Normal Rate409  Volume:  Normal  Mood:  Anxious  Affect:  Appropriate  Thought Process:  Coherent  Orientation:  Full (Time, Place, and Person)  Thought Content:  Logical  Suicidal Thoughts:  No  Homicidal Thoughts:  No  Memory:  Immediate;   Fair  Judgement:  Impaired  Insight:  Lacking  Psychomotor Activity:  Increased  Concentration:  Fair  Recall:  Good  Fund of Knowledge:Fair  Language: Fair  Akathisia:  No  Handed:  Right  AIMS (if indicated):     Assets:  Housing  Sleep:  Number of Hours: 6  Cognition: WNL  ADL's:  Intact   Mental Status Per Nursing Assessment::   On Admission:     Demographic Factors:  Male, Caucasian, Low socioeconomic status and Unemployed  Loss Factors: Financial problems/change in socioeconomic status  Historical Factors: Impulsivity  Risk Reduction Factors:   Sense of responsibility to family  Continued Clinical Symptoms:  Bipolar Disorder:   Depressive phase Alcohol/Substance Abuse/Dependencies  Cognitive Features That Contribute To Risk:   Closed-mindedness and Thought constriction (tunnel vision)    Suicide Risk:  Minimal: No identifiable suicidal ideation.  Patients presenting with no risk factors but with morbid ruminations; may be classified as minimal risk based on the severity of the depressive symptoms  [facility]-up Information    [facility]   Specialty:  Behavioral Health Why:  Walk in within 7 days after discharge for hospital follow-up. Walk in hours are: Monday through Friday between 8am-9am. Thank you.  Contact information: [address] [place] [address] [phone]           Plan Of Care/Follow-up recommendations:  Activity:  ad lib  Antonieta PertGreg Lawson Clary, MD 07/02/2016, 10:54 AM

## 2017-02-01 ENCOUNTER — Encounter (HOSPITAL_COMMUNITY): Payer: Self-pay | Admitting: Emergency Medicine

## 2017-02-01 ENCOUNTER — Emergency Department (HOSPITAL_COMMUNITY)
Admission: EM | Admit: 2017-02-01 | Discharge: 2017-02-01 | Disposition: A | Discharge status: Home / Self Care | Payer: PRIVATE HEALTH INSURANCE | Attending: Emergency Medicine | Admitting: Emergency Medicine

## 2017-02-01 DIAGNOSIS — M25512 Pain in left shoulder: Secondary | ICD-10-CM | POA: Insufficient documentation

## 2017-02-01 DIAGNOSIS — F17220 Nicotine dependence, chewing tobacco, uncomplicated: Secondary | ICD-10-CM | POA: Insufficient documentation

## 2017-02-01 DIAGNOSIS — I10 Essential (primary) hypertension: Secondary | ICD-10-CM | POA: Insufficient documentation

## 2017-02-01 DIAGNOSIS — Z79899 Other long term (current) drug therapy: Secondary | ICD-10-CM | POA: Insufficient documentation

## 2017-02-01 DIAGNOSIS — M79602 Pain in left arm: Secondary | ICD-10-CM | POA: Insufficient documentation

## 2017-02-01 DIAGNOSIS — M542 Cervicalgia: Secondary | ICD-10-CM | POA: Insufficient documentation

## 2017-02-01 MED ORDER — PREDNISONE 20 MG PO TABS: 20.0000 mg | ORAL_TABLET | Freq: Two times a day (BID) | ORAL | 0 refills | Status: DC

## 2017-02-01 NOTE — Discharge Instructions (Signed)
Your neck pain is most likely related to arthritis causing either irritation of nerve or muscle.  Also you appear to have a left acromioclavicular inflammatory process.  This is another form of arthritis.  Treatments for all of these conditions include anti-inflammatory medications, heat and time.  As you begin to improve with this treatment, start to gradually stretch your neck and left shoulder and improved her mobility with gentle range of motion exercises.  It is important to rest and avoid doing things which hurt, while you are having pain.  After you finish prednisone, you can use ibuprofen for anti-inflammatory effect.  The proper dose of ibuprofen is 400 mg 3 times a day.  In addition you can use Tylenol 650 mg every 4 hours as needed for pain.  It is important to follow-up with her primary care doctor in 1 or 2 weeks to make sure you are getting better.  We are supplying the resource guide to help you find a doctor.

## 2017-02-01 NOTE — ED Provider Notes (Signed)
[facility] EMERGENCY DEPARTMENT Provider Note   CSN: [id number] Arrival date & time: 02/01/17  1213     History   Chief Complaint Chief Complaint  Patient presents with  . Arm Pain    HPI Victor Long is a 56 y.o. male.  He presents for evaluation of pain neck, left shoulder and left arm.  Pain started insidiously several days ago and is worsened to the point he is unable to lift his left arm.  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  He has tried ibuprofen without relief.  He is also noticed a bump on his left shoulder which is tender in the last couple of weeks.  He previously had his right AC joint, operated on for a traumatic injury, remotely.  He denies fever, cough, chest pain, weakness, dizziness, suicidal ideation or suicidal plan.  There are no other known modifying factors.  HPI  Past Medical History:  Diagnosis Date  . Back pain, chronic   . Bipolar disorder (HCC)   . Depression   . Diverticulitis   . Hypertension   . Sciatica     Patient Active Problem List   Diagnosis Date Noted  . Bipolar affective disorder, current episode depressed (HCC) 07/01/2016  . Bipolar disorder (HCC) 01/09/2016  . Insomnia 01/09/2016  . Diverticulitis 01/04/2016  . BRBPR (bright red blood per rectum) 01/04/2016  . ETOH abuse 01/04/2016    Past Surgical History:  Procedure Laterality Date  . BACK SURGERY    . SHOULDER SURGERY         Home Medications    Prior to Admission medications   Medication Sig Start Date End Date Taking? Authorizing Provider  hydrOXYzine (ATARAX/VISTARIL) 25 MG tablet Take 1 tablet (25 mg total) by mouth every 6 (six) hours as needed (anxiety/agitation). 07/02/16   Adonis Brook, NP  nicotine (NICODERM CQ - DOSED IN MG/24 HOURS) 21 mg/24hr patch Place 1 patch (21 mg total) onto the skin daily. 07/03/16   Adonis Brook, NP  predniSONE (DELTASONE) 20 MG tablet Take 1 tablet  (20 mg total) by mouth 2 (two) times daily. 02/01/17   Mancel Bale, MD  traZODone (DESYREL) 100 MG tablet Take 1 tablet (100 mg total) by mouth at bedtime and may repeat dose one time if needed. 07/02/16   Adonis Brook, NP    Family History Family History  Problem Relation Age of Onset  . Cancer Father     Social History Social History  Substance Use Topics  . Smoking status: Never Smoker  . Smokeless tobacco: Current User    Types: Chew  . Alcohol use 3.6 - 7.2 oz/week    6 - 12 Cans of beer per week     Allergies   Other   Review of Systems Review of Systems  All other systems reviewed and are negative.    Physical Exam Updated Vital Signs BP (!) 145/93 (BP Location: Right Arm)   Pulse 95   Temp 98.3 F (36.8 C) (Oral)   Resp 16   SpO2 97%   Physical Exam  Constitutional: He is oriented to person, place, and time. He appears well-developed and well-nourished. No distress.  HENT:  Head: Normocephalic and atraumatic.  Right Ear: External ear normal.  Left Ear: External ear normal.  Eyes: Pupils are equal, round, and reactive to light. Conjunctivae and EOM are normal.  Neck: Normal range of motion and phonation normal. Neck supple.  Cardiovascular: Normal rate.   Pulmonary/Chest: Effort normal. He exhibits no bony tenderness.  Musculoskeletal:  Tender left AC joint with slight swelling.  No reported prior injury to the area.  Neck diffusely tender, over the posterior cervical spines, without step-off or deformity. Decreased movement left shoulder secondary to pain.  Neurological: He is alert and oriented to person, place, and time. No cranial nerve deficit or sensory deficit. He exhibits normal muscle tone. Coordination normal.  Skin: Skin is warm, dry and intact.  Psychiatric: He has a normal mood and affect. His behavior is normal. Judgment and thought content normal.  Nursing note and vitals reviewed.    ED Treatments / Results  Labs (all labs  ordered are listed, but only abnormal results are displayed) Labs Reviewed - No data to display  EKG  EKG Interpretation None       Radiology No results found.  Procedures Procedures (including critical care time)  Medications Ordered in ED Medications - No data to display   Initial Impression / Assessment and Plan / ED Course  I have reviewed the triage vital signs and the nursing notes.  Pertinent labs & imaging results that were available during my care of the patient were reviewed by me and considered in my medical decision making (see chart for details).      Patient Vitals for the past 24 hrs:  BP Temp Temp src Pulse Resp SpO2  02/01/17 1336 130/84 - - 94 16 98 %  02/01/17 1223 (!) 145/93 98.3 F (36.8 C) Oral 95 16 97 %    1:37 PM Reevaluation with update and discussion. After initial assessment and treatment, an updated evaluation reveals no change in clinical status.  Findings discussed with patient and all questions were answered. Viann Nielson L      Final Clinical Impressions(s) / ED Diagnoses   Final diagnoses:  Pain, neck  Left arm pain  Arthralgia of left acromioclavicular joint   Nonspecific neck left shoulder and left arm pain.  Evaluation is consistent with cervical radiculopathy, and left AC inflammatory process.  Doubt cervical myelopathy, fracture or unstable psychiatric condition.   Nursing Notes Reviewed/ Care Coordinated Applicable Imaging Reviewed Interpretation of Laboratory Data incorporated into ED treatment  The patient appears reasonably screened and/or stabilized for discharge and I doubt any other medical condition or other [facility] requiring further screening, evaluation, or treatment in the ED at this time prior to discharge.  Plan: Home Medications-OTC analgesia as needed; Home Treatments-cryotherapy and heat therapy gradual range of motion exercises; return here if the recommended treatment, does not improve the symptoms;  Recommended follow up-PCP, as needed   New Prescriptions New Prescriptions   PREDNISONE (DELTASONE) 20 MG TABLET    Take 1 tablet (20 mg total) by mouth 2 (two) times daily.     Mancel BaleWentz, Revis Whalin, MD 02/01/17 1340

## 2017-02-01 NOTE — ED Triage Notes (Addendum)
States for the past few months he has pain in back of his neck that radiates down left arm , struggling to lift it  X 2 weeks , has been medicating with etoh he states states has tried numerous meds  vicodin to others and nothing helps , has a knot on shoulder  He states   States did feel like hurting himself last night but his mother talked him down ,

## 2017-02-03 ENCOUNTER — Encounter (HOSPITAL_COMMUNITY): Payer: Self-pay | Admitting: Emergency Medicine

## 2017-02-03 ENCOUNTER — Emergency Department (HOSPITAL_COMMUNITY): Payer: Self-pay

## 2017-02-03 ENCOUNTER — Emergency Department (HOSPITAL_COMMUNITY)
Admission: EM | Admit: 2017-02-03 | Discharge: 2017-02-03 | Disposition: A | Discharge status: Home / Self Care | Payer: Self-pay | Attending: Emergency Medicine | Admitting: Emergency Medicine

## 2017-02-03 DIAGNOSIS — F17290 Nicotine dependence, other tobacco product, uncomplicated: Secondary | ICD-10-CM | POA: Insufficient documentation

## 2017-02-03 DIAGNOSIS — I10 Essential (primary) hypertension: Secondary | ICD-10-CM | POA: Insufficient documentation

## 2017-02-03 DIAGNOSIS — M25512 Pain in left shoulder: Secondary | ICD-10-CM | POA: Insufficient documentation

## 2017-02-03 NOTE — ED Provider Notes (Signed)
[facility]-EMERGENCY DEPT Provider Note   CSN: [id number] Arrival date & time: 02/03/17  7846     History   Chief Complaint Chief Complaint  Patient presents with  . Arm Pain    HPI Rain Friedt is a 56 y.o. male who  has a past medical history of Alcohol abuse,Back pain, chronic; Bipolar disorder (HCC); Depression; Diverticulitis; Hypertension; and Sciatica. .  He states that time the patient states that this about 2 months ago he started having some difficulty using the left arm. He denies known injury or nerve palsy after alcohol use. The patient  Complains of pain with any movement of the shoulder and has extremely limited range of motion.  He also complains of pain in the left trapezius region.  He states at times but the whole arm goes numb.  He denies previous neck injury.  HPI  Past Medical History:  Diagnosis Date  . Back pain, chronic   . Bipolar disorder (HCC)   . Depression   . Diverticulitis   . Hypertension   . Sciatica     Patient Active Problem List   Diagnosis Date Noted  . Bipolar affective disorder, current episode depressed (HCC) 07/01/2016  . Bipolar disorder (HCC) 01/09/2016  . Insomnia 01/09/2016  . Diverticulitis 01/04/2016  . BRBPR (bright red blood per rectum) 01/04/2016  . ETOH abuse 01/04/2016    Past Surgical History:  Procedure Laterality Date  . BACK SURGERY    . SHOULDER SURGERY         Home Medications    Prior to Admission medications   Medication Sig Start Date End Date Taking? Authorizing Provider  hydrOXYzine (ATARAX/VISTARIL) 25 MG tablet Take 1 tablet (25 mg total) by mouth every 6 (six) hours as needed (anxiety/agitation). 07/02/16   Adonis Brook, NP  nicotine (NICODERM CQ - DOSED IN MG/24 HOURS) 21 mg/24hr patch Place 1 patch (21 mg total) onto the skin daily. 07/03/16   Adonis Brook, NP  predniSONE (DELTASONE) 20 MG tablet Take 1 tablet (20 mg total) by mouth 2 (two) times daily. 02/01/17    Mancel Bale, MD  traZODone (DESYREL) 100 MG tablet Take 1 tablet (100 mg total) by mouth at bedtime and may repeat dose one time if needed. 07/02/16   Adonis Brook, NP    Family History Family History  Problem Relation Age of Onset  . Cancer Father     Social History Social History  Substance Use Topics  . Smoking status: Never Smoker  . Smokeless tobacco: Current User    Types: Chew  . Alcohol use 3.6 - 7.2 oz/week    6 - 12 Cans of beer per week     Allergies   Other   Review of Systems Review of Systems  Ten systems reviewed and are negative for acute change, except as noted in the HPI.   Physical Exam Updated Vital Signs BP (!) 142/96 (BP Location: Right Arm)   Pulse 87   Temp 97.9 F (36.6 C) (Oral)   Resp 18   Ht 5\' 4"  (1.626 m)   Wt 68.9 kg (152 lb)   SpO2 97%   BMI 26.09 kg/m   Physical Exam  Constitutional: He appears well-developed and well-nourished. No distress.  HENT:  Head: Normocephalic and atraumatic.  Eyes: Conjunctivae are normal. No scleral icterus.  Neck: Normal range of motion. Neck supple.  Cardiovascular: Normal rate, regular rhythm and normal heart sounds.   Pulmonary/Chest: Effort normal and breath sounds normal. No  respiratory distress.  Abdominal: Soft. There is no tenderness.  Musculoskeletal: He exhibits no edema.  Left shoulder with sig atrophy of the deltoid muscle. Pain with active and passive ROM. Patient only has about 10 degrees of active ROM in all directions and appears to compensate with the trapezius. Normal strength with flexion extension radial and ulnar deviation at the wrist, normal strength in the bicep.  Strength is limited either due to pain or weakness with triceps extension.  Neurological: He is alert.  Skin: Skin is warm and dry. He is not diaphoretic.  Psychiatric: His behavior is normal.  Nursing note and vitals reviewed.    ED Treatments / Results  Labs (all labs ordered are listed, but only  abnormal results are displayed) Labs Reviewed - No data to display  EKG  EKG Interpretation None       Radiology Dg Shoulder Left  Result Date: 02/03/2017 CLINICAL DATA:  Left shoulder pain radiating down the arm, worse for 2 months. EXAM: LEFT SHOULDER - 2+ VIEW COMPARISON:  None. FINDINGS: Small acromion spur. Mild spurring at the acromioclavicular joint. No glenohumeral joint narrowing or spurring. No fracture deformity or soft tissue calcification. Borderline high humeral head. Remote lateral left rib fractures. IMPRESSION: No acute finding. Mild acromion and AC joint spurring. Electronically Signed   By: Marnee SpringJonathon  Watts M.D.   On: 02/03/2017 10:34    Procedures Procedures (including critical care time)  Medications Ordered in ED Medications - No data to display   Initial Impression / Assessment and Plan / ED Course  I have reviewed the triage vital signs and the nursing notes.  Pertinent labs & imaging results that were available during my care of the patient were reviewed by me and considered in my medical decision making (see chart for details).  Clinical Course as of Feb 03 1049  Wed Feb 03, 2017  1048 I have given patient in sign out to Dr. Patria Maneampos   [AH]    Clinical Course User Index [AH] Arthor CaptainHarris, Inetta Dicke, PA-C      Final Clinical Impressions(s) / ED Diagnoses   Final diagnoses:  None    New Prescriptions New Prescriptions   No medications on file     Delos HaringHarris, Simmie Garin, PA-C 02/03/17 1050    Azalia Bilisampos, Kevin, MD 02/03/17 1121

## 2017-02-03 NOTE — ED Notes (Signed)
With chart review noted pt seen 2 days ago for same.

## 2017-02-03 NOTE — ED Triage Notes (Addendum)
Pt complaint of LEFT shoulder pain radiating down LEFT arm worsening over 2 months. "There is a knot there [shoulder]. It has gotten so bad that I can't lift the dang thing up." Pt continues to verbalize pain is worse at night and "numbness sometimes at night." Pt denies injury.

## 2017-02-03 NOTE — ED Notes (Signed)
Bed: WTR9 Expected date:  Expected time:  Means of arrival:  Comments: 

## 2017-02-05 ENCOUNTER — Other Ambulatory Visit (INDEPENDENT_AMBULATORY_CARE_PROVIDER_SITE_OTHER): Payer: Self-pay

## 2017-02-05 ENCOUNTER — Ambulatory Visit (INDEPENDENT_AMBULATORY_CARE_PROVIDER_SITE_OTHER): Payer: Self-pay | Admitting: Orthopaedic Surgery

## 2017-02-05 DIAGNOSIS — M25512 Pain in left shoulder: Principal | ICD-10-CM

## 2017-02-05 DIAGNOSIS — G8929 Other chronic pain: Secondary | ICD-10-CM

## 2017-02-05 NOTE — Progress Notes (Signed)
Office Visit Note   Patient: Victor Long           Date of Birth: 10/04/1960           MRN: [id number] Visit Date: 02/05/2017              Requested by: Dois Davenport, MD [address] [place], [place] [address] PCP: Dois Davenport, MD   Assessment & Plan: Visit Diagnoses:  1. Chronic left shoulder pain     Plan: Impression is left shoulder impingement and rotator cuff syndrome with stable cervical pain.  I reviewed the CT scan of his neck which does show degenerative disc disease but I think this is presenting more like it is coming from his shoulder therefore I do want to get an MRI of the left shoulder to rule out a rotator cuff tear which is my main concern.  Questions encouraged and answered.  Follow-up after the MRI.  Follow-Up Instructions: Return in about 10 days (around 02/15/2017).   Orders:  No orders of the defined types were placed in this encounter.  No orders of the defined types were placed in this encounter.     Procedures: No procedures performed   Clinical Data: No additional findings.   Subjective: No chief complaint on file.   Patient is a 56 year old gentleman who comes in with chronic neck pain and acute left shoulder pain for the last 2 months.  He went to the ED and he had a CT scan of his cervical spine and x-ray of his shoulder.  He states that he has had worsening pain in the shoulder with the use which feels different from the chronic neck pain that he has.  The pain is worse with use of the arm and shoulder.  He denies any numbness and tingling.    Review of Systems  Constitutional: Negative.   All other systems reviewed and are negative.    Objective: Vital Signs: There were no vitals taken for this visit.  Physical Exam  Constitutional: He is oriented to person, place, and time. He appears well-developed and well-nourished.  HENT:  Head: Normocephalic and atraumatic.  Eyes: Pupils are equal, round, and  reactive to light.  Neck: Neck supple.  Pulmonary/Chest: Effort normal.  Abdominal: Soft.  Musculoskeletal: Normal range of motion.  Neurological: He is alert and oriented to person, place, and time.  Skin: Skin is warm.  Psychiatric: He has a normal mood and affect. His behavior is normal. Judgment and thought content normal.  Nursing note and vitals reviewed.   Ortho Exam Left shoulder exam shows significant pain with movement of the shoulder joint.  He has no focal nerve palsies.  He does have significant pain with elevation of the arm and abduction of the shoulder.  He has positive impingement signs.  Pain with empty can testing but no focal deficits. Specialty Comments:  No specialty comments available.  Imaging: No results found.   PMFS History: Patient Active Problem List   Diagnosis Date Noted  . Bipolar affective disorder, current episode depressed (HCC) 07/01/2016  . Bipolar disorder (HCC) 01/09/2016  . Insomnia 01/09/2016  . Diverticulitis 01/04/2016  . BRBPR (bright red blood per rectum) 01/04/2016  . ETOH abuse 01/04/2016   Past Medical History:  Diagnosis Date  . Back pain, chronic   . Bipolar disorder (HCC)   . Depression   . Diverticulitis   . Hypertension   . Sciatica     Family History  Problem Relation Age of Onset  . Cancer Father     Past Surgical History:  Procedure Laterality Date  . BACK SURGERY    . SHOULDER SURGERY     Social History   Occupational History  . Not on file.   Social History Main Topics  . Smoking status: Never Smoker  . Smokeless tobacco: Current User    Types: Chew  . Alcohol use 3.6 - 7.2 oz/week    6 - 12 Cans of beer per week  . Drug use: Yes    Types: Marijuana     Comment: not for 28 years  . Sexual activity: Not on file

## 2017-02-15 ENCOUNTER — Telehealth (INDEPENDENT_AMBULATORY_CARE_PROVIDER_SITE_OTHER): Payer: Self-pay | Admitting: Orthopaedic Surgery

## 2017-02-15 ENCOUNTER — Ambulatory Visit (INDEPENDENT_AMBULATORY_CARE_PROVIDER_SITE_OTHER): Payer: Self-pay | Admitting: Orthopaedic Surgery

## 2017-02-15 ENCOUNTER — Encounter (INDEPENDENT_AMBULATORY_CARE_PROVIDER_SITE_OTHER): Payer: Self-pay

## 2017-02-15 NOTE — Telephone Encounter (Signed)
Patient request a return to work note, please call pt will pick up

## 2017-02-15 NOTE — Telephone Encounter (Signed)
yes

## 2017-02-15 NOTE — Telephone Encounter (Signed)
Is this okay?

## 2017-02-16 NOTE — Telephone Encounter (Signed)
Note ready for pickup at the front desk. Called patient no answer LMOM.

## 2017-02-22 ENCOUNTER — Ambulatory Visit (HOSPITAL_COMMUNITY)
Admission: RE | Admit: 2017-02-22 | Discharge: 2017-02-22 | Disposition: A | Discharge status: Home / Self Care | Payer: Self-pay | Source: Ambulatory Visit | Attending: Orthopaedic Surgery | Admitting: Orthopaedic Surgery

## 2017-02-22 DIAGNOSIS — M25512 Pain in left shoulder: Secondary | ICD-10-CM

## 2017-02-22 DIAGNOSIS — M75122 Complete rotator cuff tear or rupture of left shoulder, not specified as traumatic: Secondary | ICD-10-CM | POA: Insufficient documentation

## 2017-02-22 DIAGNOSIS — M7522 Bicipital tendinitis, left shoulder: Secondary | ICD-10-CM | POA: Insufficient documentation

## 2017-02-22 DIAGNOSIS — G8929 Other chronic pain: Secondary | ICD-10-CM

## 2017-02-22 NOTE — Progress Notes (Signed)
Needs f/u appt 

## 2017-02-23 ENCOUNTER — Ambulatory Visit (INDEPENDENT_AMBULATORY_CARE_PROVIDER_SITE_OTHER): Payer: Self-pay | Admitting: Orthopaedic Surgery

## 2017-02-23 ENCOUNTER — Encounter (INDEPENDENT_AMBULATORY_CARE_PROVIDER_SITE_OTHER): Payer: Self-pay | Admitting: Orthopaedic Surgery

## 2017-02-23 ENCOUNTER — Ambulatory Visit (INDEPENDENT_AMBULATORY_CARE_PROVIDER_SITE_OTHER): Payer: Self-pay

## 2017-02-23 DIAGNOSIS — M19012 Primary osteoarthritis, left shoulder: Secondary | ICD-10-CM | POA: Insufficient documentation

## 2017-02-23 DIAGNOSIS — M67922 Unspecified disorder of synovium and tendon, left upper arm: Secondary | ICD-10-CM | POA: Insufficient documentation

## 2017-02-23 DIAGNOSIS — M19019 Primary osteoarthritis, unspecified shoulder: Secondary | ICD-10-CM

## 2017-02-23 DIAGNOSIS — M75122 Complete rotator cuff tear or rupture of left shoulder, not specified as traumatic: Secondary | ICD-10-CM | POA: Insufficient documentation

## 2017-02-23 MED ORDER — TRAMADOL HCL 50 MG PO TABS: 50.0000 mg | ORAL_TABLET | Freq: Three times a day (TID) | ORAL | 2 refills | Status: DC | PRN

## 2017-02-23 NOTE — Progress Notes (Signed)
Office Visit Note   Patient: Victor Long           Date of Birth: 10/31/1960           MRN: [id number] Visit Date: 02/23/2017              Requested by: Dois Davenportichter, Karen L, MD [address] [place], [place] [address] PCP: Dois Davenportichter, Karen L, MD   Assessment & Plan: Visit Diagnoses:  1. Complete tear of left rotator cuff   2. Tendinopathy of left biceps   3. AC joint arthropathy     Plan: Impression is left rotator cuff tear, biceps tendinopathy, AC joint arthropathy.  Patient is treatment his MRI shows a full-thickness tear of the supraspinatus with 18 mm of retraction.  These findings were discussed with the patient and we agreed to proceed with arthroscopic surgical repair and debridement with possible biceps tenotomy.  We discussed the risk benefits alternatives of surgery including incomplete relief of pain and infection and stiffness.  He understands and wished to proceed.  Follow-Up Instructions: Return for 2 week postop visit.   Orders:  No orders of the defined types were placed in this encounter.  Meds ordered this encounter  Medications  . traMADol (ULTRAM) 50 MG tablet    Sig: Take 1-2 tablets (50-100 mg total) 3 (three) times daily as needed by mouth.    Dispense:  30 tablet    Refill:  2      Procedures: No procedures performed   Clinical Data: No additional findings.   Subjective: Chief Complaint  Patient presents with  . Left Shoulder - Pain, Follow-up    Patient follows up today for his left shoulder MRI.  Continues to have severe pain with use of his left upper arm.  Denies any numbness and tingling.    Review of Systems  Constitutional: Negative.   All other systems reviewed and are negative.    Objective: Vital Signs: There were no vitals taken for this visit.  Physical Exam  Constitutional: He is oriented to person, place, and time. He appears well-developed and well-nourished.  Pulmonary/Chest: Effort normal.    Abdominal: Soft.  Neurological: He is alert and oriented to person, place, and time.  Skin: Skin is warm.  Psychiatric: He has a normal mood and affect. His behavior is normal. Judgment and thought content normal.  Nursing note and vitals reviewed.   Ortho Exam Left shoulder exam is stable.  He has pain with empty can testing.  Cross adduction sign.  Positive Speed test. Specialty Comments:  No specialty comments available.  Imaging: Mr Shoulder Left W/o Contrast  Result Date: 02/22/2017 CLINICAL DATA:  Chronic shoulder pain EXAM: MRI OF THE LEFT SHOULDER WITHOUT CONTRAST TECHNIQUE: Multiplanar, multisequence MR imaging of the shoulder was performed. No intravenous contrast was administered. COMPARISON:  None. FINDINGS: Rotator cuff: Complete tear of the supraspinatus tendon with 18 mm of retraction. Mild tendinosis of the infraspinatus tendon. Teres minor tendon is intact. Moderate tendinosis of the subscapularis tendon. Muscles: No atrophy or fatty replacement of nor abnormal signal within, the muscles of the rotator cuff. Biceps long head: Moderate tendinosis of the intraarticular portion of the long head of the biceps tendon. Acromioclavicular Joint: Moderate arthropathy of the acromioclavicular joint. Type II acromion. Trace subacromial/subdeltoid bursal fluid. Glenohumeral Joint: No joint effusion. No chondral defect. Labrum: Grossly intact, but evaluation is limited by lack of intraarticular fluid. Bones:  No marrow abnormality, fracture or dislocation. Other: No fluid collection  or hematoma. IMPRESSION: 1. Complete tear of the supraspinatus tendon with 18 mm of retraction. 2. Mild tendinosis of the infraspinatus tendon. 3. Moderate tendinosis of the subscapularis tendon. 4. Moderate tendinosis of the intraarticular portion of the long head of the biceps tendon. Electronically Signed   By: Elige KoHetal  Patel   On: 02/22/2017 13:30     PMFS History: Patient Active Problem List   Diagnosis  Date Noted  . Complete tear of left rotator cuff 02/23/2017  . Tendinopathy of left biceps 02/23/2017  . AC joint arthropathy 02/23/2017  . Bipolar affective disorder, current episode depressed (HCC) 07/01/2016  . Bipolar disorder (HCC) 01/09/2016  . Insomnia 01/09/2016  . Diverticulitis 01/04/2016  . BRBPR (bright red blood per rectum) 01/04/2016  . ETOH abuse 01/04/2016   Past Medical History:  Diagnosis Date  . Back pain, chronic   . Bipolar disorder (HCC)   . Depression   . Diverticulitis   . Hypertension   . Sciatica     Family History  Problem Relation Age of Onset  . Cancer Father     Past Surgical History:  Procedure Laterality Date  . BACK SURGERY    . SHOULDER SURGERY     Social History   Occupational History  . Not on file  Tobacco Use  . Smoking status: Never Smoker  . Smokeless tobacco: Current User    Types: Chew  Substance and Sexual Activity  . Alcohol use: Yes    Alcohol/week: 3.6 - 7.2 oz    Types: 6 - 12 Cans of beer per week  . Drug use: Yes    Types: Marijuana    Comment: not for 28 years  . Sexual activity: Not on file

## 2017-02-25 ENCOUNTER — Encounter (HOSPITAL_BASED_OUTPATIENT_CLINIC_OR_DEPARTMENT_OTHER): Payer: Self-pay | Admitting: *Deleted

## 2017-02-26 ENCOUNTER — Other Ambulatory Visit (INDEPENDENT_AMBULATORY_CARE_PROVIDER_SITE_OTHER): Payer: Self-pay | Admitting: Orthopaedic Surgery

## 2017-02-26 DIAGNOSIS — M75101 Unspecified rotator cuff tear or rupture of right shoulder, not specified as traumatic: Secondary | ICD-10-CM

## 2017-03-01 ENCOUNTER — Other Ambulatory Visit: Payer: Self-pay

## 2017-03-01 ENCOUNTER — Encounter (HOSPITAL_BASED_OUTPATIENT_CLINIC_OR_DEPARTMENT_OTHER): Payer: Self-pay | Admitting: *Deleted

## 2017-03-02 ENCOUNTER — Encounter (HOSPITAL_BASED_OUTPATIENT_CLINIC_OR_DEPARTMENT_OTHER): Payer: Self-pay | Admitting: Anesthesiology

## 2017-03-02 NOTE — Anesthesia Preprocedure Evaluation (Addendum)
Anesthesia Evaluation  Patient identified by MRN, date of birth, ID band Patient awake    Reviewed: Allergy & Precautions, NPO status , Patient's Chart, lab work & pertinent test results  Airway Mallampati: II       Dental no notable dental hx. (+) Teeth Intact   Pulmonary neg pulmonary ROS,    Pulmonary exam normal breath sounds clear to auscultation       Cardiovascular negative cardio ROS Normal cardiovascular exam Rhythm:Regular Rate:Normal     Neuro/Psych PSYCHIATRIC DISORDERS Bipolar Disorder    GI/Hepatic Neg liver ROS,   Endo/Other  negative endocrine ROS  Renal/GU negative Renal ROS  negative genitourinary   Musculoskeletal   Abdominal Normal abdominal exam  (+)   Peds  Hematology negative hematology ROS (+)   Anesthesia Other Findings   Reproductive/Obstetrics                            Anesthesia Physical Anesthesia Plan  ASA: II  Anesthesia Plan: General   Post-op Pain Management: GA combined w/ Regional for post-op pain   Induction: Intravenous  PONV Risk Score and Plan: 2 and Ondansetron and Dexamethasone  Airway Management Planned: Oral ETT  Additional Equipment:   Intra-op Plan:   Post-operative Plan: Extubation in OR  Informed Consent: I have reviewed the patients History and Physical, chart, labs and discussed the procedure including the risks, benefits and alternatives for the proposed anesthesia with the patient or authorized representative who has indicated his/her understanding and acceptance.   Dental advisory given  Plan Discussed with: CRNA and Surgeon  Anesthesia Plan Comments:        Anesthesia Quick Evaluation

## 2017-03-03 ENCOUNTER — Encounter (HOSPITAL_BASED_OUTPATIENT_CLINIC_OR_DEPARTMENT_OTHER): Payer: Self-pay | Admitting: Certified Registered"

## 2017-03-03 ENCOUNTER — Encounter (HOSPITAL_BASED_OUTPATIENT_CLINIC_OR_DEPARTMENT_OTHER)
Admission: RE | Disposition: A | Discharge status: Home / Self Care | Payer: Self-pay | Source: Ambulatory Visit | Attending: Orthopaedic Surgery

## 2017-03-03 ENCOUNTER — Ambulatory Visit (HOSPITAL_BASED_OUTPATIENT_CLINIC_OR_DEPARTMENT_OTHER): Payer: Self-pay | Admitting: Anesthesiology

## 2017-03-03 ENCOUNTER — Other Ambulatory Visit: Payer: Self-pay

## 2017-03-03 ENCOUNTER — Ambulatory Visit (HOSPITAL_BASED_OUTPATIENT_CLINIC_OR_DEPARTMENT_OTHER)
Admission: RE | Admit: 2017-03-03 | Discharge: 2017-03-03 | Disposition: A | Discharge status: Home / Self Care | Payer: Self-pay | Source: Ambulatory Visit | Attending: Orthopaedic Surgery | Admitting: Orthopaedic Surgery

## 2017-03-03 DIAGNOSIS — X58XXXA Exposure to other specified factors, initial encounter: Secondary | ICD-10-CM | POA: Insufficient documentation

## 2017-03-03 DIAGNOSIS — M75101 Unspecified rotator cuff tear or rupture of right shoulder, not specified as traumatic: Secondary | ICD-10-CM

## 2017-03-03 DIAGNOSIS — M94212 Chondromalacia, left shoulder: Secondary | ICD-10-CM | POA: Insufficient documentation

## 2017-03-03 DIAGNOSIS — M67922 Unspecified disorder of synovium and tendon, left upper arm: Secondary | ICD-10-CM

## 2017-03-03 DIAGNOSIS — M7542 Impingement syndrome of left shoulder: Secondary | ICD-10-CM | POA: Insufficient documentation

## 2017-03-03 DIAGNOSIS — M19012 Primary osteoarthritis, left shoulder: Secondary | ICD-10-CM

## 2017-03-03 DIAGNOSIS — S43402A Unspecified sprain of left shoulder joint, initial encounter: Secondary | ICD-10-CM | POA: Insufficient documentation

## 2017-03-03 DIAGNOSIS — M75122 Complete rotator cuff tear or rupture of left shoulder, not specified as traumatic: Secondary | ICD-10-CM | POA: Insufficient documentation

## 2017-03-03 DIAGNOSIS — F329 Major depressive disorder, single episode, unspecified: Secondary | ICD-10-CM | POA: Insufficient documentation

## 2017-03-03 DIAGNOSIS — Z79899 Other long term (current) drug therapy: Secondary | ICD-10-CM | POA: Insufficient documentation

## 2017-03-03 DIAGNOSIS — Y9289 Other specified places as the place of occurrence of the external cause: Secondary | ICD-10-CM | POA: Insufficient documentation

## 2017-03-03 DIAGNOSIS — K219 Gastro-esophageal reflux disease without esophagitis: Secondary | ICD-10-CM | POA: Insufficient documentation

## 2017-03-03 DIAGNOSIS — G8929 Other chronic pain: Secondary | ICD-10-CM | POA: Insufficient documentation

## 2017-03-03 DIAGNOSIS — M7522 Bicipital tendinitis, left shoulder: Secondary | ICD-10-CM | POA: Insufficient documentation

## 2017-03-03 HISTORY — DX: Gastro-esophageal reflux disease without esophagitis: K21.9

## 2017-03-03 HISTORY — PX: SHOULDER ARTHROSCOPY WITH ROTATOR CUFF REPAIR AND SUBACROMIAL DECOMPRESSION: SHX5686

## 2017-03-03 SURGERY — SHOULDER ARTHROSCOPY WITH ROTATOR CUFF REPAIR AND SUBACROMIAL DECOMPRESSION
Anesthesia: General | Site: Shoulder | Laterality: Left

## 2017-03-03 MED ORDER — SENNOSIDES-DOCUSATE SODIUM 8.6-50 MG PO TABS: 1.0000 | ORAL_TABLET | Freq: Every evening | ORAL | 1 refills | Status: DC | PRN

## 2017-03-03 MED ORDER — OXYCODONE HCL 5 MG PO TABS: 5.0000 mg | ORAL_TABLET | ORAL | 0 refills | Status: DC | PRN

## 2017-03-03 MED ORDER — ACETAMINOPHEN 160 MG/5ML PO SOLN: 325.0000 mg | ORAL | Status: DC | PRN

## 2017-03-03 MED ORDER — MIDAZOLAM HCL 2 MG/2ML IJ SOLN
INTRAMUSCULAR | Status: AC
  Filled 2017-03-03: qty 2

## 2017-03-03 MED ORDER — FENTANYL CITRATE (PF) 100 MCG/2ML IJ SOLN
INTRAMUSCULAR | Status: DC | PRN
  Administered 2017-03-03: 100 ug via INTRAVENOUS

## 2017-03-03 MED ORDER — ONDANSETRON HCL 4 MG PO TABS: 4.0000 mg | ORAL_TABLET | Freq: Three times a day (TID) | ORAL | 0 refills | Status: DC | PRN

## 2017-03-03 MED ORDER — KETOROLAC TROMETHAMINE 30 MG/ML IJ SOLN: 30.0000 mg | Freq: Once | INTRAMUSCULAR | Status: DC | PRN

## 2017-03-03 MED ORDER — OXYCODONE-ACETAMINOPHEN 5-325 MG PO TABS: 1.0000 | ORAL_TABLET | ORAL | 0 refills | Status: DC | PRN

## 2017-03-03 MED ORDER — BUPIVACAINE-EPINEPHRINE (PF) 0.25% -1:200000 IJ SOLN
INTRAMUSCULAR | Status: AC
  Filled 2017-03-03: qty 30

## 2017-03-03 MED ORDER — MIDAZOLAM HCL 2 MG/2ML IJ SOLN
1.0000 mg | INTRAMUSCULAR | Status: DC | PRN
  Administered 2017-03-03: 2 mg via INTRAVENOUS

## 2017-03-03 MED ORDER — OXYCODONE HCL 5 MG PO TABS: 5.0000 mg | ORAL_TABLET | Freq: Once | ORAL | Status: DC | PRN

## 2017-03-03 MED ORDER — DEXAMETHASONE SODIUM PHOSPHATE 4 MG/ML IJ SOLN
INTRAMUSCULAR | Status: DC | PRN
  Administered 2017-03-03: 10 mg via INTRAVENOUS

## 2017-03-03 MED ORDER — FENTANYL CITRATE (PF) 100 MCG/2ML IJ SOLN
50.0000 ug | INTRAMUSCULAR | Status: DC | PRN
  Administered 2017-03-03: 100 ug via INTRAVENOUS

## 2017-03-03 MED ORDER — SUCCINYLCHOLINE CHLORIDE 20 MG/ML IJ SOLN
INTRAMUSCULAR | Status: DC | PRN
  Administered 2017-03-03: 120 mg via INTRAVENOUS

## 2017-03-03 MED ORDER — PHENYLEPHRINE HCL 10 MG/ML IJ SOLN
INTRAMUSCULAR | Status: DC | PRN
  Administered 2017-03-03: 40 ug via INTRAVENOUS
  Administered 2017-03-03: 80 ug via INTRAVENOUS

## 2017-03-03 MED ORDER — PROMETHAZINE HCL 25 MG PO TABS: 25.0000 mg | ORAL_TABLET | Freq: Four times a day (QID) | ORAL | 1 refills | Status: DC | PRN

## 2017-03-03 MED ORDER — FENTANYL CITRATE (PF) 100 MCG/2ML IJ SOLN
INTRAMUSCULAR | Status: AC
  Filled 2017-03-03: qty 2

## 2017-03-03 MED ORDER — CEFAZOLIN SODIUM-DEXTROSE 2-4 GM/100ML-% IV SOLN
INTRAVENOUS | Status: AC
  Filled 2017-03-03: qty 100

## 2017-03-03 MED ORDER — CEFAZOLIN SODIUM-DEXTROSE 2-4 GM/100ML-% IV SOLN
2.0000 g | INTRAVENOUS | Status: AC
  Administered 2017-03-03: 2 g via INTRAVENOUS

## 2017-03-03 MED ORDER — BUPIVACAINE-EPINEPHRINE (PF) 0.25% -1:200000 IJ SOLN
INTRAMUSCULAR | Status: DC | PRN
  Administered 2017-03-03: 3 mL via PERINEURAL
  Administered 2017-03-03 (×4): 5 mL via PERINEURAL

## 2017-03-03 MED ORDER — LIDOCAINE HCL (CARDIAC) 20 MG/ML IV SOLN
INTRAVENOUS | Status: DC | PRN
  Administered 2017-03-03: 30 mg via INTRAVENOUS

## 2017-03-03 MED ORDER — LACTATED RINGERS IV SOLN
INTRAVENOUS | Status: DC
  Administered 2017-03-03 (×2): via INTRAVENOUS

## 2017-03-03 MED ORDER — MEPERIDINE HCL 25 MG/ML IJ SOLN: 6.2500 mg | INTRAMUSCULAR | Status: DC | PRN

## 2017-03-03 MED ORDER — ONDANSETRON HCL 4 MG/2ML IJ SOLN
INTRAMUSCULAR | Status: DC | PRN
  Administered 2017-03-03: 4 mg via INTRAVENOUS

## 2017-03-03 MED ORDER — PROPOFOL 10 MG/ML IV BOLUS
INTRAVENOUS | Status: DC | PRN
  Administered 2017-03-03: 200 mg via INTRAVENOUS

## 2017-03-03 MED ORDER — ACETAMINOPHEN 325 MG PO TABS: 325.0000 mg | ORAL_TABLET | ORAL | Status: DC | PRN

## 2017-03-03 MED ORDER — TIZANIDINE HCL 4 MG PO TABS: 4.0000 mg | ORAL_TABLET | Freq: Four times a day (QID) | ORAL | 2 refills | Status: DC | PRN

## 2017-03-03 MED ORDER — SCOPOLAMINE 1 MG/3DAYS TD PT72: 1.0000 | MEDICATED_PATCH | Freq: Once | TRANSDERMAL | Status: DC | PRN

## 2017-03-03 MED ORDER — HYDROMORPHONE HCL 1 MG/ML IJ SOLN: 0.2500 mg | INTRAMUSCULAR | Status: DC | PRN

## 2017-03-03 MED ORDER — OXYCODONE HCL 5 MG/5ML PO SOLN: 5.0000 mg | Freq: Once | ORAL | Status: DC | PRN

## 2017-03-03 MED ORDER — ONDANSETRON HCL 4 MG/2ML IJ SOLN: 4.0000 mg | Freq: Once | INTRAMUSCULAR | Status: DC | PRN

## 2017-03-03 MED ORDER — BUPIVACAINE-EPINEPHRINE 0.25% -1:200000 IJ SOLN
INTRAMUSCULAR | Status: DC | PRN
  Administered 2017-03-03: 20 mL

## 2017-03-03 SURGICAL SUPPLY — 67 items
2MM HIFI TAPE ×3 IMPLANT
BENZOIN TINCTURE PRP APPL 2/3 (GAUZE/BANDAGES/DRESSINGS) IMPLANT
BLADE 4.2CUDA (BLADE) ×3 IMPLANT
BLADE CUTTER GATOR 3.5 (BLADE) IMPLANT
BLADE GREAT WHITE 4.2 (BLADE) IMPLANT
BLADE GREAT WHITE 4.2MM (BLADE)
BLADE SURG 15 STRL LF DISP TIS (BLADE) IMPLANT
BLADE SURG 15 STRL SS (BLADE)
BUR OVAL 4.0 (BURR) ×3 IMPLANT
CANNULA 5.75X71 LONG (CANNULA) ×3 IMPLANT
CANNULA TWIST IN 8.25X7CM (CANNULA) ×3 IMPLANT
CANNULA TWIST IN 8.25X9CM (CANNULA) ×3 IMPLANT
CLOSURE STERI-STRIP 1/2X4 (GAUZE/BANDAGES/DRESSINGS)
CLSR STERI-STRIP ANTIMIC 1/2X4 (GAUZE/BANDAGES/DRESSINGS) IMPLANT
DECANTER SPIKE VIAL GLASS SM (MISCELLANEOUS) IMPLANT
DRAPE IMP U-DRAPE 54X76 (DRAPES) ×3 IMPLANT
DRAPE INCISE IOBAN 66X45 STRL (DRAPES) ×3 IMPLANT
DRAPE STERI 35X30 U-POUCH (DRAPES) ×3 IMPLANT
DRAPE SURG 17X23 STRL (DRAPES) ×3 IMPLANT
DRAPE U-SHAPE 47X51 STRL (DRAPES) ×6 IMPLANT
DRAPE U-SHAPE 76X120 STRL (DRAPES) ×6 IMPLANT
DRSG PAD ABDOMINAL 8X10 ST (GAUZE/BANDAGES/DRESSINGS) ×6 IMPLANT
DURAPREP 26ML APPLICATOR (WOUND CARE) ×6 IMPLANT
ELECT REM PT RETURN 9FT ADLT (ELECTROSURGICAL)
ELECTRODE REM PT RTRN 9FT ADLT (ELECTROSURGICAL) IMPLANT
GAUZE SPONGE 4X4 12PLY STRL (GAUZE/BANDAGES/DRESSINGS) ×3 IMPLANT
GAUZE XEROFORM 1X8 LF (GAUZE/BANDAGES/DRESSINGS) ×3 IMPLANT
GLOVE BIO SURGEON STRL SZ 6.5 (GLOVE) ×4 IMPLANT
GLOVE BIO SURGEONS STRL SZ 6.5 (GLOVE) ×2
GLOVE BIOGEL PI IND STRL 7.0 (GLOVE) ×3 IMPLANT
GLOVE BIOGEL PI INDICATOR 7.0 (GLOVE) ×6
GLOVE SKINSENSE NS SZ7.5 (GLOVE) ×2
GLOVE SKINSENSE STRL SZ7.5 (GLOVE) ×1 IMPLANT
GLOVE SURG SYN 7.5  E (GLOVE) ×2
GLOVE SURG SYN 7.5 E (GLOVE) ×1 IMPLANT
GOWN STRL REIN XL XLG (GOWN DISPOSABLE) ×3 IMPLANT
GOWN STRL REUS W/ TWL LRG LVL3 (GOWN DISPOSABLE) ×1 IMPLANT
GOWN STRL REUS W/TWL LRG LVL3 (GOWN DISPOSABLE) ×2
IMMOBILIZER SHOULDER FOAM XLGE (SOFTGOODS) IMPLANT
KIT SHOULDER TRACTION (DRAPES) ×3 IMPLANT
MANIFOLD NEPTUNE II (INSTRUMENTS) ×3 IMPLANT
NEEDLE SCORPION MULTI FIRE (NEEDLE) ×3 IMPLANT
PACK ARTHROSCOPY DSU (CUSTOM PROCEDURE TRAY) ×3 IMPLANT
PACK BASIN DAY SURGERY FS (CUSTOM PROCEDURE TRAY) ×3 IMPLANT
PROBE BIPOLAR ATHRO 135MM 90D (MISCELLANEOUS) ×3 IMPLANT
SET ARTHROSCOPY TUBING (MISCELLANEOUS) ×2
SET ARTHROSCOPY TUBING LN (MISCELLANEOUS) ×1 IMPLANT
SHEET MEDIUM DRAPE 40X70 STRL (DRAPES) ×3 IMPLANT
SLEEVE SCD COMPRESS KNEE MED (MISCELLANEOUS) ×3 IMPLANT
SLING ARM FOAM STRAP LRG (SOFTGOODS) IMPLANT
SLING ARM IMMOBILIZER LRG (SOFTGOODS) IMPLANT
SLING ARM IMMOBILIZER MED (SOFTGOODS) IMPLANT
SLING ARM MED ADULT FOAM STRAP (SOFTGOODS) IMPLANT
SLING ARM XL FOAM STRAP (SOFTGOODS) IMPLANT
SUT ETHILON 3 0 PS 1 (SUTURE) ×3 IMPLANT
SUT FIBERWIRE #2 38 T-5 BLUE (SUTURE)
SUT HI-FI 2 STRAND C-2 40 (SUTURE) ×3 IMPLANT
SUT TIGER TAPE 7 IN WHITE (SUTURE) IMPLANT
SUTURE FIBERWR #2 38 T-5 BLUE (SUTURE) IMPLANT
SYR 50ML LL SCALE MARK (SYRINGE) ×3 IMPLANT
TAPE FIBER 2MM 7IN #2 BLUE (SUTURE) IMPLANT
TOWEL OR 17X24 6PK STRL BLUE (TOWEL DISPOSABLE) ×3 IMPLANT
TOWEL OR NON WOVEN STRL DISP B (DISPOSABLE) ×3 IMPLANT
TUBE CONNECTING 20'X1/4 (TUBING)
TUBE CONNECTING 20X1/4 (TUBING) IMPLANT
WATER STERILE IRR 1000ML POUR (IV SOLUTION) ×3 IMPLANT
crossft knotless suture anchor (Anchor) ×3 IMPLANT

## 2017-03-03 NOTE — Progress Notes (Signed)
Assisted Dr. Hatchett with left, ultrasound guided, supraclavicular block. Side rails up, monitors on throughout procedure. See vital signs in flow sheet. Tolerated Procedure well. 

## 2017-03-03 NOTE — Anesthesia Postprocedure Evaluation (Signed)
Anesthesia Post Note  Patient: Victor FuelJeffery Long  Procedure(s) Performed: LEFT SHOULDER ARTHROSCOPY WITH EXTENSIVE DEBRIDEMENT, ROTATOR CUFF REPAIR, SUBACROMIAL DECOMPRESSION, DISTAL CLAVICLE EXCISION (Left Shoulder)     Patient location during evaluation: PACU Anesthesia Type: General and Regional Level of consciousness: awake Pain management: pain level controlled Vital Signs Assessment: post-procedure vital signs reviewed and stable Respiratory status: spontaneous breathing Cardiovascular status: stable Postop Assessment: no apparent nausea or vomiting Anesthetic complications: no    Last Vitals:  Vitals:   03/03/17 1100 03/03/17 1115  BP: (!) 131/94 (!) 128/94  Pulse: 92 91  Resp: (!) 22 16  Temp:    SpO2: 95% 97%    Last Pain:  Vitals:   03/03/17 1115  TempSrc:   PainSc: 0-No pain   Pain Goal: Patients Stated Pain Goal: 2 (03/03/17 [id number])               Bina Veenstra JR,JOHN Susann GivensFRANKLIN

## 2017-03-03 NOTE — Discharge Instructions (Signed)
Post-operative patient instructions  Shoulder Arthroscopy    Ice:  Place intermittent ice or cooler pack over your shoulder, 30 minutes on and 30 minutes off.  Continue this for the first 72 hours after surgery, then save ice for use after therapy sessions or on more active days.    Weight:  You may NOT bear weight on your arm as your symptoms allow.  Motion:  Remain in shoulder sling at all times including to bed.  Dressing:  Perform 1st dressing change at 2 days postoperative. A moderate amount of blood tinged drainage is to be expected.  So if you bleed through the dressing on the first or second day or if you have fevers, it is fine to change the dressing/check the wounds early and redress wound.  If it bleeds through again, or if the incisions are leaking frank blood, please call the office. May change dressing every 1-2 days thereafter to help watch wounds. Can purchase Tegaderm (or 24M Nexcare) water resistant dressings at local pharmacy / [facility].  Shower:  Light shower is ok after 2 days.  Please take shower, NO bath. Recover with gauze and ace wrap to help keep wounds protected.    Pain medication:  A narcotic pain medication has been prescribed.  Take as directed.  Typically you need narcotic pain medication more regularly during the first 3 to 5 days after surgery.  Decrease your use of the medication as the pain improves.  Narcotics can sometimes cause constipation, even after a few doses.  If you have problems with constipation, you can take an over the counter stool softener or light laxative.  If you have persistent problems, please notify your physicians office.  Physical therapy: Additional activity guidelines to be provided by your physician or physical therapist at follow-up visits.   Driving: Do not recommend driving x 2 weeks post surgical, especially if surgery performed on right side. Should not drive while taking narcotic pain medications. It typically takes at least  2 weeks to restore sufficient neuromuscular function for normal reaction times for driving safety.   Call [phone] for questions or problems. Evenings you will be forwarded to the hospital operator.  Ask for the orthopaedic physician on call. Please call if you experience:    o Redness, foul smelling, or persistent drainage from the surgical site  o worsening shoulder pain and swelling not responsive to medication  o any calf pain and or swelling of the lower leg  o temperatures greater than 101.5 F o other questions or concerns   Thank you for allowing us to be a part of your care.   Post Anesthesia Home Care Instructions  Activity: Get plenty of rest for the remainder of the day. A responsible individual must stay with you for 24 hours following the procedure.  For the next 24 hours, DO NOT: -Drive a car -Advertising copywriterperate machinery -Drink alcoholic beverages -Take any medication unless instructed by your physician -Make any legal decisions or sign important papers.  Meals: Start with liquid foods such as gelatin or soup. Progress to regular foods as tolerated. Avoid greasy, spicy, heavy foods. If nausea and/or vomiting occur, drink only clear liquids until the nausea and/or vomiting subsides. Call your physician if vomiting continues.  Special Instructions/Symptoms: Your throat may feel dry or sore from the anesthesia or the breathing tube placed in your throat during surgery. If this causes discomfort, gargle with warm salt water. The discomfort should disappear within 24 hours.  If you  had a scopolamine patch placed behind your ear for the management of post- operative nausea and/or vomiting:  1. The medication in the patch is effective for 72 hours, after which it should be removed.  Wrap patch in a tissue and discard in the trash. Wash hands thoroughly with soap and water. 2. You may remove the patch earlier than 72 hours if you experience unpleasant side effects which may include  dry mouth, dizziness or visual disturbances. 3. Avoid touching the patch. Wash your hands with soap and water after contact with the patch.   Regional Anesthesia Blocks  1. Numbness or the inability to move the "blocked" extremity may last from 3-48 hours after placement. The length of time depends on the medication injected and your individual response to the medication. If the numbness is not going away after 48 hours, call your surgeon.  2. The extremity that is blocked will need to be protected until the numbness is gone and the  Strength has returned. Because you cannot feel it, you will need to take extra care to avoid injury. Because it may be weak, you may have difficulty moving it or using it. You may not know what position it is in without looking at it while the block is in effect.  3. For blocks in the legs and feet, returning to weight bearing and walking needs to be done carefully. You will need to wait until the numbness is entirely gone and the strength has returned. You should be able to move your leg and foot normally before you try and bear weight or walk. You will need someone to be with you when you first try to ensure you do not fall and possibly risk injury.  4. Bruising and tenderness at the needle site are common side effects and will resolve in a few days.  5. Persistent numbness or new problems with movement should be communicated to the surgeon or the [facility] [phone])/ [facility] [phone]).

## 2017-03-03 NOTE — H&P (Signed)
    PREOPERATIVE H&P  Chief Complaint: left shoulder rotator cuff tear, acromioclavicular joint osteoarthritis, biceps tenodesis  HPI: Victor Long is a 56 y.o. male who presents for surgical treatment of left shoulder rotator cuff tear, acromioclavicular joint osteoarthritis, biceps tenodesis.  He denies any changes in medical history.  Past Medical History:  Diagnosis Date  . Back pain, chronic   . Bipolar disorder (HCC)   . Depression   . Diverticulitis   . GERD (gastroesophageal reflux disease)   . Sciatica    Past Surgical History:  Procedure Laterality Date  . PILONIDAL CYST DRAINAGE    . SHOULDER SURGERY     cyst removal   Social History   Socioeconomic History  . Marital status: Single    Spouse name: None  . Number of children: None  . Years of education: None  . Highest education level: None  Social Needs  . Financial resource strain: None  . Food insecurity - worry: None  . Food insecurity - inability: None  . Transportation needs - medical: None  . Transportation needs - non-medical: None  Occupational History  . None  Tobacco Use  . Smoking status: Never Smoker  . Smokeless tobacco: Current User    Types: Chew  Substance and Sexual Activity  . Alcohol use: Yes    Alcohol/week: 3.6 - 7.2 oz    Types: 6 - 12 Cans of beer per week    Comment: daily 6-7 beers  . Drug use: Yes    Types: Marijuana    Comment: not for 28 years  . Sexual activity: None  Other Topics Concern  . None  Social History Narrative  . None   Family History  Problem Relation Age of Onset  . Cancer Father    Allergies  Allergen Reactions  . Other Other (See Comments)    Hair dye-chemical burn   Prior to Admission medications   Medication Sig Start Date End Date Taking? Authorizing Provider  naproxen sodium (ALEVE) 220 MG tablet Take 220 mg by mouth.   Yes [provider]  traMADol (ULTRAM) 50 MG tablet Take 1-2 tablets (50-100 mg total) 3 (three) times  daily as needed by mouth. 02/23/17   Tarry KosXu, Diahn Waidelich M, MD     Positive ROS: All other systems have been reviewed and were otherwise negative with the exception of those mentioned in the HPI and as above.  Physical Exam: General: Alert, no acute distress Cardiovascular: No pedal edema Respiratory: No cyanosis, no use of accessory musculature GI: abdomen soft Skin: No lesions in the area of chief complaint Neurologic: Sensation intact distally Psychiatric: Patient is competent for consent with normal mood and affect Lymphatic: no lymphedema  MUSCULOSKELETAL: exam stable  Assessment: left shoulder rotator cuff tear, acromioclavicular joint osteoarthritis, biceps tenodesis  Plan: Plan for Procedure(s): LEFT SHOULDER ARTHROSCOPY WITH EXTENSIVE DEBRIDEMENT, ROTATOR CUFF REPAIR, SUBACROMIAL DECOMPRESSION, DISTAL CLAVICLE EXCISION  The risks benefits and alternatives were discussed with the patient including but not limited to the risks of nonoperative treatment, versus surgical intervention including infection, bleeding, nerve injury,  blood clots, cardiopulmonary complications, morbidity, mortality, among others, and they were willing to proceed.   Glee ArvinMichael Eyanna Mcgonagle, MD   03/03/2017 8:08 AM

## 2017-03-03 NOTE — Transfer of Care (Signed)
Immediate Anesthesia Transfer of Care Note  Patient: Victor FuelJeffery Long  Procedure(s) Performed: LEFT SHOULDER ARTHROSCOPY WITH EXTENSIVE DEBRIDEMENT, ROTATOR CUFF REPAIR, SUBACROMIAL DECOMPRESSION, DISTAL CLAVICLE EXCISION (Left Shoulder)  Patient Location: PACU  Anesthesia Type:GA combined with regional for post-op pain  Level of Consciousness: awake, alert , oriented and patient cooperative  Airway & Oxygen Therapy: Patient Spontanous Breathing and Patient connected to face mask oxygen  Post-op Assessment: Report given to RN and Post -op Vital signs reviewed and stable  Post vital signs: Reviewed and stable  Last Vitals:  Vitals:   03/03/17 0815 03/03/17 0820  BP: 111/73   Pulse: 81 88  Resp: 14   Temp:    SpO2: 100% 100%    Last Pain:  Vitals:   03/03/17 0652  TempSrc: Oral  PainSc: 8       Patients Stated Pain Goal: 2 (03/03/17 [id number])  Complications: No apparent anesthesia complications

## 2017-03-03 NOTE — Anesthesia Procedure Notes (Addendum)
Anesthesia Regional Block: Interscalene brachial plexus block   Pre-Anesthetic Checklist: ,, timeout performed, Correct Patient, Correct Site, Correct Laterality, Correct Procedure, Correct Position, site marked, Risks and benefits discussed,  Surgical consent,  Pre-op evaluation,  At surgeon's request and post-op pain management  Laterality: Left and Upper  Prep: chloraprep       Needles:  Injection technique: Single-shot  Needle Type: Echogenic Stimulator Needle     Needle Length: 10cm  Needle Gauge: 21   Needle insertion depth: 2 cm   Additional Needles:   Procedures:,,,, ultrasound used (permanent image in chart),,,,  Narrative:  Start time: 03/03/2017 8:00 AM End time: 03/03/2017 8:10 AM Injection made incrementally with aspirations every 5 mL. Anesthesiologist: Leilani AbleHatchett, Helmut Hennon, MD

## 2017-03-03 NOTE — Op Note (Signed)
Date of Surgery: 08/21/2015  INDICATIONS: The patient is a 56 y.o.-year-old male with left shoulder pain that has failed conservative treatment;  The patient did consent to the procedure after discussion of the risks and benefits.  PREOPERATIVE DIAGNOSIS:  1.  Full-thickness supraspinatus tear 2.  Left AC joint arthropathy 3.  Left long head biceps tendinosis 4.  Chondromalacia of the left shoulder joint 5.  Degenerative labral tear 6.  Left shoulder impingement syndrome  POSTOPERATIVE DIAGNOSIS: Same.  PROCEDURE:  1.  Arthroscopic left shoulder rotator cuff repair of supraspinatus tendon 2.  Arthroscopic left shoulder distal clavicle excision 3.  Arthroscopic left shoulder extensive debridement of the labrum, humeral head, glenoid, subacromial bursa, supraspinatus and infraspinatus tendon 4.  Arthroscopic left shoulder acromioplasty and subacromial decompression with coracoacromial ligament release  SURGEON: N. Glee ArvinMichael Xu, M.D.  ASSIST: None.  ANESTHESIA:  general, regional  IV FLUIDS AND URINE: See anesthesia.  ESTIMATED BLOOD LOSS: minimal mL.  IMPLANTS: Linvatec 4.75 swivel lock  COMPLICATIONS: None.  DESCRIPTION OF PROCEDURE: The patient was brought to the operating room and placed supine on the operating table.  The patient had been signed prior to the procedure and this was documented. The patient had the anesthesia placed by the anesthesiologist.  A time-out was performed to confirm that this was the correct patient, site, side and location. The patient did receive antibiotics prior to the incision and was re-dosed during the procedure as needed at indicated intervals.  The patient was then moved into the lateral position with the operative extremity suspended in the fishing pole mechanism. The patient had the operative extremity prepped and draped in the standard surgical fashion.    A posterior shoulder arthroscopy portal was created and established.  We then made an  anterior portal using outside in technique.  Once the portals were established we then first visualize the glenohumeral joint.  We were able to see the full thickness retracted supraspinatus tear from the shoulder joint.  He also had tendinosis of the biceps anchor and a degenerative tear of the anterior labrum as well as chondromalacia of the humeral head and glenoid which were all debrided using oscillating shaver back to a stable border.  After this was performed we then repositioned the arthroscope into the subacromial space.  Subacromial decompression was then performed using an oscillating shaver by excising the subacromial bursa and the subdeltoid bursa as well as performing an acromioplasty using a high-speed bur.  The distal clavicle and the [facility] was then identified and a distal clavicle excision was performed using a high-speed bur taking approximately 5 mm of the distal clavicle.  Once this was done we then gently debrided the supraspinatus and infraspinatus tendons using an oscillating shaver back to healthy tissue.  A tissue grasper was then used to mobilize the supraspinatus tendon.  We were able to pull the edge of the tear back down it to its footprint.  The footprint was then prepared using a high-speed bur back to bleeding bone.  A single row repair of the supraspinatus tendon in a mattress fashion was performed.  A single 4.5 mm swivel lock was used to perform the repair.  The edge of the supraspinatus tendon mobilized well back to this footprint.  Excess fluid was then drained from the shoulder and the subacromial space.  Incisions were closed with interrupted 3-0 nylon sutures.  Sterile dressings were applied.  The shoulder was placed in a shoulder immobilizer.  Patient tolerated procedure well had no  immediate complications  POSTOPERATIVE PLAN: Patient will be discharged home.  Follow-up in 2 weeks for suture removal.  Mayra ReelN. Michael Xu, MD [facility] [phone] 10:16  AM

## 2017-03-03 NOTE — Anesthesia Procedure Notes (Signed)
Procedure Name: Intubation Date/Time: 03/03/2017 8:26 AM Performed by: Signe Colt, CRNA Pre-anesthesia Checklist: Patient identified, Emergency Drugs available, Suction available and Patient being monitored Patient Re-evaluated:Patient Re-evaluated prior to induction Oxygen Delivery Method: Circle system utilized Preoxygenation: Pre-oxygenation with 100% oxygen Induction Type: IV induction Ventilation: Mask ventilation without difficulty Laryngoscope Size: Mac and 3 Grade View: Grade I Tube type: Oral Number of attempts: 1 Airway Equipment and Method: Stylet and Oral airway Placement Confirmation: ETT inserted through vocal cords under direct vision,  positive ETCO2 and breath sounds checked- equal and bilateral Tube secured with: Tape Dental Injury: Teeth and Oropharynx as per pre-operative assessment

## 2017-03-08 ENCOUNTER — Encounter (HOSPITAL_BASED_OUTPATIENT_CLINIC_OR_DEPARTMENT_OTHER): Payer: Self-pay | Admitting: Orthopaedic Surgery

## 2017-03-18 ENCOUNTER — Ambulatory Visit (INDEPENDENT_AMBULATORY_CARE_PROVIDER_SITE_OTHER): Payer: Self-pay | Admitting: Orthopaedic Surgery

## 2017-03-18 ENCOUNTER — Encounter (INDEPENDENT_AMBULATORY_CARE_PROVIDER_SITE_OTHER): Payer: Self-pay | Admitting: Orthopaedic Surgery

## 2017-03-18 DIAGNOSIS — M19019 Primary osteoarthritis, unspecified shoulder: Secondary | ICD-10-CM

## 2017-03-18 DIAGNOSIS — M75122 Complete rotator cuff tear or rupture of left shoulder, not specified as traumatic: Secondary | ICD-10-CM

## 2017-03-18 DIAGNOSIS — M67922 Unspecified disorder of synovium and tendon, left upper arm: Secondary | ICD-10-CM

## 2017-03-18 NOTE — Progress Notes (Signed)
Patient is two-week status post left shoulder rotator cuff repair and debridement.  He is doing well.  Denies any significant pain.  His incisions have healed without signs of infection or drainage.  Neurovascularly intact.  Sutures removed today.  Arthroscopy pictures were patient.  Physical therapy and home exercises.  Nonweightbearing for 4 more weeks.  May perform gentle range of motion with arm by his side and elbow range of motion.  Follow-up in 4 weeks

## 2017-04-15 ENCOUNTER — Ambulatory Visit (INDEPENDENT_AMBULATORY_CARE_PROVIDER_SITE_OTHER): Payer: Self-pay | Admitting: Orthopaedic Surgery

## 2018-02-24 ENCOUNTER — Emergency Department: Payer: Medicaid Other

## 2018-02-24 ENCOUNTER — Emergency Department
Admission: EM | Admit: 2018-02-24 | Discharge: 2018-02-24 | Disposition: A | Discharge status: Home / Self Care | Payer: Medicaid Other | Attending: Emergency Medicine | Admitting: Emergency Medicine

## 2018-02-24 ENCOUNTER — Other Ambulatory Visit: Payer: Self-pay

## 2018-02-24 ENCOUNTER — Encounter: Payer: Self-pay | Admitting: Emergency Medicine

## 2018-02-24 DIAGNOSIS — Z79899 Other long term (current) drug therapy: Secondary | ICD-10-CM | POA: Diagnosis not present

## 2018-02-24 DIAGNOSIS — J40 Bronchitis, not specified as acute or chronic: Secondary | ICD-10-CM | POA: Diagnosis not present

## 2018-02-24 DIAGNOSIS — F319 Bipolar disorder, unspecified: Secondary | ICD-10-CM | POA: Diagnosis not present

## 2018-02-24 DIAGNOSIS — F1010 Alcohol abuse, uncomplicated: Secondary | ICD-10-CM | POA: Insufficient documentation

## 2018-02-24 DIAGNOSIS — R0602 Shortness of breath: Secondary | ICD-10-CM | POA: Diagnosis present

## 2018-02-24 LAB — BASIC METABOLIC PANEL
Anion gap: 13 (ref 5–15)
BUN: 5 mg/dL — ABNORMAL LOW (ref 6–20)
CO2: 24 mmol/L (ref 22–32)
Calcium: 8.9 mg/dL (ref 8.9–10.3)
Chloride: 101 mmol/L (ref 98–111)
Creatinine, Ser: 0.62 mg/dL (ref 0.61–1.24)
GFR calc Af Amer: >60 mL/min (ref >60)
GFR calc non Af Amer: >60 mL/min (ref >60)
Glucose, Bld: 198 mg/dL — ABNORMAL HIGH (ref 70–99)
Potassium: 3.3 mmol/L — ABNORMAL LOW (ref 3.5–5.1)
Sodium: 138 mmol/L (ref 135–145)

## 2018-02-24 LAB — CBC
HCT: 44.8 % (ref 39.0–52.0)
Hemoglobin: 15.9 g/dL (ref 13.0–17.0)
MCH: 33.7 pg (ref 26.0–34.0)
MCHC: 35.5 g/dL (ref 30.0–36.0)
MCV: 94.9 fL (ref 80.0–100.0)
Platelets: 253 10*3/uL (ref 150–400)
RBC: 4.72 MIL/uL (ref 4.22–5.81)
RDW: 13.9 % (ref 11.5–15.5)
WBC: 5.2 10*3/uL (ref 4.0–10.5)
nRBC: 0 % (ref 0.0–0.2)

## 2018-02-24 LAB — TROPONIN I: Troponin I: <0.03 ng/mL (ref <0.03)

## 2018-02-24 LAB — GROUP A STREP BY PCR: Group A Strep by PCR: NOT DETECTED

## 2018-02-24 MED ORDER — IPRATROPIUM-ALBUTEROL 0.5-2.5 (3) MG/3ML IN SOLN
6.0000 mL | Freq: Once | RESPIRATORY_TRACT | Status: AC
  Administered 2018-02-24: 6 mL via RESPIRATORY_TRACT
  Filled 2018-02-24: qty 3

## 2018-02-24 MED ORDER — ALBUTEROL SULFATE HFA 108 (90 BASE) MCG/ACT IN AERS: 2.0000 | INHALATION_SPRAY | RESPIRATORY_TRACT | 0 refills | Status: DC | PRN

## 2018-02-24 MED ORDER — PREDNISONE 20 MG PO TABS
60.0000 mg | ORAL_TABLET | Freq: Once | ORAL | Status: AC
  Administered 2018-02-24: 60 mg via ORAL
  Filled 2018-02-24: qty 3

## 2018-02-24 MED ORDER — AZITHROMYCIN 250 MG PO TABS: ORAL_TABLET | ORAL | 0 refills | Status: AC

## 2018-02-24 MED ORDER — IPRATROPIUM-ALBUTEROL 0.5-2.5 (3) MG/3ML IN SOLN
RESPIRATORY_TRACT | Status: AC
  Filled 2018-02-24: qty 3

## 2018-02-24 MED ORDER — PREDNISONE 50 MG PO TABS: ORAL_TABLET | ORAL | 0 refills | Status: DC

## 2018-02-24 MED ORDER — AZITHROMYCIN 500 MG PO TABS
500.0000 mg | ORAL_TABLET | Freq: Once | ORAL | Status: AC
  Administered 2018-02-24: 500 mg via ORAL
  Filled 2018-02-24: qty 1

## 2018-02-24 NOTE — ED Provider Notes (Signed)
[facility] Emergency Department Provider Note  ____________________________________________   First MD Initiated Contact with Patient 02/24/18 1648     (approximate)  I have reviewed the triage vital signs and the nursing notes.   HISTORY  Chief Complaint Cough; Shortness of Breath; and Chest Pain   HPI Victor Long is a 57 y.o. male with a history of depression as well as diverticulitis who was presented to the emergency department with 3 weeks of shortness of breath.  He also states that he is having soreness to the center of his chest when he wakes up in the morning that is worse with coughing.  However, he denies any pain at this time.  Denies any pressure in his ears.  Denies any throat pain.  Denies fever.  Says that he feels like he is trying to cough something up but " Iget nothing out."  Past Medical History:  Diagnosis Date  . Back pain, chronic   . Bipolar disorder (HCC)   . Depression   . Diverticulitis   . GERD (gastroesophageal reflux disease)   . Sciatica     Patient Active Problem List   Diagnosis Date Noted  . Complete tear of left rotator cuff 02/23/2017  . Tendinopathy of left biceps 02/23/2017  . Arthritis of left acromioclavicular joint 02/23/2017  . Bipolar affective disorder, current episode depressed (HCC) 07/01/2016  . Bipolar disorder (HCC) 01/09/2016  . Insomnia 01/09/2016  . Diverticulitis 01/04/2016  . BRBPR (bright red blood per rectum) 01/04/2016  . ETOH abuse 01/04/2016    Past Surgical History:  Procedure Laterality Date  . PILONIDAL CYST DRAINAGE    . SHOULDER ARTHROSCOPY WITH ROTATOR CUFF REPAIR AND SUBACROMIAL DECOMPRESSION Left 03/03/2017   Procedure: LEFT SHOULDER ARTHROSCOPY WITH EXTENSIVE DEBRIDEMENT, ROTATOR CUFF REPAIR, SUBACROMIAL DECOMPRESSION, DISTAL CLAVICLE EXCISION;  Surgeon: Tarry Kos, MD;  Location: [facility];  Service: Orthopedics;  Laterality: Left;  . SHOULDER  SURGERY     cyst removal    Prior to Admission medications   Medication Sig Start Date End Date Taking? Authorizing Provider  naproxen sodium (ALEVE) 220 MG tablet Take 220 mg by mouth.    [provider]  ondansetron (ZOFRAN) 4 MG tablet Take 1-2 tablets (4-8 mg total) by mouth every 8 (eight) hours as needed for nausea or vomiting. 03/03/17   Tarry Kos, MD  oxyCODONE (OXY IR/ROXICODONE) 5 MG immediate release tablet Take 1-3 tablets (5-15 mg total) by mouth every 4 (four) hours as needed. 03/03/17   Tarry Kos, MD  oxyCODONE-acetaminophen (PERCOCET) 5-325 MG tablet Take 1-2 tablets by mouth every 4 (four) hours as needed for severe pain. 03/03/17   Tarry Kos, MD  promethazine (PHENERGAN) 25 MG tablet Take 1 tablet (25 mg total) by mouth every 6 (six) hours as needed for nausea. 03/03/17   Tarry Kos, MD  senna-docusate (SENOKOT S) 8.6-50 MG tablet Take 1 tablet by mouth at bedtime as needed. 03/03/17   Tarry Kos, MD  tiZANidine (ZANAFLEX) 4 MG tablet Take 1 tablet (4 mg total) by mouth every 6 (six) hours as needed for muscle spasms. 03/03/17   Tarry Kos, MD  traMADol (ULTRAM) 50 MG tablet Take 1-2 tablets (50-100 mg total) 3 (three) times daily as needed by mouth. 02/23/17   Tarry Kos, MD  propranolol ER (INDERAL LA) 60 MG 24 hr capsule Take 1 capsule (60 mg total) by mouth daily. Patient not taking: Reported on 07/24/2014 05/08/14  05/30/15  Roxy HorsemanBrowning, Robert, PA-C    Allergies Other  Family History  Problem Relation Age of Onset  . Cancer Father     Social History Social History   Tobacco Use  . Smoking status: Never Smoker  . Smokeless tobacco: Current User    Types: Chew  Substance Use Topics  . Alcohol use: Yes    Alcohol/week: 6.0 - 12.0 standard drinks    Types: 6 - 12 Cans of beer per week    Comment: daily 6-7 beers  . Drug use: Yes    Types: Marijuana    Comment: not for 28 years    Review of Systems  Constitutional: No  fever/chills Eyes: No visual changes. ENT: No sore throat. Cardiovascular: As above Respiratory: As above Gastrointestinal: No abdominal pain.  No nausea, no vomiting.  No diarrhea.  No constipation. Genitourinary: Negative for dysuria. Musculoskeletal: Negative for back pain. Skin: Negative for rash. Neurological: Negative for headaches, focal weakness or numbness.   ____________________________________________   PHYSICAL EXAM:  VITAL SIGNS: ED Triage Vitals [02/24/18 1544]  Enc Vitals Group     BP 120/86     Pulse Rate 100     Resp 20     Temp 98.2 F (36.8 C)     Temp Source Oral     SpO2 97 %     Weight 138 lb (62.6 kg)     Height 5' 4.5" (1.638 m)     Head Circumference      Peak Flow      Pain Score 3     Pain Loc      Pain Edu?      Excl. in GC?     Constitutional: Alert and oriented. Well appearing and in no acute distress. Eyes: Conjunctivae are normal.  Head: Atraumatic. Nose: No congestion/rhinnorhea. Mouth/Throat: Mucous membranes are moist.  Small amount of exudate overlying the left tonsil.  No tonsillar swelling.  No pharyngeal erythema. Neck: No stridor.   Cardiovascular: Normal rate, regular rhythm. Grossly normal heart sounds.  Good peripheral circulation. Respiratory: Normal respiratory effort.  No retractions.  Mildly prolonged expiratory phase. Gastrointestinal: Soft and nontender. No distention.  Musculoskeletal: No lower extremity tenderness nor edema.  No joint effusions. Neurologic:  Normal speech and language. No gross focal neurologic deficits are appreciated. Skin:  Skin is warm, dry and intact. No rash noted. Psychiatric: Mood and affect are normal. Speech and behavior are normal.  ____________________________________________   LABS (all labs ordered are listed, but only abnormal results are displayed)  Labs Reviewed  BASIC METABOLIC PANEL - Abnormal; Notable for the following components:      Result Value   Potassium 3.3 (*)     Glucose, Bld 198 (*)    BUN 5 (*)    All other components within normal limits  GROUP A STREP BY PCR  CBC  TROPONIN I   ____________________________________________  EKG  ED ECG REPORT I, Arelia Longestavid M , the attending physician, personally viewed and interpreted this ECG.   Date: 02/24/2018  EKG Time: 1606  Rate: 67  Rhythm: normal sinus rhythm  Axis: Normal  Intervals:none  ST&T Change: No ST segment elevation or depression.  No abnormal T wave inversion.  ____________________________________________  RADIOLOGY  No acute finding on chest x-ray. ____________________________________________   PROCEDURES  Procedure(s) performed:   Procedures  Critical Care performed:   ____________________________________________   INITIAL IMPRESSION / ASSESSMENT AND PLAN / ED COURSE  Pertinent labs & imaging results that were  available during my care of the patient were reviewed by me and considered in my medical decision making (see chart for details).  Differential includes, but is not limited to, viral syndrome, bronchitis including COPD exacerbation, pneumonia, reactive airway disease including asthma, CHF including exacerbation with or without pulmonary/interstitial edema, pneumothorax, ACS, thoracic trauma, and pulmonary embolism. As part of my medical decision making, I reviewed the following data within the electronic MEDICAL RECORD NUMBER Notes from prior ED visits  ----------------------------------------- 7:15 PM on 02/24/2018 -----------------------------------------  Patient symptomatically improved.  Says that he is breathing much better at this time.  Re-auscultated his lungs and is clear to auscultation throughout without any prolonged expiratory phase.  Likely bronchitis.  Because of the duration of the symptoms I will prescribe antibiotics to the patient.  Will be discharged at this time. ____________________________________________   FINAL CLINICAL  IMPRESSION(S) / ED DIAGNOSES  Bronchitis.  NEW MEDICATIONS STARTED DURING THIS VISIT:  New Prescriptions   No medications on file     Note:  This document was prepared using Dragon voice recognition software and may include unintentional dictation errors.     Myrna Blazer, MD 02/24/18 [phone]

## 2018-02-24 NOTE — ED Triage Notes (Signed)
Pt arrived with complaints of chest tightness, shortness of breath, and non productive cough that started 1 week prior. Pt's breathing equal and unlabored. Pt's room air saturation 97%.

## 2018-02-25 IMAGING — CT CT ABD-PELV W/ CM
2 of 5 series · 15 of 46 positions shown, 17 images · IV contrast (iopamidol)
Comparison: None.

CLINICAL DATA: Chronic intermittent abdominal pain, rectal bleeding
for 2 weeks. History of colonoscopy and back surgery.

EXAM:
CT ABDOMEN AND PELVIS WITH CONTRAST
TECHNIQUE: Multidetector CT imaging of the abdomen and pelvis was performed
using the standard protocol following bolus administration of
intravenous contrast.
CONTRAST:  1 A02AB7-N00 IOPAMIDOL (A02AB7-N00) INJECTION 61%

[Series 2: abd/ pelvis 5.0 i30f 1 · axial · 0.73mm/px · z∈[+581,+1101]mm · 12 of 118 slices shown, 14 images]
[im 7/118  soft-tissue]
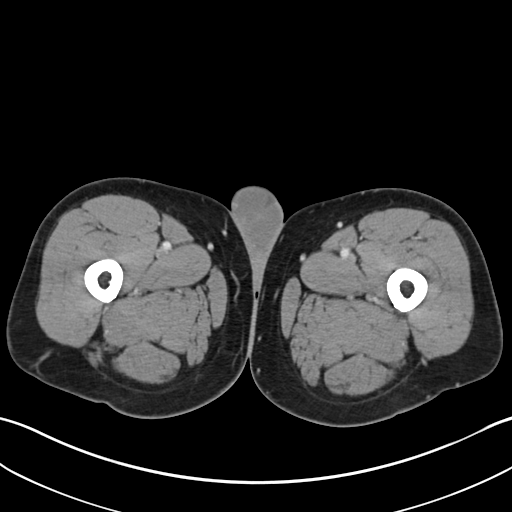
[im 7/118  bone]
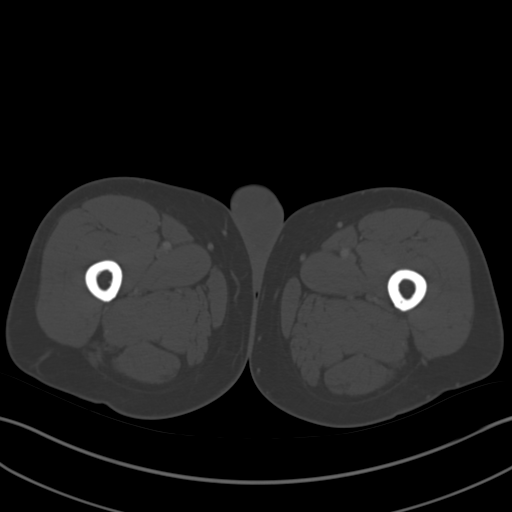
[im 19/118  soft-tissue]
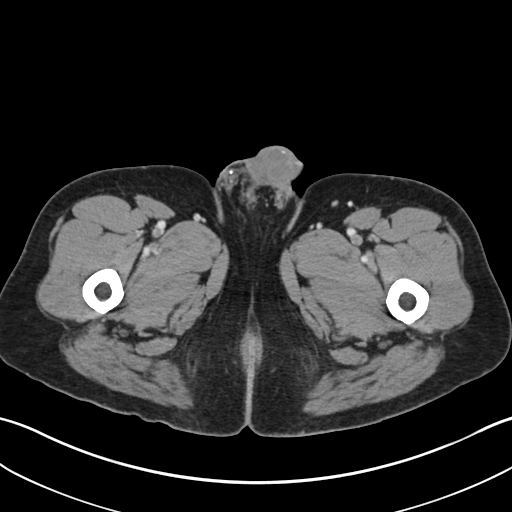
[im 25/118  soft-tissue]
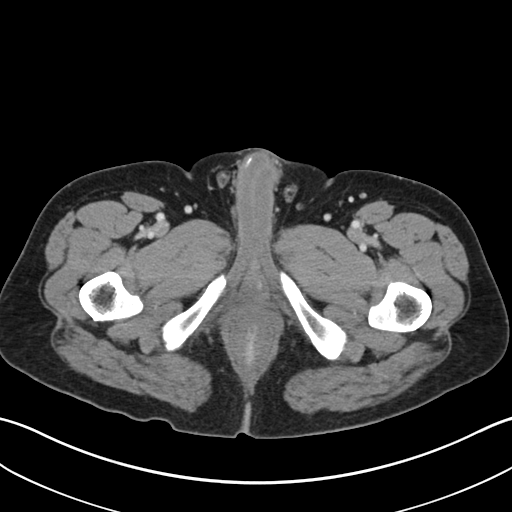
[im 37/118  soft-tissue]
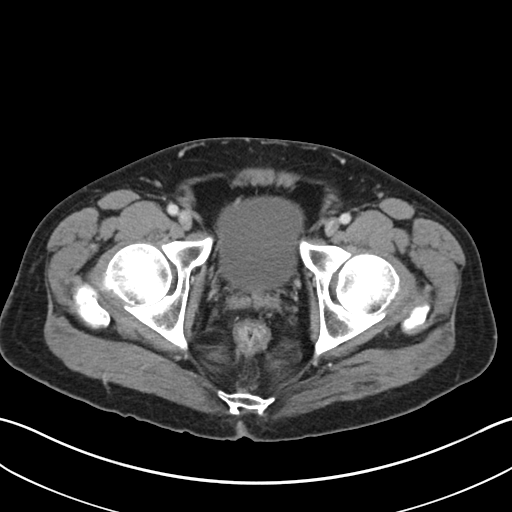
[im 44/118  soft-tissue]
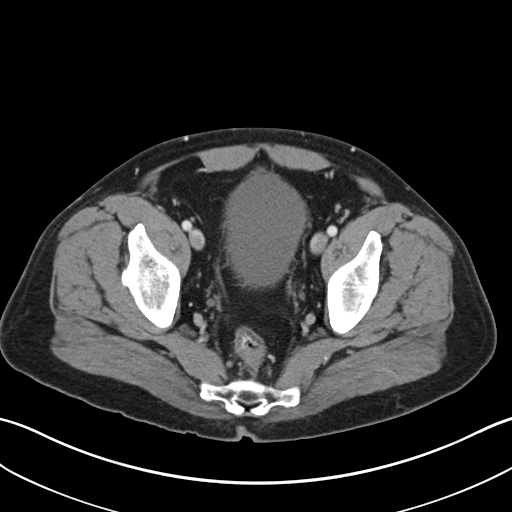
[im 56/118  soft-tissue]
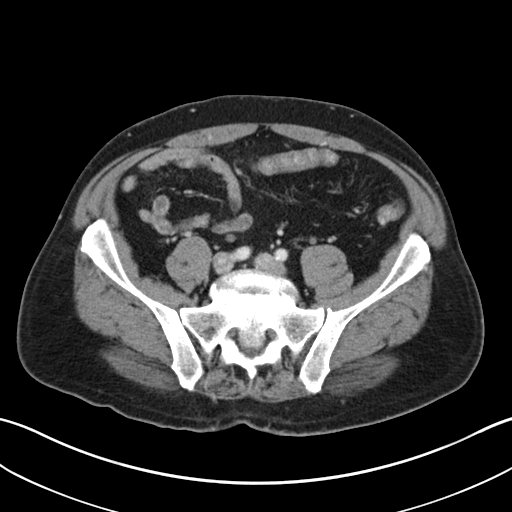
[im 62/118  soft-tissue]
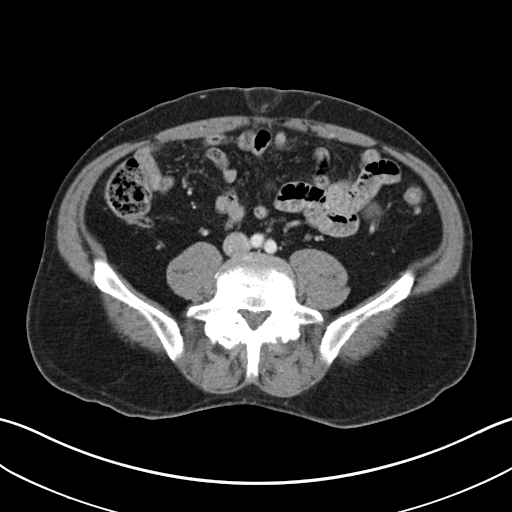
[im 74/118  soft-tissue]
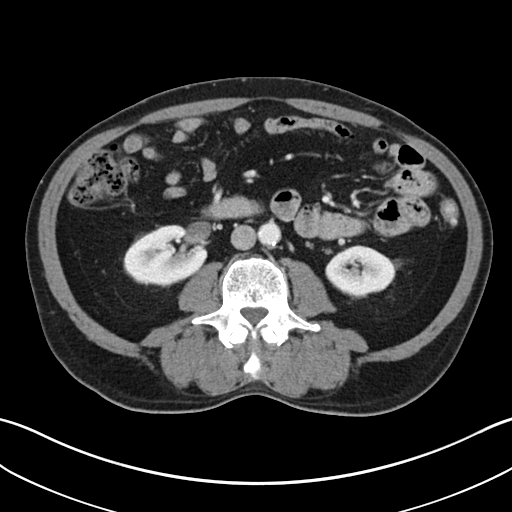
[im 81/118  soft-tissue]
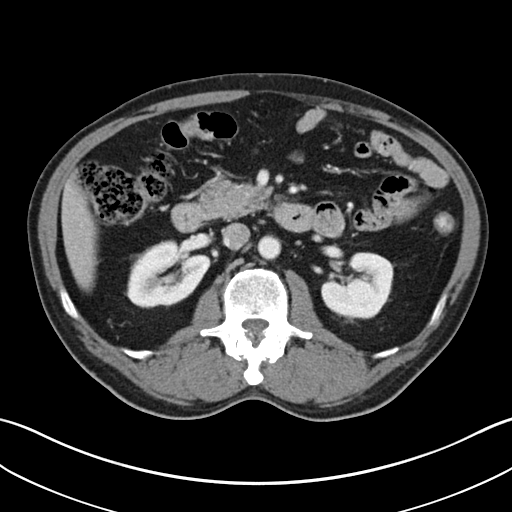
[im 81/118  bone]
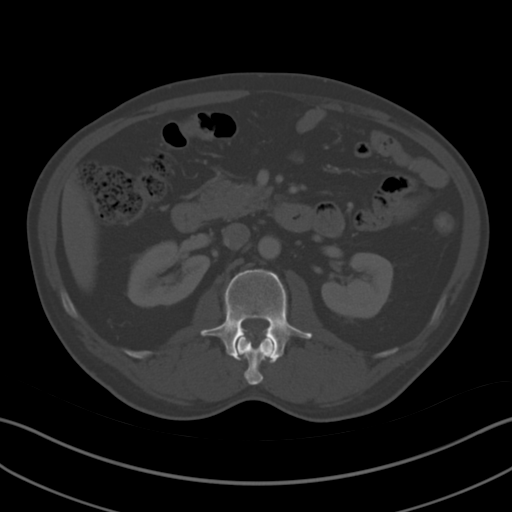
[im 93/118  soft-tissue]
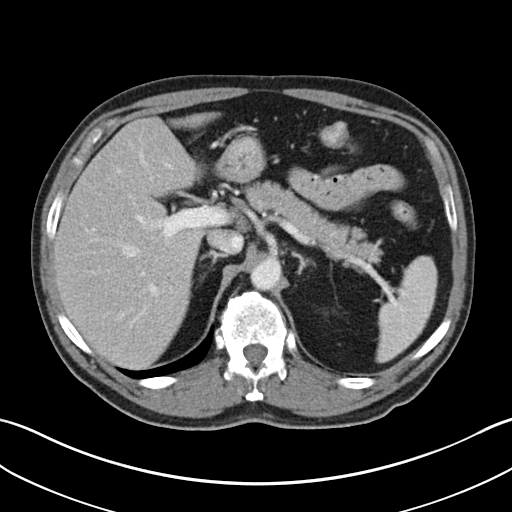
[im 99/118  soft-tissue]
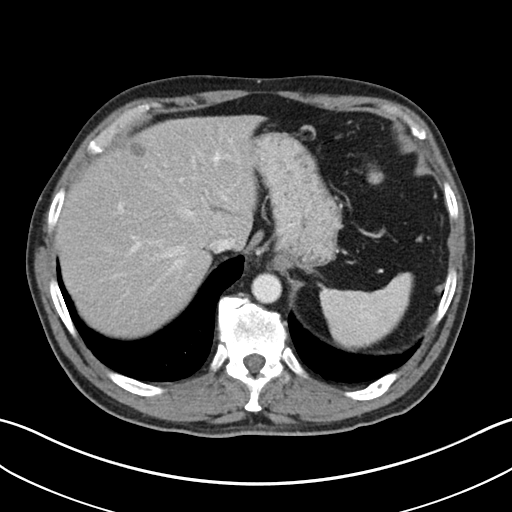
[im 111/118  soft-tissue]
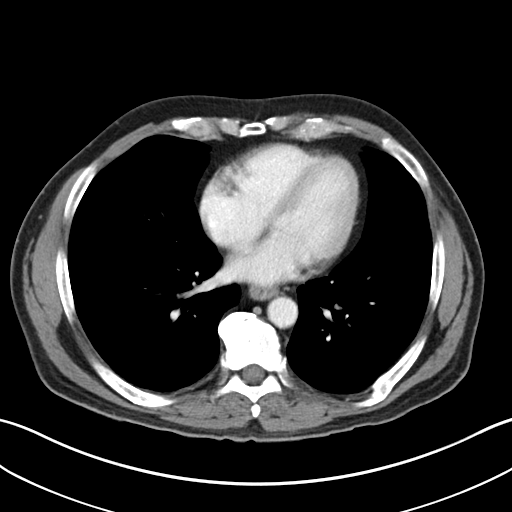

[Series 5: coronal soft tissue · coronal · 0.73mm/px · 3 of 82 slices shown]
[im 28/82  soft-tissue]
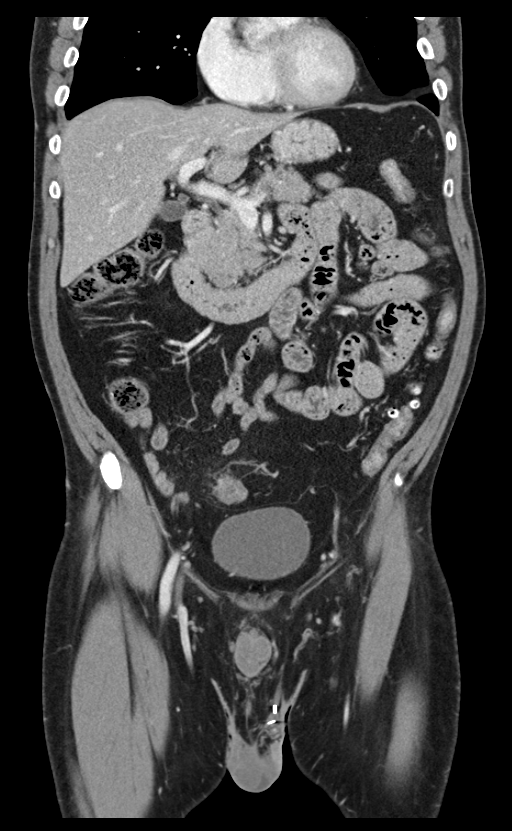
[im 37/82  soft-tissue]
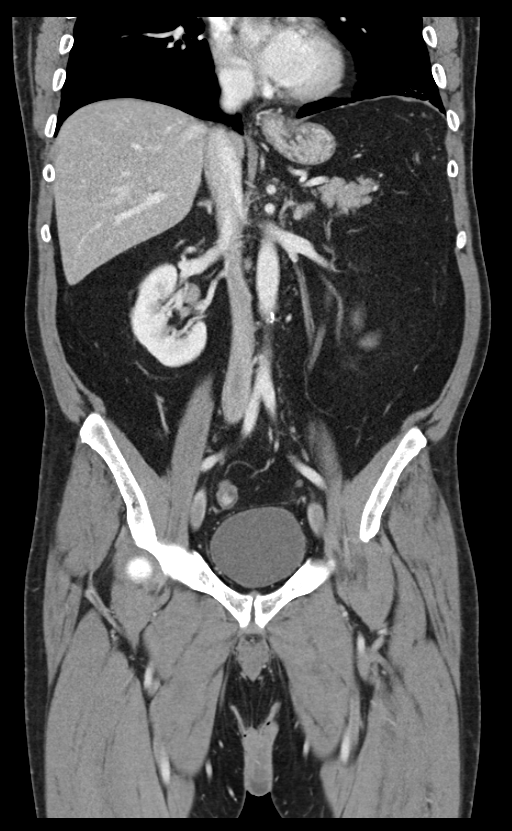
[im 46/82  soft-tissue]
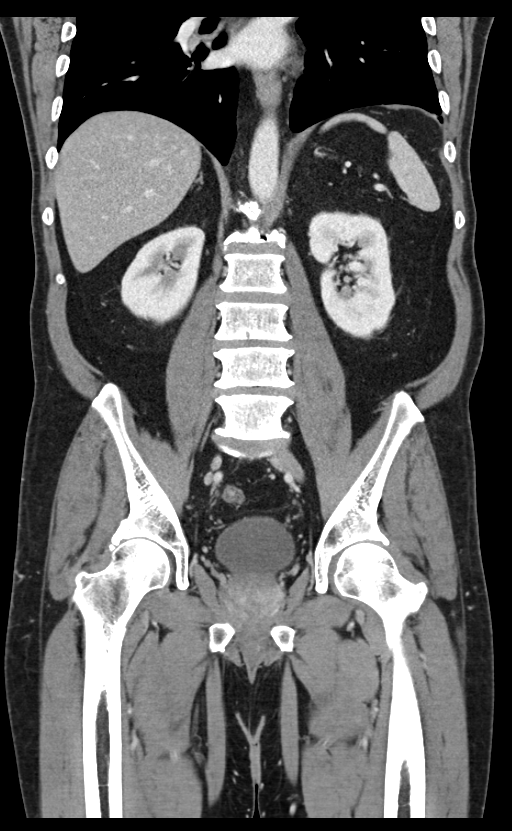

[15 of 46 positions shown; findings below may reference images not displayed]

FINDINGS: LOWER CHEST: Lung bases are clear. Included heart size is normal. No
pericardial effusion.

HEPATOBILIARY: Liver and gallbladder are normal.

PANCREAS: Normal.

SPLEEN: Normal.

ADRENALS/URINARY TRACT: Kidneys are orthotopic, demonstrating
symmetric enhancement. No nephrolithiasis, hydronephrosis or solid
renal masses. The unopacified ureters are normal in course and
caliber. Delayed imaging through the kidneys demonstrates symmetric
prompt contrast excretion within the proximal urinary collecting
system. Urinary bladder is well distended and unremarkable. Normal
adrenal glands.

STOMACH/BOWEL: The stomach, small and large bowel are normal in
course and caliber without inflammatory changes. Mild rectal wall
thickening without inflammation can be seen with internal
hemorrhoids. Moderate descending and sigmoid colonic diverticulosis.
Short segment of minimal sigmoid pericolonic fat stranding (coronal
25/82).

VASCULAR/LYMPHATIC: Aortoiliac vessels are normal in course and
caliber, mild calcific atherosclerosis. No lymphadenopathy by CT
size criteria.

REPRODUCTIVE: Prostate size is normal.  Penile calcifications.

OTHER: No intraperitoneal free fluid or free air.

MUSCULOSKELETAL: Nonacute. Small fat containing umbilical hernia.
Scrotal sac surgical clips. Severe L5-S1 degenerative disc, moderate
at L4-5. Severe L5-S1 neural foraminal narrowing. Moderate fat
containing umbilical hernia.
IMPRESSION: Moderate diverticulosis with short segment of mild likely acute
sigmoid diverticulitis without complication.

## 2019-01-25 ENCOUNTER — Other Ambulatory Visit: Payer: Self-pay

## 2019-01-25 ENCOUNTER — Emergency Department
Admission: EM | Admit: 2019-01-25 | Discharge: 2019-01-25 | Disposition: A | Discharge status: Home / Self Care | Payer: Medicaid Other | Attending: Student in an Organized Health Care Education/Training Program | Admitting: Student in an Organized Health Care Education/Training Program

## 2019-01-25 ENCOUNTER — Emergency Department: Payer: Medicaid Other

## 2019-01-25 DIAGNOSIS — Y929 Unspecified place or not applicable: Secondary | ICD-10-CM | POA: Insufficient documentation

## 2019-01-25 DIAGNOSIS — Y9389 Activity, other specified: Secondary | ICD-10-CM | POA: Diagnosis not present

## 2019-01-25 DIAGNOSIS — Y999 Unspecified external cause status: Secondary | ICD-10-CM | POA: Insufficient documentation

## 2019-01-25 DIAGNOSIS — W19XXXA Unspecified fall, initial encounter: Secondary | ICD-10-CM

## 2019-01-25 DIAGNOSIS — M25561 Pain in right knee: Secondary | ICD-10-CM | POA: Diagnosis present

## 2019-01-25 DIAGNOSIS — M25511 Pain in right shoulder: Secondary | ICD-10-CM | POA: Insufficient documentation

## 2019-01-25 DIAGNOSIS — S8990XA Unspecified injury of unspecified lower leg, initial encounter: Secondary | ICD-10-CM

## 2019-01-25 DIAGNOSIS — M25461 Effusion, right knee: Secondary | ICD-10-CM | POA: Insufficient documentation

## 2019-01-25 DIAGNOSIS — F17220 Nicotine dependence, chewing tobacco, uncomplicated: Secondary | ICD-10-CM | POA: Insufficient documentation

## 2019-01-25 MED ORDER — MELOXICAM 15 MG PO TABS: 15.0000 mg | ORAL_TABLET | Freq: Every day | ORAL | 1 refills | Status: AC

## 2019-01-25 MED ORDER — CEPHALEXIN 500 MG PO CAPS: 500.0000 mg | ORAL_CAPSULE | Freq: Three times a day (TID) | ORAL | 0 refills | Status: DC

## 2019-01-25 MED ORDER — MELOXICAM 15 MG PO TABS: 15.0000 mg | ORAL_TABLET | Freq: Every day | ORAL | 1 refills | Status: DC

## 2019-01-25 MED ORDER — LIDOCAINE HCL 1 % IJ SOLN
5.0000 mL | Freq: Once | INTRAMUSCULAR | Status: DC
  Filled 2019-01-25: qty 10

## 2019-01-25 MED ORDER — SULFAMETHOXAZOLE-TRIMETHOPRIM 800-160 MG PO TABS: 1.0000 | ORAL_TABLET | Freq: Two times a day (BID) | ORAL | 0 refills | Status: DC

## 2019-01-25 MED ORDER — SULFAMETHOXAZOLE-TRIMETHOPRIM 800-160 MG PO TABS: 1.0000 | ORAL_TABLET | Freq: Two times a day (BID) | ORAL | 0 refills | Status: AC
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
## 2019-07-20 DIAGNOSIS — K631 Perforation of intestine (nontraumatic): Secondary | ICD-10-CM

## 2019-07-20 DIAGNOSIS — F1010 Alcohol abuse, uncomplicated: Secondary | ICD-10-CM

## 2019-07-20 DIAGNOSIS — Z932 Ileostomy status: Secondary | ICD-10-CM

## 2019-07-21 DIAGNOSIS — K631 Perforation of intestine (nontraumatic): Secondary | ICD-10-CM | POA: Diagnosis not present

## 2019-07-21 DIAGNOSIS — Z932 Ileostomy status: Secondary | ICD-10-CM | POA: Diagnosis not present

## 2019-07-21 DIAGNOSIS — F1010 Alcohol abuse, uncomplicated: Secondary | ICD-10-CM | POA: Diagnosis not present

## 2019-07-22 DIAGNOSIS — F1010 Alcohol abuse, uncomplicated: Secondary | ICD-10-CM | POA: Diagnosis not present

## 2019-07-22 DIAGNOSIS — K631 Perforation of intestine (nontraumatic): Secondary | ICD-10-CM | POA: Diagnosis not present

## 2019-07-22 DIAGNOSIS — Z932 Ileostomy status: Secondary | ICD-10-CM | POA: Diagnosis not present

## 2019-07-23 DIAGNOSIS — F1010 Alcohol abuse, uncomplicated: Secondary | ICD-10-CM | POA: Diagnosis not present

## 2019-07-23 DIAGNOSIS — K631 Perforation of intestine (nontraumatic): Secondary | ICD-10-CM | POA: Diagnosis not present

## 2019-07-23 DIAGNOSIS — Z932 Ileostomy status: Secondary | ICD-10-CM | POA: Diagnosis not present

## 2019-07-24 DIAGNOSIS — F1010 Alcohol abuse, uncomplicated: Secondary | ICD-10-CM | POA: Diagnosis not present

## 2019-07-24 DIAGNOSIS — K631 Perforation of intestine (nontraumatic): Secondary | ICD-10-CM | POA: Diagnosis not present

## 2019-07-24 DIAGNOSIS — Z932 Ileostomy status: Secondary | ICD-10-CM | POA: Diagnosis not present

## 2019-07-25 DIAGNOSIS — K631 Perforation of intestine (nontraumatic): Secondary | ICD-10-CM | POA: Diagnosis not present

## 2019-07-25 DIAGNOSIS — F1010 Alcohol abuse, uncomplicated: Secondary | ICD-10-CM | POA: Diagnosis not present

## 2019-07-25 DIAGNOSIS — Z932 Ileostomy status: Secondary | ICD-10-CM | POA: Diagnosis not present

## 2019-07-26 DIAGNOSIS — Z932 Ileostomy status: Secondary | ICD-10-CM | POA: Diagnosis not present

## 2019-07-26 DIAGNOSIS — K631 Perforation of intestine (nontraumatic): Secondary | ICD-10-CM | POA: Diagnosis not present

## 2019-07-26 DIAGNOSIS — F1010 Alcohol abuse, uncomplicated: Secondary | ICD-10-CM | POA: Diagnosis not present

## 2019-07-27 DIAGNOSIS — K631 Perforation of intestine (nontraumatic): Secondary | ICD-10-CM | POA: Diagnosis not present

## 2019-07-27 DIAGNOSIS — Z932 Ileostomy status: Secondary | ICD-10-CM | POA: Diagnosis not present

## 2019-07-27 DIAGNOSIS — F1010 Alcohol abuse, uncomplicated: Secondary | ICD-10-CM | POA: Diagnosis not present

## 2019-08-15 DIAGNOSIS — R109 Unspecified abdominal pain: Secondary | ICD-10-CM

## 2019-08-16 DIAGNOSIS — R109 Unspecified abdominal pain: Secondary | ICD-10-CM | POA: Diagnosis not present

## 2019-08-17 DIAGNOSIS — R109 Unspecified abdominal pain: Secondary | ICD-10-CM | POA: Diagnosis not present

## 2019-08-18 DIAGNOSIS — R109 Unspecified abdominal pain: Secondary | ICD-10-CM | POA: Diagnosis not present

## 2019-08-19 DIAGNOSIS — R109 Unspecified abdominal pain: Secondary | ICD-10-CM | POA: Diagnosis not present

## 2019-08-20 DIAGNOSIS — R109 Unspecified abdominal pain: Secondary | ICD-10-CM | POA: Diagnosis not present

## 2019-08-21 DIAGNOSIS — R109 Unspecified abdominal pain: Secondary | ICD-10-CM | POA: Diagnosis not present

## 2019-08-22 DIAGNOSIS — R109 Unspecified abdominal pain: Secondary | ICD-10-CM | POA: Diagnosis not present

## 2019-08-23 DIAGNOSIS — R109 Unspecified abdominal pain: Secondary | ICD-10-CM | POA: Diagnosis not present

## 2019-08-24 DIAGNOSIS — R109 Unspecified abdominal pain: Secondary | ICD-10-CM | POA: Diagnosis not present

## 2019-08-25 DIAGNOSIS — R109 Unspecified abdominal pain: Secondary | ICD-10-CM | POA: Diagnosis not present

## 2019-08-26 DIAGNOSIS — R109 Unspecified abdominal pain: Secondary | ICD-10-CM | POA: Diagnosis not present

## 2019-08-27 DIAGNOSIS — R109 Unspecified abdominal pain: Secondary | ICD-10-CM | POA: Diagnosis not present

## 2019-08-28 DIAGNOSIS — R109 Unspecified abdominal pain: Secondary | ICD-10-CM | POA: Diagnosis not present

## 2019-12-18 ENCOUNTER — Emergency Department
Admission: EM | Admit: 2019-12-18 | Discharge: 2019-12-18 | Disposition: A | Discharge status: Home / Self Care | Payer: Medicaid Other | Attending: Emergency Medicine | Admitting: Emergency Medicine

## 2019-12-18 ENCOUNTER — Other Ambulatory Visit: Payer: Self-pay

## 2019-12-18 ENCOUNTER — Emergency Department: Payer: Medicaid Other

## 2019-12-18 DIAGNOSIS — Y908 Blood alcohol level of 240 mg/100 ml or more: Secondary | ICD-10-CM | POA: Diagnosis not present

## 2019-12-18 DIAGNOSIS — Z79899 Other long term (current) drug therapy: Secondary | ICD-10-CM | POA: Insufficient documentation

## 2019-12-18 DIAGNOSIS — R569 Unspecified convulsions: Secondary | ICD-10-CM | POA: Diagnosis present

## 2019-12-18 DIAGNOSIS — F1010 Alcohol abuse, uncomplicated: Secondary | ICD-10-CM

## 2019-12-18 DIAGNOSIS — R251 Tremor, unspecified: Secondary | ICD-10-CM

## 2019-12-18 LAB — BASIC METABOLIC PANEL
Anion gap: 12 (ref 5–15)
BUN: 7 mg/dL (ref 6–20)
CO2: 25 mmol/L (ref 22–32)
Calcium: 8.9 mg/dL (ref 8.9–10.3)
Chloride: 99 mmol/L (ref 98–111)
Creatinine, Ser: 0.47 mg/dL — ABNORMAL LOW (ref 0.61–1.24)
GFR calc Af Amer: >60 mL/min (ref >60)
GFR calc non Af Amer: >60 mL/min (ref >60)
Glucose, Bld: 105 mg/dL — ABNORMAL HIGH (ref 70–99)
Potassium: 4 mmol/L (ref 3.5–5.1)
Sodium: 136 mmol/L (ref 135–145)

## 2019-12-18 LAB — CBC
HCT: 42 % (ref 39.0–52.0)
Hemoglobin: 14.3 g/dL (ref 13.0–17.0)
MCH: 31.6 pg (ref 26.0–34.0)
MCHC: 34 g/dL (ref 30.0–36.0)
MCV: 92.7 fL (ref 80.0–100.0)
Platelets: 182 10*3/uL (ref 150–400)
RBC: 4.53 MIL/uL (ref 4.22–5.81)
RDW: 13.5 % (ref 11.5–15.5)
WBC: 4.7 10*3/uL (ref 4.0–10.5)
nRBC: 0 % (ref 0.0–0.2)

## 2019-12-18 LAB — ETHANOL: Alcohol, Ethyl (B): 222 mg/dL — ABNORMAL HIGH (ref <10)

## 2019-12-18 LAB — MAGNESIUM: Magnesium: 1.9 mg/dL (ref 1.7–2.4)

## 2019-12-18 MED ORDER — CHLORDIAZEPOXIDE HCL 25 MG PO CAPS: ORAL_CAPSULE | ORAL | 0 refills | Status: DC

## 2019-12-18 MED ORDER — CHLORDIAZEPOXIDE HCL 25 MG PO CAPS: ORAL_CAPSULE | ORAL | 0 refills | Status: AC

## 2019-12-18 NOTE — ED Notes (Signed)
Rainbow sent to the lab.  
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
## 2019-12-18 NOTE — ED Notes (Signed)
Pt reports approx 30 min ago walking to [employer] and having a drink before returning to ER.

## 2019-12-18 NOTE — ED Provider Notes (Signed)
[facility] Emergency Department Provider Note   ____________________________________________   First MD Initiated Contact with Patient 12/18/19 2016     (approximate)  I have reviewed the triage vital signs and the nursing notes.   HISTORY  Chief Complaint Seizures    HPI Victor Long is a 59 y.o. male with past medical history of bipolar disorder, alcohol abuse, and GERD who presents to the ED complaining of seizure-like activity.  Patient reports over the past month and a half he has 8 separate episodes that he is concerned could have been seizures.  He states he will suddenly start shaking across his entire body uncontrollably and have to stop and either sit down or lean against something.  He states he does not lose consciousness, episodes can then last for as long as 2 and half hours before resolving.  Afterwards he will feel very tired, but he denies any confusion or disorientation.  He has not had any vision changes, numbness, or weakness recently.  He does admit that he is a daily drinker of about 12 beers, had a few drinks before coming to the ED and then states that due to the long wait he had to step out to have a couple more beers to drink.  He is concerned that his alcohol intake could be contributing to these episodes.        Past Medical History:  Diagnosis Date  . Back pain, chronic   . Bipolar disorder (HCC)   . Depression   . Diverticulitis   . GERD (gastroesophageal reflux disease)   . Sciatica     Patient Active Problem List   Diagnosis Date Noted  . Complete tear of left rotator cuff 02/23/2017  . Tendinopathy of left biceps 02/23/2017  . Arthritis of left acromioclavicular joint 02/23/2017  . Bipolar affective disorder, current episode depressed (HCC) 07/01/2016  . Bipolar disorder (HCC) 01/09/2016  . Insomnia 01/09/2016  . Diverticulitis 01/04/2016  . BRBPR (bright red blood per rectum) 01/04/2016  . ETOH abuse  01/04/2016    Past Surgical History:  Procedure Laterality Date  . PILONIDAL CYST DRAINAGE    . SHOULDER ARTHROSCOPY WITH ROTATOR CUFF REPAIR AND SUBACROMIAL DECOMPRESSION Left 03/03/2017   Procedure: LEFT SHOULDER ARTHROSCOPY WITH EXTENSIVE DEBRIDEMENT, ROTATOR CUFF REPAIR, SUBACROMIAL DECOMPRESSION, DISTAL CLAVICLE EXCISION;  Surgeon: Tarry Kos, MD;  Location: [facility];  Service: Orthopedics;  Laterality: Left;  . SHOULDER SURGERY     cyst removal    Prior to Admission medications   Medication Sig Start Date End Date Taking? Authorizing Provider  albuterol (PROVENTIL HFA;VENTOLIN HFA) 108 (90 Base) MCG/ACT inhaler Inhale 2 puffs into the lungs every 4 (four) hours as needed for wheezing or shortness of breath. 02/24/18   Schaevitz, Myra Rude, MD  chlordiazePOXIDE (LIBRIUM) 25 MG capsule Take 1 capsule (25 mg total) by mouth 5 (five) times daily for 1 day, THEN 1 capsule (25 mg total) 4 (four) times daily for 1 day, THEN 1 capsule (25 mg total) 3 (three) times daily for 1 day, THEN 1 capsule (25 mg total) 2 (two) times daily for 1 day, THEN 1 capsule (25 mg total) daily for 1 day. 12/18/19 12/23/19  Chesley Noon, MD  naproxen sodium (ALEVE) 220 MG tablet Take 220 mg by mouth.    [provider]  ondansetron (ZOFRAN) 4 MG tablet Take 1-2 tablets (4-8 mg total) by mouth every 8 (eight) hours as needed for nausea or vomiting. Patient not taking:  Reported on 01/25/2019 03/03/17   Tarry Kos, MD  oxyCODONE (OXY IR/ROXICODONE) 5 MG immediate release tablet Take 1-3 tablets (5-15 mg total) by mouth every 4 (four) hours as needed. Patient not taking: Reported on 01/25/2019 03/03/17   Tarry Kos, MD  oxyCODONE-acetaminophen (PERCOCET) 5-325 MG tablet Take 1-2 tablets by mouth every 4 (four) hours as needed for severe pain. Patient not taking: Reported on 01/25/2019 03/03/17   Tarry Kos, MD  predniSONE (DELTASONE) 50 MG tablet Take 1 tab, PO daily x5  days Patient not taking: Reported on 01/25/2019 02/24/18   Myrna Blazer, MD  promethazine (PHENERGAN) 25 MG tablet Take 1 tablet (25 mg total) by mouth every 6 (six) hours as needed for nausea. Patient not taking: Reported on 01/25/2019 03/03/17   Tarry Kos, MD  senna-docusate (SENOKOT S) 8.6-50 MG tablet Take 1 tablet by mouth at bedtime as needed. Patient not taking: Reported on 01/25/2019 03/03/17   Tarry Kos, MD  tiZANidine (ZANAFLEX) 4 MG tablet Take 1 tablet (4 mg total) by mouth every 6 (six) hours as needed for muscle spasms. Patient not taking: Reported on 01/25/2019 03/03/17   Tarry Kos, MD  traMADol (ULTRAM) 50 MG tablet Take 1-2 tablets (50-100 mg total) 3 (three) times daily as needed by mouth. Patient not taking: Reported on 01/25/2019 02/23/17   Tarry Kos, MD    Allergies Other  Family History  Problem Relation Age of Onset  . Cancer Father     Social History Social History   Tobacco Use  . Smoking status: Never Smoker  . Smokeless tobacco: Current User    Types: Chew  Substance Use Topics  . Alcohol use: Yes    Alcohol/week: 6.0 - 12.0 standard drinks    Types: 6 - 12 Cans of beer per week    Comment: daily 6-7 beers  . Drug use: Yes    Types: Marijuana    Comment: not for 28 years    Review of Systems  Constitutional: No fever/chills Eyes: No visual changes. ENT: No sore throat. Cardiovascular: Denies chest pain. Respiratory: Denies shortness of breath. Gastrointestinal: No abdominal pain.  No nausea, no vomiting.  No diarrhea.  No constipation. Genitourinary: Negative for dysuria. Musculoskeletal: Negative for back pain. Skin: Negative for rash. Neurological: Negative for headaches, focal weakness or numbness.  Positive for seizure-like activity.  ____________________________________________   PHYSICAL EXAM:  VITAL SIGNS: ED Triage Vitals  Enc Vitals Group     BP 12/18/19 1307 (!) 133/105     Pulse Rate 12/18/19  1307 100     Resp 12/18/19 1307 18     Temp 12/18/19 1307 98.3 F (36.8 C)     Temp Source 12/18/19 1307 Oral     SpO2 12/18/19 1307 100 %     Weight 12/18/19 1308 140 lb (63.5 kg)     Height 12/18/19 1308 5\' 4"  (1.626 m)     Head Circumference --      Peak Flow --      Pain Score 12/18/19 1307 0     Pain Loc --      Pain Edu? --      Excl. in GC? --     Constitutional: Alert and oriented. Eyes: Conjunctivae are normal. Head: Atraumatic. Nose: No congestion/rhinnorhea. Mouth/Throat: Mucous membranes are moist. Neck: Normal ROM Cardiovascular: Normal rate, regular rhythm. Grossly normal heart sounds. Respiratory: Normal respiratory effort.  No retractions. Lungs CTAB. Gastrointestinal: Soft and nontender. No  distention. Genitourinary: deferred Musculoskeletal: No lower extremity tenderness nor edema. Neurologic:  Normal speech and language. No gross focal neurologic deficits are appreciated.  No tremors noted. Skin:  Skin is warm, dry and intact. No rash noted. Psychiatric: Mood and affect are normal. Speech and behavior are normal.  ____________________________________________   LABS (all labs ordered are listed, but only abnormal results are displayed)  Labs Reviewed  BASIC METABOLIC PANEL - Abnormal; Notable for the following components:      Result Value   Glucose, Bld 105 (*)    Creatinine, Ser 0.47 (*)    All other components within normal limits  ETHANOL - Abnormal; Notable for the following components:   Alcohol, Ethyl (B) 222 (*)    All other components within normal limits  CBC  MAGNESIUM   ____________________________________________  EKG  ED ECG REPORT I, Chesley Noon, the attending physician, personally viewed and interpreted this ECG.   Date: 12/18/2019  EKG Time: 20:24  Rate: 94  Rhythm: normal sinus rhythm  Axis: Normal  Intervals:none  ST&T Change: None   PROCEDURES  Procedure(s) performed (including Critical  Care):  Procedures   ____________________________________________   INITIAL IMPRESSION / ASSESSMENT AND PLAN / ED COURSE       59 year old male with past medical history of bipolar disorder, alcohol abuse, and GERD who presents to the ED with recurrent shaking episodes over the past month and a half that he is concerned could be seizures.  It seems very unlikely that these episodes are true seizures as symptoms across the midline but he does not lose consciousness and does not have any postictal state.  He is awake and alert here in the ED with no focal neurologic deficits and no evidence of acute alcohol withdrawal.  Lab work is unremarkable as well as EKG.  We will screen CT head and if this is unremarkable patient will be appropriate for discharge home.  It is possible that his regular alcohol consumption is contributing to these episodes and patient is requesting assistance with outpatient detox.  CT head is negative for acute process.  No seizure activity noted here in the ED.  Patient is appropriate for discharge home and will provided with prescription for Librium for alcohol withdrawal.  He was also provided with outpatient detox resources, was counseled to return to the ED for new worsening symptoms and otherwise follow-up with PCP and neurology.  Patient agrees with plan.      ____________________________________________   FINAL CLINICAL IMPRESSION(S) / ED DIAGNOSES  Final diagnoses:  Tremor  Seizure-like activity (HCC)  Alcohol abuse     ED Discharge Orders         Ordered    chlordiazePOXIDE (LIBRIUM) 25 MG capsule        12/18/19 2222           Note:  This document was prepared using Dragon voice recognition software and may include unintentional dictation errors.   Chesley Noon, MD 12/18/19 2223

## 2020-01-23 ENCOUNTER — Emergency Department
Admission: EM | Admit: 2020-01-23 | Discharge: 2020-01-24 | Disposition: A | Discharge status: Home / Self Care | Payer: Medicaid Other | Attending: Emergency Medicine | Admitting: Emergency Medicine

## 2020-01-23 ENCOUNTER — Other Ambulatory Visit: Payer: Self-pay

## 2020-01-23 DIAGNOSIS — F1010 Alcohol abuse, uncomplicated: Secondary | ICD-10-CM

## 2020-01-23 DIAGNOSIS — F17220 Nicotine dependence, chewing tobacco, uncomplicated: Secondary | ICD-10-CM | POA: Insufficient documentation

## 2020-01-23 DIAGNOSIS — F1019 Alcohol abuse with unspecified alcohol-induced disorder: Secondary | ICD-10-CM | POA: Diagnosis not present

## 2020-01-23 LAB — CBC
HCT: 46 % (ref 39.0–52.0)
Hemoglobin: 15.6 g/dL (ref 13.0–17.0)
MCH: 31.6 pg (ref 26.0–34.0)
MCHC: 33.9 g/dL (ref 30.0–36.0)
MCV: 93.1 fL (ref 80.0–100.0)
Platelets: 247 10*3/uL (ref 150–400)
RBC: 4.94 MIL/uL (ref 4.22–5.81)
RDW: 14.8 % (ref 11.5–15.5)
WBC: 6.4 10*3/uL (ref 4.0–10.5)
nRBC: 0 % (ref 0.0–0.2)

## 2020-01-23 LAB — URINE DRUG SCREEN, QUALITATIVE (ARMC ONLY)
Amphetamines, Ur Screen: NOT DETECTED
Barbiturates, Ur Screen: NOT DETECTED
Benzodiazepine, Ur Scrn: POSITIVE — AB
Cannabinoid 50 Ng, Ur ~~LOC~~: POSITIVE — AB
Cocaine Metabolite,Ur ~~LOC~~: NOT DETECTED
MDMA (Ecstasy)Ur Screen: NOT DETECTED
Methadone Scn, Ur: NOT DETECTED
Opiate, Ur Screen: NOT DETECTED
Phencyclidine (PCP) Ur S: NOT DETECTED
Tricyclic, Ur Screen: NOT DETECTED

## 2020-01-23 LAB — COMPREHENSIVE METABOLIC PANEL
ALT: 68 U/L — ABNORMAL HIGH (ref 0–44)
AST: 77 U/L — ABNORMAL HIGH (ref 15–41)
Albumin: 4.8 g/dL (ref 3.5–5.0)
Alkaline Phosphatase: 71 U/L (ref 38–126)
Anion gap: 12 (ref 5–15)
BUN: 6 mg/dL (ref 6–20)
CO2: 28 mmol/L (ref 22–32)
Calcium: 10.3 mg/dL (ref 8.9–10.3)
Chloride: 100 mmol/L (ref 98–111)
Creatinine, Ser: 0.66 mg/dL (ref 0.61–1.24)
GFR, Estimated: >60 mL/min (ref >60)
Glucose, Bld: 100 mg/dL — ABNORMAL HIGH (ref 70–99)
Potassium: 4.5 mmol/L (ref 3.5–5.1)
Sodium: 140 mmol/L (ref 135–145)
Total Bilirubin: 1 mg/dL (ref 0.3–1.2)
Total Protein: 8.4 g/dL — ABNORMAL HIGH (ref 6.5–8.1)

## 2020-01-23 LAB — ETHANOL: Alcohol, Ethyl (B): <10 mg/dL (ref <10)

## 2020-01-23 MED ORDER — THIAMINE HCL 100 MG PO TABS
100.0000 mg | ORAL_TABLET | Freq: Every day | ORAL | Status: DC
  Administered 2020-01-24 (×2): 100 mg via ORAL
  Filled 2020-01-23 (×2): qty 1

## 2020-01-23 MED ORDER — THIAMINE HCL 100 MG/ML IJ SOLN: 100.0000 mg | Freq: Every day | INTRAMUSCULAR | Status: DC

## 2020-01-23 MED ORDER — LORAZEPAM 2 MG/ML IJ SOLN: 0.0000 mg | Freq: Four times a day (QID) | INTRAMUSCULAR | Status: DC

## 2020-01-23 MED ORDER — LORAZEPAM 2 MG PO TABS: 0.0000 mg | ORAL_TABLET | Freq: Two times a day (BID) | ORAL | Status: DC

## 2020-01-23 MED ORDER — LORAZEPAM 2 MG/ML IJ SOLN: 0.0000 mg | Freq: Two times a day (BID) | INTRAMUSCULAR | Status: DC

## 2020-01-23 MED ORDER — LORAZEPAM 2 MG PO TABS
0.0000 mg | ORAL_TABLET | Freq: Four times a day (QID) | ORAL | Status: DC
  Administered 2020-01-23 – 2020-01-24 (×3): 2 mg via ORAL
  Filled 2020-01-23 (×3): qty 1

## 2020-01-23 NOTE — ED Notes (Signed)
Pt resting at this time, with no other needs noted.
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
## 2020-01-23 NOTE — ED Triage Notes (Signed)
Pt states that he his here for alcohol detox. Pt has been a drinker for 5 years, 8-12 beers a day. Pt seen shaking in triage.

## 2020-01-23 NOTE — ED Provider Notes (Signed)
[facility] Emergency Department Provider Note   ____________________________________________   I have reviewed the triage vital signs and the nursing notes.   HISTORY  Chief Complaint Alcohol Intoxication   History limited by: Not Limited   HPI Victor Long is a 59 y.o. male who presents to the emergency department today because of desire to detox from alcohol. Patient has history of significant alcohol abuse. States last drink was on his way to the emergency department. States he tried going to a detox facility earlier today which turned him down. He says he has been wanting to quit for the past three weeks.    Records reviewed. Per medical record review patient has a history of alcohol abuse. Bipolar.   Past Medical History:  Diagnosis Date  . Back pain, chronic   . Bipolar disorder (HCC)   . Depression   . Diverticulitis   . GERD (gastroesophageal reflux disease)   . Sciatica     Patient Active Problem List   Diagnosis Date Noted  . Complete tear of left rotator cuff 02/23/2017  . Tendinopathy of left biceps 02/23/2017  . Arthritis of left acromioclavicular joint 02/23/2017  . Bipolar affective disorder, current episode depressed (HCC) 07/01/2016  . Bipolar disorder (HCC) 01/09/2016  . Insomnia 01/09/2016  . Diverticulitis 01/04/2016  . BRBPR (bright red blood per rectum) 01/04/2016  . ETOH abuse 01/04/2016    Past Surgical History:  Procedure Laterality Date  . PILONIDAL CYST DRAINAGE    . SHOULDER ARTHROSCOPY WITH ROTATOR CUFF REPAIR AND SUBACROMIAL DECOMPRESSION Left 03/03/2017   Procedure: LEFT SHOULDER ARTHROSCOPY WITH EXTENSIVE DEBRIDEMENT, ROTATOR CUFF REPAIR, SUBACROMIAL DECOMPRESSION, DISTAL CLAVICLE EXCISION;  Surgeon: Tarry Kos, MD;  Location: [facility];  Service: Orthopedics;  Laterality: Left;  . SHOULDER SURGERY     cyst removal    Prior to Admission medications   Medication Sig Start Date End  Date Taking? Authorizing Provider  albuterol (PROVENTIL HFA;VENTOLIN HFA) 108 (90 Base) MCG/ACT inhaler Inhale 2 puffs into the lungs every 4 (four) hours as needed for wheezing or shortness of breath. 02/24/18   Schaevitz, Myra Rude, MD  naproxen sodium (ALEVE) 220 MG tablet Take 220 mg by mouth.    [provider]  ondansetron (ZOFRAN) 4 MG tablet Take 1-2 tablets (4-8 mg total) by mouth every 8 (eight) hours as needed for nausea or vomiting. Patient not taking: Reported on 01/25/2019 03/03/17   Tarry Kos, MD  oxyCODONE (OXY IR/ROXICODONE) 5 MG immediate release tablet Take 1-3 tablets (5-15 mg total) by mouth every 4 (four) hours as needed. Patient not taking: Reported on 01/25/2019 03/03/17   Tarry Kos, MD  oxyCODONE-acetaminophen (PERCOCET) 5-325 MG tablet Take 1-2 tablets by mouth every 4 (four) hours as needed for severe pain. Patient not taking: Reported on 01/25/2019 03/03/17   Tarry Kos, MD  predniSONE (DELTASONE) 50 MG tablet Take 1 tab, PO daily x5 days Patient not taking: Reported on 01/25/2019 02/24/18   Myrna Blazer, MD  promethazine (PHENERGAN) 25 MG tablet Take 1 tablet (25 mg total) by mouth every 6 (six) hours as needed for nausea. Patient not taking: Reported on 01/25/2019 03/03/17   Tarry Kos, MD  senna-docusate (SENOKOT S) 8.6-50 MG tablet Take 1 tablet by mouth at bedtime as needed. Patient not taking: Reported on 01/25/2019 03/03/17   Tarry Kos, MD  tiZANidine (ZANAFLEX) 4 MG tablet Take 1 tablet (4 mg total) by mouth every 6 (six) hours as  needed for muscle spasms. Patient not taking: Reported on 01/25/2019 03/03/17   Tarry Kos, MD  traMADol (ULTRAM) 50 MG tablet Take 1-2 tablets (50-100 mg total) 3 (three) times daily as needed by mouth. Patient not taking: Reported on 01/25/2019 02/23/17   Tarry Kos, MD    Allergies Other  Family History  Problem Relation Age of Onset  . Cancer Father     Social  History Social History   Tobacco Use  . Smoking status: Never Smoker  . Smokeless tobacco: Current User    Types: Chew  Substance Use Topics  . Alcohol use: Yes    Alcohol/week: 6.0 - 12.0 standard drinks    Types: 6 - 12 Cans of beer per week    Comment: daily 6-7 beers  . Drug use: Yes    Types: Marijuana    Comment: not for 28 years    Review of Systems Constitutional: No fever/chills Eyes: No visual changes. ENT: No sore throat. Cardiovascular: Denies chest pain. Respiratory: Denies shortness of breath. Gastrointestinal: No abdominal pain.  No nausea, no vomiting.  No diarrhea.   Genitourinary: Negative for dysuria. Musculoskeletal: Negative for back pain. Skin: Negative for rash. Neurological: Negative for headaches, focal weakness or numbness.  ____________________________________________   PHYSICAL EXAM:  VITAL SIGNS: ED Triage Vitals  Enc Vitals Group     BP 01/23/20 1715 (!) 179/114     Pulse Rate 01/23/20 1715 96     Resp 01/23/20 1715 (!) 22     Temp 01/23/20 1715 98.1 F (36.7 C)     Temp Source 01/23/20 1715 Oral     SpO2 01/23/20 1715 99 %     Weight 01/23/20 1717 150 lb (68 kg)     Height 01/23/20 1717 5\' 4"  (1.626 m)     Head Circumference --      Peak Flow --      Pain Score 01/23/20 1720 0   Constitutional: Alert and oriented.  Eyes: Conjunctivae are normal.  ENT      Head: Normocephalic and atraumatic.      Nose: No congestion/rhinnorhea.      Mouth/Throat: Mucous membranes are moist.      Neck: No stridor. Hematological/Lymphatic/Immunilogical: No cervical lymphadenopathy. Cardiovascular: Normal rate, regular rhythm.  No murmurs, rubs, or gallops.  Respiratory: Normal respiratory effort without tachypnea nor retractions. Breath sounds are clear and equal bilaterally. No wheezes/rales/rhonchi. Gastrointestinal: Soft and non tender. No rebound. No guarding.  Genitourinary: Deferred Musculoskeletal: Normal range of motion in all  extremities. No lower extremity edema. Neurologic:  Normal speech and language. Positive for tremors.  Skin:  Skin is warm, dry and intact. No rash noted. Psychiatric: Mood and affect are normal. Speech and behavior are normal. Patient exhibits appropriate insight and judgment.  ____________________________________________    LABS (pertinent positives/negatives)  Ethanol <10 CBC wbc 6.4, hgb 15.6, plt 247 CMP wnl except glu 100, t pro 8.4, ast 77, alt 68  ____________________________________________   EKG  None  ____________________________________________    RADIOLOGY  None  ____________________________________________   PROCEDURES  Procedures  ____________________________________________   INITIAL IMPRESSION / ASSESSMENT AND PLAN / ED COURSE  Pertinent labs & imaging results that were available during my care of the patient were reviewed by me and considered in my medical decision making (see chart for details).   Patient presented to the emergency department today with desire for detox. Will have TTS evaluate to see if patient can be placed in detox facility.  ____________________________________________   FINAL CLINICAL IMPRESSION(S) / ED DIAGNOSES  Final diagnoses:  Alcohol abuse     Note: This dictation was prepared with Dragon dictation. Any transcriptional errors that result from this process are unintentional     Phineas Semen, MD 01/23/20 [phone]

## 2020-01-23 NOTE — ED Notes (Signed)
PT  VOL °

## 2020-01-23 NOTE — BH Assessment (Addendum)
Referral information Detox treatment faxed to;   [facility] [phone])  . [facility] [phone]) Reports male beds are available and to contact back after 7:30am

## 2020-01-24 NOTE — BH Assessment (Signed)
Followed up with [facility] ([phone]), patient will be admitted with them. Writer updated the patien'ts nurse.

## 2020-01-24 NOTE — ED Notes (Signed)
Assumed care of patient patient sleeping comfortably as per prior nurse patient slept through the night. VSS CIWA precautions maintained. Awaiting placement for ETOH rehab.

## 2020-04-08 ENCOUNTER — Encounter (HOSPITAL_COMMUNITY): Payer: Self-pay

## 2020-04-08 ENCOUNTER — Other Ambulatory Visit: Payer: Self-pay

## 2020-04-08 ENCOUNTER — Ambulatory Visit (HOSPITAL_COMMUNITY)
Admission: EM | Admit: 2020-04-08 | Discharge: 2020-04-08 | Disposition: A | Discharge status: Home / Self Care | Payer: Medicaid Other | Attending: Student | Admitting: Student

## 2020-04-08 DIAGNOSIS — M1612 Unilateral primary osteoarthritis, left hip: Secondary | ICD-10-CM

## 2020-04-08 MED ORDER — NAPROXEN 500 MG PO TABS: 500.0000 mg | ORAL_TABLET | Freq: Two times a day (BID) | ORAL | 0 refills | Status: AC

## 2020-04-08 NOTE — Discharge Instructions (Addendum)
Please establish care with primary care provider for improved control of chronic osteoarthritis. You can also make an appointment with sports medicine specialist. Information provided below.

## 2020-04-08 NOTE — ED Triage Notes (Signed)
Pt in with c/o left hip and knee pain that has been going on for over 1 month now but is getting worse. States that his knee is starting to lock up on him.  Pt has tried aspirin and tylenol for pain with no relief

## 2020-04-08 NOTE — ED Provider Notes (Signed)
[facility]    CSN: [id number] Arrival date & time: 04/08/20  1800      History   Chief Complaint Chief Complaint  Patient presents with   Back Pain   Hip Pain   Knee Pain    HPI Victor Long is a 59 y.o. male presenting with back pain, left hip pain, and knee pain. History of alcohol abuse disorder, chronic back pain, bipolar disorder, depression, diverticulitis, GERD, sciatica. He states that he is having his normal back pain and knee pain without changes or injury. Presenting today for left hip pain for 4 weeks- exacerbation of OA which was diagnosed 2016. Denies trauma or injury even in distant past. States that he works on his feet all day and pain is 20/10 during work. Left sided sciatica but this in unchanged from baseline. Denies weakness or sensation changes in extremities. Wishing for something for the pain during work days. Has already tried ibuprofen without relief. Has been worked up for hip pain in the past and was told in the past that he has arthritis of this hip. Denies chest pain, shortness of breath, headaches, vision changes, bowel/bladder incontinence, sensation changes in UEs or LEs.   HPI  Past Medical History:  Diagnosis Date   Back pain, chronic    Bipolar disorder (HCC)    Depression    Diverticulitis    GERD (gastroesophageal reflux disease)    Sciatica     Patient Active Problem List   Diagnosis Date Noted   Complete tear of left rotator cuff 02/23/2017   Tendinopathy of left biceps 02/23/2017   Arthritis of left acromioclavicular joint 02/23/2017   Bipolar affective disorder, current episode depressed (HCC) 07/01/2016   Bipolar disorder (HCC) 01/09/2016   Insomnia 01/09/2016   Diverticulitis 01/04/2016   BRBPR (bright red blood per rectum) 01/04/2016   ETOH abuse 01/04/2016    Past Surgical History:  Procedure Laterality Date   PILONIDAL CYST DRAINAGE     SHOULDER ARTHROSCOPY WITH ROTATOR CUFF REPAIR  AND SUBACROMIAL DECOMPRESSION Left 03/03/2017   Procedure: LEFT SHOULDER ARTHROSCOPY WITH EXTENSIVE DEBRIDEMENT, ROTATOR CUFF REPAIR, SUBACROMIAL DECOMPRESSION, DISTAL CLAVICLE EXCISION;  Surgeon: Tarry Kos, MD;  Location: [facility];  Service: Orthopedics;  Laterality: Left;   SHOULDER SURGERY     cyst removal       Home Medications    Prior to Admission medications   Medication Sig Start Date End Date Taking? Authorizing Provider  naproxen (NAPROSYN) 500 MG tablet Take 1 tablet (500 mg total) by mouth 2 (two) times daily. 04/08/20  Yes Rhys Martini, PA-C  albuterol (PROVENTIL HFA;VENTOLIN HFA) 108 (90 Base) MCG/ACT inhaler Inhale 2 puffs into the lungs every 4 (four) hours as needed for wheezing or shortness of breath. Patient not taking: Reported on 01/23/2020 02/24/18 04/08/20  Myrna Blazer, MD  promethazine (PHENERGAN) 25 MG tablet Take 1 tablet (25 mg total) by mouth every 6 (six) hours as needed for nausea. Patient not taking: Reported on 01/25/2019 03/03/17 04/08/20  Tarry Kos, MD    Family History Family History  Problem Relation Age of Onset   Cancer Father     Social History Social History   Tobacco Use   Smoking status: Never Smoker   Smokeless tobacco: Current User    Types: Chew  Substance Use Topics   Alcohol use: Yes    Alcohol/week: 6.0 - 12.0 standard drinks    Types: 6 - 12 Cans of beer per week  Comment: daily 6-7 beers   Drug use: Yes    Types: Marijuana    Comment: not for 28 years     Allergies   Other   Review of Systems Review of Systems  Musculoskeletal: Positive for back pain.       Left hip pain Left knee pain (unchanged for years) Back pain (unchanged for years)  All other systems reviewed and are negative.    Physical Exam Triage Vital Signs ED Triage Vitals  Enc Vitals Group     BP 04/08/20 2011 (!) 145/87     Pulse Rate 04/08/20 2011 86     Resp 04/08/20 2011 20     Temp --       Temp Source 04/08/20 2011 Oral     SpO2 04/08/20 2011 100 %     Weight --      Height --      Head Circumference --      Peak Flow --      Pain Score 04/08/20 2009 4     Pain Loc --      Pain Edu? --      Excl. in GC? --    No data found.  Updated Vital Signs BP (!) 145/87 (BP Location: Right Arm)    Pulse 86    Resp 20    SpO2 100%   Visual Acuity Right Eye Distance:   Left Eye Distance:   Bilateral Distance:    Right Eye Near:   Left Eye Near:    Bilateral Near:     Physical Exam Vitals reviewed.  Constitutional:      General: He is not in acute distress.    Appearance: Normal appearance. He is not ill-appearing.  HENT:     Head: Normocephalic and atraumatic.  Cardiovascular:     Rate and Rhythm: Normal rate and regular rhythm.     Heart sounds: Normal heart sounds.  Pulmonary:     Effort: Pulmonary effort is normal.     Breath sounds: Normal breath sounds and air entry.  Abdominal:     Palpations: Abdomen is soft.     Tenderness: There is no abdominal tenderness. There is no right CVA tenderness, left CVA tenderness, guarding or rebound.     Comments: No bowel or bladder incontinence.  Musculoskeletal:     Cervical back: Normal range of motion. No swelling, deformity, signs of trauma, rigidity, spasms, tenderness, bony tenderness or crepitus. No pain with movement.     Thoracic back: No swelling, deformity, signs of trauma, spasms, tenderness or bony tenderness. Normal range of motion. No scoliosis.     Lumbar back: No swelling, deformity, signs of trauma, spasms, tenderness or bony tenderness. Normal range of motion. Negative right straight leg raise test and negative left straight leg raise test. No scoliosis.     Comments: Strength 5/5 in UEs and LEs.  Left knee: no tenderness to palpation or bony deformity. Crepitus with flexion. Strength and sensation intact.   Left hip: no tenderness to palpation or bony deformity. Crepitus with flexion. Strength and  sensation intact.   Neurological:     General: No focal deficit present.     Mental Status: He is alert.     Cranial Nerves: No cranial nerve deficit.     Comments: Strength 5/5 in UEs and LEs. Gait normal. Sensation intact in UEs and LEs. No saddle anesthesia.  Psychiatric:        Mood and Affect: Mood normal.  Behavior: Behavior normal.        Thought Content: Thought content normal.        Judgment: Judgment normal.      UC Treatments / Results  Labs (all labs ordered are listed, but only abnormal results are displayed) Labs Reviewed - No data to display  EKG   Radiology No results found.  Procedures Procedures (including critical care time)  Medications Ordered in UC Medications - No data to display  Initial Impression / Assessment and Plan / UC Course  I have reviewed the triage vital signs and the nursing notes.  Pertinent labs & imaging results that were available during my care of the patient were reviewed by me and considered in my medical decision making (see chart for details).     Xray L hip per care everywhere:  FINDINGS:  Bones:Normal alignment and position. No fracture, or dislocation.. Mild hypertrophy along the acetabular margin creates some over coverage.  Joints: Slight medial narrowing of the hip joint.  Mineralization: Normal  Soft Tissue:Normal    IMPRESSION:  Mild developing osteoarthritis.Marland Kitchen     Pt presenting today with chronic back and left knee pain, unchanged for years. Also presenting with chronic L hip OA, unchanged for years- but exacerbated for last 4 weeks due to active job. No recent injury or trauma.   As above, xray of L hip (2016) showed degeneration of cartilage and arthritis. He is today presenting with L hip pain that he attributes to active job; denies trauma or injury. Exam consistent with OA. Discussed that I cannot prescribe controlled substance for chronic arthritis in this setting, particularly given his history  of currently active alcohol use disorder. Script for naproxen sent. He does not have a primary care provider. Information for PCP and sports medicine clinic sent.   Denies pain shooting down legs, denies numbness in arms/legs, denies weakness in arms/legs, denies saddle anesthesia, denies bowel/bladder incontinence.   Return precautions- trauma, pain shooting down legs, denies numbness in arms/legs, denies weakness in arms/legs, denies saddle anesthesia, denies bowel/bladder incontinence.   Final Clinical Impressions(s) / UC Diagnoses   Final diagnoses:  Arthritis of left hip     Discharge Instructions     Please establish care with primary care provider for improved control of chronic osteoarthritis. You can also make an appointment with sports medicine specialist. Information provided below.     ED Prescriptions    Medication Sig Dispense Auth. Provider   naproxen (NAPROSYN) 500 MG tablet Take 1 tablet (500 mg total) by mouth 2 (two) times daily. 30 tablet Rhys Martini, PA-C     PDMP not reviewed this encounter.   Rhys Martini, PA-C 04/09/20 [phone]

## 2020-07-21 ENCOUNTER — Emergency Department (HOSPITAL_COMMUNITY): Payer: Medicaid Other

## 2020-07-21 ENCOUNTER — Other Ambulatory Visit: Payer: Self-pay

## 2020-07-21 ENCOUNTER — Emergency Department (HOSPITAL_COMMUNITY)
Admission: EM | Admit: 2020-07-21 | Discharge: 2020-07-21 | Disposition: A | Discharge status: Home / Self Care | Payer: Medicaid Other | Attending: Emergency Medicine | Admitting: Emergency Medicine

## 2020-07-21 ENCOUNTER — Encounter (HOSPITAL_COMMUNITY): Payer: Self-pay

## 2020-07-21 DIAGNOSIS — F17220 Nicotine dependence, chewing tobacco, uncomplicated: Secondary | ICD-10-CM | POA: Insufficient documentation

## 2020-07-21 DIAGNOSIS — M25512 Pain in left shoulder: Secondary | ICD-10-CM | POA: Diagnosis not present

## 2020-07-21 DIAGNOSIS — M25511 Pain in right shoulder: Secondary | ICD-10-CM | POA: Insufficient documentation

## 2020-07-21 DIAGNOSIS — M79642 Pain in left hand: Secondary | ICD-10-CM | POA: Insufficient documentation

## 2020-07-21 DIAGNOSIS — M5412 Radiculopathy, cervical region: Secondary | ICD-10-CM

## 2020-07-21 DIAGNOSIS — M79641 Pain in right hand: Secondary | ICD-10-CM | POA: Insufficient documentation

## 2020-07-21 MED ORDER — PREDNISONE 50 MG PO TABS: 50.0000 mg | ORAL_TABLET | Freq: Every day | ORAL | 0 refills | Status: AC

## 2020-07-21 MED ORDER — KETOROLAC TROMETHAMINE 15 MG/ML IJ SOLN
15.0000 mg | Freq: Once | INTRAMUSCULAR | Status: AC
  Administered 2020-07-21: 15 mg via INTRAMUSCULAR
  Filled 2020-07-21: qty 1

## 2020-07-21 MED ORDER — LIDOCAINE 5 % EX PTCH
1.0000 | MEDICATED_PATCH | CUTANEOUS | Status: DC
  Administered 2020-07-21: 1 via TRANSDERMAL
  Filled 2020-07-21: qty 1

## 2020-07-21 MED ORDER — METHOCARBAMOL 500 MG PO TABS: 500.0000 mg | ORAL_TABLET | Freq: Two times a day (BID) | ORAL | 0 refills | Status: AC | PRN

## 2020-07-21 NOTE — Discharge Instructions (Signed)
You were seen in the emergency department today for your shoulder and arm.  Your physical exam and vital signs are very reassuring.  The muscles in your neck and shoulders are in what is called spasm, meaning they are inappropriately tightened up.  This can be quite painful.  Additionally it appears that the muscular spasm has caused inflammation and compression of the nerves coming from your neck which is causing the numbness and tingling sensation in her hands.  To help with your pain you may take Tylenol and / or NSAID medication (such as ibuprofen or naproxen) to help with your pain.  Additionally, you have been prescribed a muscle relaxer called Robaxin to help relieve some of the muscle spasm.  Please be advised that this medication may make you very sleepy, so you should not drive or operate heavy machinery while you are taking it.  Additionally you have been prescribed a steroid to help with the tingling sensation.  You may also utilize topical pain relief such as Biofreeze, IcyHot, or topical lidocaine patches.  I also recommend that you apply heat to the area, such as a hot shower or heating pad, and follow heat application with massage of the muscles that are most tight.  Please return to the emergency department if you develop any numbness/tingling/weakness in your arms or legs, any difficulty urinating, or urinary incontinence chest pain, shortness of breath, abdominal pain, nausea or vomiting that does not stop, or any other new severe symptoms.

## 2020-07-21 NOTE — ED Triage Notes (Signed)
Pt c/o bilateral shoulder and hand pain. Pt states the pain has been progressively worsening over past month. Pain is in shoulder joints and in joint of both thumbs. Pt denies recent injury.
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
## 2020-10-22 ENCOUNTER — Other Ambulatory Visit: Payer: Self-pay

## 2020-10-22 ENCOUNTER — Emergency Department (HOSPITAL_COMMUNITY)
Admission: EM | Admit: 2020-10-22 | Discharge: 2020-10-23 | Disposition: A | Discharge status: Home / Self Care | Payer: Medicaid Other | Attending: Emergency Medicine | Admitting: Emergency Medicine

## 2020-10-22 DIAGNOSIS — F17220 Nicotine dependence, chewing tobacco, uncomplicated: Secondary | ICD-10-CM | POA: Diagnosis not present

## 2020-10-22 DIAGNOSIS — K219 Gastro-esophageal reflux disease without esophagitis: Secondary | ICD-10-CM | POA: Insufficient documentation

## 2020-10-22 DIAGNOSIS — K529 Noninfective gastroenteritis and colitis, unspecified: Secondary | ICD-10-CM | POA: Diagnosis not present

## 2020-10-22 DIAGNOSIS — R109 Unspecified abdominal pain: Secondary | ICD-10-CM | POA: Diagnosis present

## 2020-10-22 LAB — CBC WITH DIFFERENTIAL/PLATELET
Abs Immature Granulocytes: 0.03 10*3/uL (ref 0.00–0.07)
Basophils Absolute: 0 10*3/uL (ref 0.0–0.1)
Basophils Relative: 0 %
Eosinophils Absolute: 0 10*3/uL (ref 0.0–0.5)
Eosinophils Relative: 0 %
HCT: 43.4 % (ref 39.0–52.0)
Hemoglobin: 14.6 g/dL (ref 13.0–17.0)
Immature Granulocytes: 0 %
Lymphocytes Relative: 4 %
Lymphs Abs: 0.5 10*3/uL — ABNORMAL LOW (ref 0.7–4.0)
MCH: 31.1 pg (ref 26.0–34.0)
MCHC: 33.6 g/dL (ref 30.0–36.0)
MCV: 92.5 fL (ref 80.0–100.0)
Monocytes Absolute: 0.8 10*3/uL (ref 0.1–1.0)
Monocytes Relative: 6 %
Neutro Abs: 10.8 10*3/uL — ABNORMAL HIGH (ref 1.7–7.7)
Neutrophils Relative %: 90 %
Platelets: 271 10*3/uL (ref 150–400)
RBC: 4.69 MIL/uL (ref 4.22–5.81)
RDW: 14.8 % (ref 11.5–15.5)
WBC: 12.1 10*3/uL — ABNORMAL HIGH (ref 4.0–10.5)
nRBC: 0 % (ref 0.0–0.2)

## 2020-10-22 MED ORDER — HYDROCODONE-ACETAMINOPHEN 5-325 MG PO TABS: 2.0000 | ORAL_TABLET | Freq: Once | ORAL | Status: DC

## 2020-10-22 MED ORDER — ONDANSETRON 4 MG PO TBDP: 4.0000 mg | ORAL_TABLET | Freq: Once | ORAL | Status: DC

## 2020-10-22 NOTE — ED Triage Notes (Signed)
Pt c/o constant upper abdominal pain starting about 6 hours ago associated with NVD, and fatigue. Pain worsens with movement. Endorsed daily ETOH.

## 2020-10-22 NOTE — ED Notes (Signed)
Called for triage x3, no response. 

## 2020-10-22 NOTE — ED Provider Notes (Signed)
Emergency Medicine Provider Triage Evaluation Note  Victor Long , a 60 y.o. male  was evaluated in triage.  Pt complains of abdominal pain.  Onset about 6 hours ago. Hx of diverticulitis.  States this feels the same.  Reports bloody stools and non-bloody vomiting.  Rates pain as severe.  Review of Systems  Positive: Abdominal pain, bloody stools Negative: Fever, cough  Physical Exam  BP 112/78 (BP Location: Left Arm)   Pulse 98   Temp 98.2 F (36.8 C) (Oral)   Resp 18   SpO2 99%  Gen:   Awake, no distress   Resp:  Normal effort  MSK:   Moves extremities without difficulty  Other:  Very tender abdomen, diffuse  Medical Decision Making  Medically screening exam initiated at 10:57 PM.  Appropriate orders placed.  Huy Majid was informed that the remainder of the evaluation will be completed by another provider, this initial triage assessment does not replace that evaluation, and the importance of remaining in the ED until their evaluation is complete.  Abdominal pain, ?diverticulitis, seems peritonitic, will try to expedite based on room availability.   Roxy Horseman, PA-C 10/22/20 2258    Melene Plan, DO 10/22/20 2319

## 2020-10-22 NOTE — ED Notes (Signed)
No reply for vitals x5

## 2020-10-23 ENCOUNTER — Emergency Department (HOSPITAL_COMMUNITY): Payer: Medicaid Other

## 2020-10-23 LAB — COMPREHENSIVE METABOLIC PANEL
ALT: 17 U/L (ref 0–44)
AST: 26 U/L (ref 15–41)
Albumin: 3.3 g/dL — ABNORMAL LOW (ref 3.5–5.0)
Alkaline Phosphatase: 91 U/L (ref 38–126)
Anion gap: 8 (ref 5–15)
BUN: 6 mg/dL — ABNORMAL LOW (ref 8–23)
CO2: 25 mmol/L (ref 22–32)
Calcium: 8.8 mg/dL — ABNORMAL LOW (ref 8.9–10.3)
Chloride: 106 mmol/L (ref 98–111)
Creatinine, Ser: 0.8 mg/dL (ref 0.61–1.24)
GFR, Estimated: >60 mL/min (ref >60)
Glucose, Bld: 152 mg/dL — ABNORMAL HIGH (ref 70–99)
Potassium: 3.7 mmol/L (ref 3.5–5.1)
Sodium: 139 mmol/L (ref 135–145)
Total Bilirubin: 0.6 mg/dL (ref 0.3–1.2)
Total Protein: 6.2 g/dL — ABNORMAL LOW (ref 6.5–8.1)

## 2020-10-23 LAB — LIPASE, BLOOD: Lipase: 27 U/L (ref 11–51)

## 2020-10-23 MED ORDER — ONDANSETRON HCL 4 MG/2ML IJ SOLN
4.0000 mg | Freq: Once | INTRAMUSCULAR | Status: AC
  Administered 2020-10-23: 4 mg via INTRAVENOUS
  Filled 2020-10-23: qty 2

## 2020-10-23 MED ORDER — SODIUM CHLORIDE 0.9 % IV BOLUS
1000.0000 mL | Freq: Once | INTRAVENOUS | Status: AC
  Administered 2020-10-23: 1000 mL via INTRAVENOUS

## 2020-10-23 MED ORDER — MORPHINE SULFATE (PF) 4 MG/ML IV SOLN
4.0000 mg | Freq: Once | INTRAVENOUS | Status: AC
  Administered 2020-10-23: 4 mg via INTRAVENOUS
  Filled 2020-10-23: qty 1

## 2020-10-23 MED ORDER — IOHEXOL 300 MG/ML  SOLN
100.0000 mL | Freq: Once | INTRAMUSCULAR | Status: AC | PRN
  Administered 2020-10-23: 100 mL via INTRAVENOUS

## 2020-10-23 MED ORDER — METRONIDAZOLE 500 MG PO TABS: 500.0000 mg | ORAL_TABLET | Freq: Two times a day (BID) | ORAL | 0 refills | Status: AC

## 2020-10-23 MED ORDER — ONDANSETRON 4 MG PO TBDP: 4.0000 mg | ORAL_TABLET | Freq: Three times a day (TID) | ORAL | 0 refills | Status: AC | PRN

## 2020-10-23 MED ORDER — METRONIDAZOLE 500 MG PO TABS
500.0000 mg | ORAL_TABLET | Freq: Once | ORAL | Status: AC
  Administered 2020-10-23: 500 mg via ORAL
  Filled 2020-10-23: qty 1

## 2020-10-23 MED ORDER — CIPROFLOXACIN HCL 500 MG PO TABS
500.0000 mg | ORAL_TABLET | Freq: Once | ORAL | Status: AC
  Administered 2020-10-23: 500 mg via ORAL
  Filled 2020-10-23: qty 1

## 2020-10-23 MED ORDER — CIPROFLOXACIN HCL 500 MG PO TABS: 500.0000 mg | ORAL_TABLET | Freq: Two times a day (BID) | ORAL | 0 refills | Status: AC

## 2020-10-23 NOTE — Discharge Instructions (Addendum)
Take the prescribed medication as directed. Follow-up with your primary care doctor. Return to the ED for new or worsening symptoms-- uncontrolled fever, severe pain, etc.

## 2020-10-23 NOTE — ED Provider Notes (Signed)
[facility] Provider Note   CSN: [id number] Arrival date & time: 10/22/20  2028     History Chief Complaint  Patient presents with   Abdominal Pain    Victor Long is a 60 y.o. male.  The history is provided by the patient and medical records.  Abdominal Pain Associated symptoms: diarrhea, nausea and vomiting    60 year old male with history of bipolar disorder, depression, acid reflux, history of diverticulitis, presenting to the ED with abdominal pain.  States it began around 10 AM this morning and has been steadily worsening throughout the day.  States he tried to go to work and was sent home.  He does report numerous amounts of diarrhea, has been a little bloody along with some vomiting which has been nonbloody.  Pain is now severe.  He reports history of diverticulitis in the past with similar symptoms.  Last incidence was very complicated with perforation and abscess, ultimately requiring temporary colostomy which has since been reversed.  No meds taken PTA.  Past Medical History:  Diagnosis Date   Back pain, chronic    Bipolar disorder (HCC)    Depression    Diverticulitis    GERD (gastroesophageal reflux disease)    Sciatica     Patient Active Problem List   Diagnosis Date Noted   Complete tear of left rotator cuff 02/23/2017   Tendinopathy of left biceps 02/23/2017   Arthritis of left acromioclavicular joint 02/23/2017   Bipolar affective disorder, current episode depressed (HCC) 07/01/2016   Bipolar disorder (HCC) 01/09/2016   Insomnia 01/09/2016   Diverticulitis 01/04/2016   BRBPR (bright red blood per rectum) 01/04/2016   ETOH abuse 01/04/2016    Past Surgical History:  Procedure Laterality Date   PILONIDAL CYST DRAINAGE     SHOULDER ARTHROSCOPY WITH ROTATOR CUFF REPAIR AND SUBACROMIAL DECOMPRESSION Left 03/03/2017   Procedure: LEFT SHOULDER ARTHROSCOPY WITH EXTENSIVE DEBRIDEMENT, ROTATOR CUFF REPAIR,  SUBACROMIAL DECOMPRESSION, DISTAL CLAVICLE EXCISION;  Surgeon: Tarry KosXu, Naiping M, MD;  Location: [facility];  Service: Orthopedics;  Laterality: Left;   SHOULDER SURGERY     cyst removal       Family History  Problem Relation Age of Onset   Cancer Father     Social History   Tobacco Use   Smoking status: Never   Smokeless tobacco: Current    Types: Chew  Substance Use Topics   Alcohol use: Yes    Alcohol/week: 6.0 - 12.0 standard drinks    Types: 6 - 12 Cans of beer per week    Comment: daily 6-7 beers   Drug use: Yes    Types: Marijuana    Comment: not for 28 years    Home Medications Prior to Admission medications   Medication Sig Start Date End Date Taking? Authorizing Provider  methocarbamol (ROBAXIN) 500 MG tablet Take 1 tablet (500 mg total) by mouth 2 (two) times daily as needed for muscle spasms. 07/21/20   Sponseller, Lupe Carneyebekah R, PA-C  naproxen (NAPROSYN) 500 MG tablet Take 1 tablet (500 mg total) by mouth 2 (two) times daily. 04/08/20   Rhys MartiniGraham, Laura E, PA-C  albuterol (PROVENTIL HFA;VENTOLIN HFA) 108 (90 Base) MCG/ACT inhaler Inhale 2 puffs into the lungs every 4 (four) hours as needed for wheezing or shortness of breath. Patient not taking: Reported on 01/23/2020 02/24/18 04/08/20  Myrna BlazerSchaevitz, David Matthew, MD  promethazine (PHENERGAN) 25 MG tablet Take 1 tablet (25 mg total) by mouth every 6 (six) hours as needed  for nausea. Patient not taking: Reported on 01/25/2019 03/03/17 04/08/20  Tarry Kos, MD    Allergies    Other  Review of Systems   Review of Systems  Gastrointestinal:  Positive for abdominal pain, diarrhea, nausea and vomiting.  All other systems reviewed and are negative.  Physical Exam Updated Vital Signs BP 112/78 (BP Location: Left Arm)   Pulse 98   Temp 98.2 F (36.8 C) (Oral)   Resp 18   Ht 5\' 5"  (1.651 m)   Wt 68 kg   SpO2 99%   BMI 24.96 kg/m   Physical Exam Vitals and nursing note reviewed.  Constitutional:       Appearance: He is well-developed.  HENT:     Head: Normocephalic and atraumatic.  Eyes:     Conjunctiva/sclera: Conjunctivae normal.     Pupils: Pupils are equal, round, and reactive to light.  Cardiovascular:     Rate and Rhythm: Normal rate and regular rhythm.     Heart sounds: Normal heart sounds.  Pulmonary:     Effort: Pulmonary effort is normal.     Breath sounds: Normal breath sounds.  Abdominal:     General: Bowel sounds are normal.     Palpations: Abdomen is soft.     Tenderness: There is abdominal tenderness in the right lower quadrant, suprapubic area and left lower quadrant.  Musculoskeletal:        General: Normal range of motion.     Cervical back: Normal range of motion.  Skin:    General: Skin is warm and dry.  Neurological:     Mental Status: He is alert and oriented to person, place, and time.    ED Results / Procedures / Treatments   Labs (all labs ordered are listed, but only abnormal results are displayed) Labs Reviewed  CBC WITH DIFFERENTIAL/PLATELET - Abnormal; Notable for the following components:      Result Value   WBC 12.1 (*)    Neutro Abs 10.8 (*)    Lymphs Abs 0.5 (*)    All other components within normal limits  COMPREHENSIVE METABOLIC PANEL - Abnormal; Notable for the following components:   Glucose, Bld 152 (*)    BUN 6 (*)    Calcium 8.8 (*)    Total Protein 6.2 (*)    Albumin 3.3 (*)    All other components within normal limits  LIPASE, BLOOD    EKG None  Radiology CT ABDOMEN PELVIS W CONTRAST  Result Date: 10/23/2020 CLINICAL DATA:  Nonlocalized acute abdominal pain EXAM: CT ABDOMEN AND PELVIS WITH CONTRAST TECHNIQUE: Multidetector CT imaging of the abdomen and pelvis was performed using the standard protocol following bolus administration of intravenous contrast. CONTRAST:  10/25/2020 OMNIPAQUE IOHEXOL 300 MG/ML  SOLN COMPARISON:  08/31/2019 FINDINGS: Lower chest: Atelectatic changes in the otherwise clear lung bases. Normal heart  size. No pericardial effusion. Hepatobiliary: Subcentimeter hypoattenuating focus in the anterior aspect of segment IV, possible simple cyst versus ciliated foregut cyst in this location, both of which reflect benign incidental findings. No concerning focal liver lesion. Smooth liver surface contour. Normal hepatic attenuation. Normal gallbladder and biliary tree. Pancreas: No pancreatic ductal dilatation or surrounding inflammatory changes. Spleen: Normal in size. No concerning splenic lesions. Adrenals/Urinary Tract: Normal adrenal glands. Kidneys are normally located with symmetric enhancementand excretion with bilateral extrarenal pelves, benign incidental. Mild symmetric bilateral perinephric stranding is unchanged from comparison prior, can be seen as senescent change or with diminished renal function. No suspicious renal lesion,  urolithiasis or hydronephrosis. Circumferential bladder wall thickening with hazy perivesicular stranding, wall thickening greater than expected for underdistention. Stomach/Bowel: Small sliding-type hiatal hernia. Distal stomach and duodenum are unremarkable. The proximal small bowel is fairly uniform and unremarkable in appearance. There is a more fluid-filled in questionably thickened appearance of the terminal ileum with a small bowel-small bowel anastomosis just proximal to the ileocecal valve. The colon as well demonstrates some questionable edematous mural thickening most pronounced towards the level of the rectum just beyond a patent rectosigmoid anastomosis relative lack of formed stool is noted which could suggest a rapid transit state. No evidence of bowel obstruction. No extraluminal gas or organized collection. Vascular/Lymphatic: Atherosclerotic calcifications within the abdominal aorta and branch vessels. No aneurysm or ectasia. No enlarged abdominopelvic lymph nodes. Reproductive: The prostate and seminal vesicles are unremarkable. Calcifications noted along the penile  corpora, nonspecific though can be seen in the setting of Peyronie disease. Bilateral vasectomy clips. Other: Focal scarring along the right lower quadrant abdominal wall, question prior ostomy takedown or other surgical change. No abdominopelvic free air or fluid. No organized collection or abscess. Tiny fat containing infraumbilical ventral hernia with minimal stranding, correlate for point tenderness. Musculoskeletal: Multilevel degenerative changes are present in the imaged portions of the spine. Additional degenerative changes in the hips and pelvis. No acute osseous abnormality or suspicious osseous lesion. IMPRESSION: Fluid-filled terminal ileum with mild mural thickening as well as pancolonic edematous mural most pronounced at the ascending colon and rectum. Relative lack of formed stool is noted as well suggesting a rapid transit state. Constellation of findings are somewhat nonspecific though can certainly be seen in the setting of an enterocolitis. Correlate with clinical symptoms. Evidence of prior bowel surgery with a patent small bowel-small bowel anastomosis in the right lower quadrant and a colorectal anastomosis in the deep pelvis. Additional postsurgical changes from prior appendectomy. Circumferentially thickened urinary bladder, greater than expected for underdistention. Recommend correlation with urinalysis to exclude cystitis. Calcifications along the penile corpora, nonspecific though could correlate for clinical symptoms of Peyronie disease. Aortic Atherosclerosis (ICD10-I70.0). Electronically Signed   By: Kreg Shropshire M.D.   On: 10/23/2020 03:13    Procedures Procedures   Medications Ordered in ED Medications  sodium chloride 0.9 % bolus 1,000 mL (0 mLs Intravenous Stopped 10/23/20 0246)  morphine 4 MG/ML injection 4 mg (4 mg Intravenous Given 10/23/20 0133)  ondansetron (ZOFRAN) injection 4 mg (4 mg Intravenous Given 10/23/20 0133)  iohexol (OMNIPAQUE) 300 MG/ML solution 100 mL (100  mLs Intravenous Contrast Given 10/23/20 0257)  ondansetron (ZOFRAN) injection 4 mg (4 mg Intravenous Given 10/23/20 0447)  ciprofloxacin (CIPRO) tablet 500 mg (500 mg Oral Given 10/23/20 0447)  metroNIDAZOLE (FLAGYL) tablet 500 mg (500 mg Oral Given 10/23/20 0447)    ED Course  I have reviewed the triage vital signs and the nursing notes.  Pertinent labs & imaging results that were available during my care of the patient were reviewed by me and considered in my medical decision making (see chart for details).    MDM Rules/Calculators/A&P   60 year old male presenting to the ED with lower abdominal discomfort along with loose stools.  Reports history of diverticulitis and this feels similar.  He is afebrile and nontoxic in appearance here.  Does have tenderness all along the lower abdomen.  Maintains normal bowel sounds, no significant distention.  Labs with mild leukocytosis.  CT scan was obtained, findings concerning for colitis.  There is no apparent abscess formation or bowel perforation.  Patient remains hemodynamically stable.  Feel he stable for discharge home.  We will treat with course of antibiotics and have him follow-up closely with primary care doctor.  Encouraged to return here for any new or acute changes.  Final Clinical Impression(s) / ED Diagnoses Final diagnoses:  Colitis    Rx / DC Orders ED Discharge Orders          Ordered    ciprofloxacin (CIPRO) 500 MG tablet  Every 12 hours        10/23/20 0551    metroNIDAZOLE (FLAGYL) 500 MG tablet  2 times daily        10/23/20 0551    ondansetron (ZOFRAN ODT) 4 MG disintegrating tablet  Every 8 hours PRN        10/23/20 0551             Garlon Hatchet, PA-C 10/23/20 [id number]    Benjiman Core, MD 10/23/20 [phone]

## 2021-01-03 ENCOUNTER — Emergency Department (HOSPITAL_COMMUNITY): Payer: Medicaid Other

## 2021-01-03 ENCOUNTER — Emergency Department (HOSPITAL_COMMUNITY)
Admission: EM | Admit: 2021-01-03 | Discharge: 2021-01-03 | Disposition: A | Discharge status: Home / Self Care | Payer: Medicaid Other | Attending: Emergency Medicine | Admitting: Emergency Medicine

## 2021-01-03 ENCOUNTER — Encounter (HOSPITAL_COMMUNITY): Payer: Self-pay

## 2021-01-03 DIAGNOSIS — Z23 Encounter for immunization: Secondary | ICD-10-CM | POA: Diagnosis not present

## 2021-01-03 DIAGNOSIS — Y9389 Activity, other specified: Secondary | ICD-10-CM | POA: Diagnosis not present

## 2021-01-03 DIAGNOSIS — F17220 Nicotine dependence, chewing tobacco, uncomplicated: Secondary | ICD-10-CM | POA: Insufficient documentation

## 2021-01-03 DIAGNOSIS — S0181XA Laceration without foreign body of other part of head, initial encounter: Secondary | ICD-10-CM | POA: Diagnosis not present

## 2021-01-03 DIAGNOSIS — Z20822 Contact with and (suspected) exposure to covid-19: Secondary | ICD-10-CM | POA: Diagnosis not present

## 2021-01-03 DIAGNOSIS — S61211A Laceration without foreign body of left index finger without damage to nail, initial encounter: Secondary | ICD-10-CM | POA: Insufficient documentation

## 2021-01-03 DIAGNOSIS — S61215A Laceration without foreign body of left ring finger without damage to nail, initial encounter: Secondary | ICD-10-CM | POA: Diagnosis not present

## 2021-01-03 DIAGNOSIS — S61213A Laceration without foreign body of left middle finger without damage to nail, initial encounter: Secondary | ICD-10-CM | POA: Insufficient documentation

## 2021-01-03 DIAGNOSIS — Y990 Civilian activity done for income or pay: Secondary | ICD-10-CM | POA: Insufficient documentation

## 2021-01-03 DIAGNOSIS — W312XXA Contact with powered woodworking and forming machines, initial encounter: Secondary | ICD-10-CM | POA: Diagnosis not present

## 2021-01-03 DIAGNOSIS — S61219A Laceration without foreign body of unspecified finger without damage to nail, initial encounter: Secondary | ICD-10-CM

## 2021-01-03 LAB — CBC WITH DIFFERENTIAL/PLATELET
Abs Immature Granulocytes: 0.02 10*3/uL (ref 0.00–0.07)
Basophils Absolute: 0 10*3/uL (ref 0.0–0.1)
Basophils Relative: 0 %
Eosinophils Absolute: 0.1 10*3/uL (ref 0.0–0.5)
Eosinophils Relative: 1 %
HCT: 43.8 % (ref 39.0–52.0)
Hemoglobin: 14.6 g/dL (ref 13.0–17.0)
Immature Granulocytes: 0 %
Lymphocytes Relative: 11 %
Lymphs Abs: 0.8 10*3/uL (ref 0.7–4.0)
MCH: 33.1 pg (ref 26.0–34.0)
MCHC: 33.3 g/dL (ref 30.0–36.0)
MCV: 99.3 fL (ref 80.0–100.0)
Monocytes Absolute: 0.7 10*3/uL (ref 0.1–1.0)
Monocytes Relative: 10 %
Neutro Abs: 5.2 10*3/uL (ref 1.7–7.7)
Neutrophils Relative %: 78 %
Platelets: 171 10*3/uL (ref 150–400)
RBC: 4.41 MIL/uL (ref 4.22–5.81)
RDW: 13.8 % (ref 11.5–15.5)
WBC: 6.8 10*3/uL (ref 4.0–10.5)
nRBC: 0 % (ref 0.0–0.2)

## 2021-01-03 LAB — BASIC METABOLIC PANEL
Anion gap: 11 (ref 5–15)
BUN: 5 mg/dL — ABNORMAL LOW (ref 8–23)
CO2: 24 mmol/L (ref 22–32)
Calcium: 8.4 mg/dL — ABNORMAL LOW (ref 8.9–10.3)
Chloride: 103 mmol/L (ref 98–111)
Creatinine, Ser: 0.79 mg/dL (ref 0.61–1.24)
GFR, Estimated: >60 mL/min (ref >60)
Glucose, Bld: 118 mg/dL — ABNORMAL HIGH (ref 70–99)
Potassium: 3.3 mmol/L — ABNORMAL LOW (ref 3.5–5.1)
Sodium: 138 mmol/L (ref 135–145)

## 2021-01-03 LAB — RESP PANEL BY RT-PCR (FLU A&B, COVID) ARPGX2
Influenza A by PCR: NEGATIVE
Influenza B by PCR: NEGATIVE
SARS Coronavirus 2 by RT PCR: NEGATIVE

## 2021-01-03 MED ORDER — CEFAZOLIN SODIUM-DEXTROSE 2-4 GM/100ML-% IV SOLN
2.0000 g | Freq: Once | INTRAVENOUS | Status: AC
  Administered 2021-01-03: 2 g via INTRAVENOUS
  Filled 2021-01-03: qty 100

## 2021-01-03 MED ORDER — LIDOCAINE-EPINEPHRINE (PF) 2 %-1:200000 IJ SOLN
20.0000 mL | Freq: Once | INTRAMUSCULAR | Status: AC
  Administered 2021-01-03: 20 mL
  Filled 2021-01-03: qty 20

## 2021-01-03 MED ORDER — HYDROMORPHONE HCL 1 MG/ML IJ SOLN
1.0000 mg | Freq: Once | INTRAMUSCULAR | Status: AC
  Administered 2021-01-03: 1 mg via INTRAVENOUS
  Filled 2021-01-03: qty 1

## 2021-01-03 MED ORDER — TETANUS-DIPHTH-ACELL PERTUSSIS 5-2.5-18.5 LF-MCG/0.5 IM SUSY
0.5000 mL | PREFILLED_SYRINGE | Freq: Once | INTRAMUSCULAR | Status: AC
  Administered 2021-01-03: 0.5 mL via INTRAMUSCULAR
  Filled 2021-01-03: qty 0.5

## 2021-01-03 MED ORDER — CEPHALEXIN 500 MG PO CAPS: 500.0000 mg | ORAL_CAPSULE | Freq: Three times a day (TID) | ORAL | 0 refills | Status: AC

## 2021-01-03 MED ORDER — HYDROCODONE-ACETAMINOPHEN 5-325 MG PO TABS: 1.0000 | ORAL_TABLET | Freq: Four times a day (QID) | ORAL | 0 refills | Status: AC | PRN

## 2021-01-03 NOTE — ED Provider Notes (Signed)
.  Nerve Block  Date/Time: 01/03/2021 6:04 PM Performed by: Gailen Shelter, PA Authorized by: Gailen Shelter, PA   Consent:    Consent obtained:  Verbal   Consent given by:  Patient   Risks discussed:  Unsuccessful block, nerve damage, infection, intravenous injection and pain   Alternatives discussed:  No treatment, delayed treatment and referral Indications:    Indications:  Pain relief and procedural anesthesia Location:    Body area:  Upper extremity   Upper extremity nerve:  Metacarpal   Laterality:  Left Pre-procedure details:    Skin preparation:  Alcohol Procedure details:    Block needle gauge:  25 G   Anesthetic injected:  Lidocaine 2% WITH epi   Steroid injected:  None   Additive injected:  None   Injection procedure:  Anatomic landmarks identified, incremental injection, negative aspiration for blood and anatomic landmarks palpated Post-procedure details:    Dressing:  Sterile dressing   Outcome:  Anesthesia achieved   Procedure completion:  Tolerated well, no immediate complications Comments:     Left middle finger digital block successful. Needle incrementally inserted. Anatomical landmarks used for guidance.  Completely numb finger after block (sensation intact prior to injection).     Gailen Shelter, [place] 01/03/21 1805    Koleen Distance, MD 01/03/21 [phone]

## 2021-01-03 NOTE — ED Triage Notes (Signed)
Pt by [facility], using table saw, "hit a nail," cut distal L pointer, middle, ring & pinky, states fingers are "numb." Lac above R eyebrow from fall after cutting fingers. Remembers incident. +ETOH, marijuana today fentanyl en route.

## 2021-01-03 NOTE — ED Provider Notes (Signed)
..  Laceration Repair  Date/Time: 01/03/2021 6:01 PM Performed by: Gailen Shelter, PA Authorized by: Gailen Shelter, PA   Consent:    Consent obtained:  Verbal   Consent given by:  Patient   Risks discussed:  Infection, need for additional repair, pain, poor cosmetic result and poor wound healing   Alternatives discussed:  No treatment and delayed treatment Universal protocol:    Procedure explained and questions answered to patient or proxy's satisfaction: yes     Relevant documents present and verified: yes     Test results available: yes     Imaging studies available: yes     Required blood products, implants, devices, and special equipment available: yes     Site/side marked: yes     Immediately prior to procedure, a time out was called: yes     Patient identity confirmed:  Verbally with patient Anesthesia:    Anesthesia method:  Local infiltration   Local anesthetic:  Lidocaine 2% WITH epi Laceration details:    Location:  Face   Face location:  R eyebrow   Length (cm):  3 Exploration:    Hemostasis achieved with:  Direct pressure   Wound extent: no foreign bodies/material noted and no tendon damage noted     Contaminated: no   Treatment:    Area cleansed with:  Saline   Amount of cleaning:  Standard   Irrigation solution:  Sterile saline   Irrigation volume:  100 cc   Irrigation method:  Pressure wash   Visualized foreign bodies/material removed: no   Skin repair:    Repair method:  Sutures   Suture size:  4-0   Suture material:  Prolene   Suture technique:  Simple interrupted   Number of sutures:  5 Approximation:    Approximation:  Close Repair type:    Repair type:  Intermediate Post-procedure details:    Dressing:  Antibiotic ointment and non-adherent dressing   Procedure completion:  Tolerated well, no immediate complications    Gailen Shelter, PA 01/03/21 1803    Koleen Distance, MD 01/03/21 1836

## 2021-01-03 NOTE — ED Provider Notes (Signed)
[facility] EMERGENCY DEPARTMENT Provider Note   CSN: [id number] Arrival date & time: 01/03/21  1529     History Chief Complaint  Patient presents with   Laceration    L pointer, middle, ring, pinky   Fall    Victor Long is a 60 y.o. male with PMHx Bipolar disorder who presents for evaluation of saw-related accident.   Patient states that he was cutting a piece of wood with a table-saw when he accidentally "hit a nail" and ran his left hand into the saw blade.  He sustained lacerations to the fingers of his left hand, as well as a laceration to his right eyebrow.  He is right-hand dominant.  He is primarily a Music therapist by trade.  He states his last tetanus was 6 years ago.  He admits to alcohol and marijuana use today.    Past Medical History:  Diagnosis Date   Back pain, chronic    Bipolar disorder (HCC)    Depression    Diverticulitis    GERD (gastroesophageal reflux disease)    Sciatica     Patient Active Problem List   Diagnosis Date Noted   Complete tear of left rotator cuff 02/23/2017   Tendinopathy of left biceps 02/23/2017   Arthritis of left acromioclavicular joint 02/23/2017   Bipolar affective disorder, current episode depressed (HCC) 07/01/2016   Bipolar disorder (HCC) 01/09/2016   Insomnia 01/09/2016   Diverticulitis 01/04/2016   BRBPR (bright red blood per rectum) 01/04/2016   ETOH abuse 01/04/2016    Past Surgical History:  Procedure Laterality Date   PILONIDAL CYST DRAINAGE     SHOULDER ARTHROSCOPY WITH ROTATOR CUFF REPAIR AND SUBACROMIAL DECOMPRESSION Left 03/03/2017   Procedure: LEFT SHOULDER ARTHROSCOPY WITH EXTENSIVE DEBRIDEMENT, ROTATOR CUFF REPAIR, SUBACROMIAL DECOMPRESSION, DISTAL CLAVICLE EXCISION;  Surgeon: Tarry Kos, MD;  Location: [facility];  Service: Orthopedics;  Laterality: Left;   SHOULDER SURGERY     cyst removal       Family History  Problem Relation Age of Onset   Cancer Father      Social History   Tobacco Use   Smoking status: Never   Smokeless tobacco: Current    Types: Chew  Substance Use Topics   Alcohol use: Yes    Alcohol/week: 6.0 - 12.0 standard drinks    Types: 6 - 12 Cans of beer per week    Comment: daily 6-7 beers   Drug use: Yes    Types: Marijuana    Comment: not for 28 years    Home Medications Prior to Admission medications   Medication Sig Start Date End Date Taking? Authorizing Provider  cephALEXin (KEFLEX) 500 MG capsule Take 1 capsule (500 mg total) by mouth 3 (three) times daily for 5 days. 01/03/21 01/08/21 Yes Holley Dexter, MD  HYDROcodone-acetaminophen (NORCO/VICODIN) 5-325 MG tablet Take 1 tablet by mouth every 6 (six) hours as needed for up to 5 days. 01/03/21 01/08/21 Yes Holley Dexter, MD  ciprofloxacin (CIPRO) 500 MG tablet Take 1 tablet (500 mg total) by mouth every 12 (twelve) hours. 10/23/20   Garlon Hatchet, PA-C  methocarbamol (ROBAXIN) 500 MG tablet Take 1 tablet (500 mg total) by mouth 2 (two) times daily as needed for muscle spasms. Patient not taking: Reported on 10/23/2020 07/21/20   Sponseller, Eugene Gavia, PA-C  metroNIDAZOLE (FLAGYL) 500 MG tablet Take 1 tablet (500 mg total) by mouth 2 (two) times daily. 10/23/20   Garlon Hatchet, PA-C  naproxen (NAPROSYN) 500  MG tablet Take 1 tablet (500 mg total) by mouth 2 (two) times daily. Patient not taking: Reported on 10/23/2020 04/08/20   Rhys Martini, PA-C  naproxen sodium (ALEVE) 220 MG tablet Take 660 mg by mouth daily as needed (pain).    [provider]  ondansetron (ZOFRAN ODT) 4 MG disintegrating tablet Take 1 tablet (4 mg total) by mouth every 8 (eight) hours as needed for nausea. 10/23/20   Garlon Hatchet, PA-C  albuterol (PROVENTIL HFA;VENTOLIN HFA) 108 (90 Base) MCG/ACT inhaler Inhale 2 puffs into the lungs every 4 (four) hours as needed for wheezing or shortness of breath. Patient not taking: Reported on 01/23/2020 02/24/18 04/08/20  Myrna Blazer, MD  promethazine (PHENERGAN) 25 MG tablet Take 1 tablet (25 mg total) by mouth every 6 (six) hours as needed for nausea. Patient not taking: Reported on 01/25/2019 03/03/17 04/08/20  Tarry Kos, MD    Allergies    Other and Mobic [meloxicam]  Review of Systems   Review of Systems  Constitutional:  Negative for chills and fever.  HENT:  Negative for ear pain and sore throat.   Eyes:  Negative for pain and visual disturbance.  Respiratory:  Negative for cough and shortness of breath.   Cardiovascular:  Negative for chest pain and palpitations.  Gastrointestinal:  Negative for abdominal pain and vomiting.  Genitourinary:  Negative for dysuria and hematuria.  Musculoskeletal:  Negative for arthralgias and back pain.  Skin:  Positive for wound. Negative for color change and rash.  Neurological:  Negative for seizures and syncope.  All other systems reviewed and are negative.  Physical Exam Updated Vital Signs BP 122/81   Pulse (!) 103   Temp 98.2 F (36.8 C) (Oral)   Resp 20   SpO2 97%   Physical Exam Vitals and nursing note reviewed.  Constitutional:      Appearance: He is well-developed.  HENT:     Head: Normocephalic.     Comments: 2.5 cm laceration to the right eyebrow. Eyes:     Conjunctiva/sclera: Conjunctivae normal.  Cardiovascular:     Rate and Rhythm: Normal rate and regular rhythm.     Heart sounds: No murmur heard. Pulmonary:     Effort: Pulmonary effort is normal. No respiratory distress.     Breath sounds: Normal breath sounds.  Abdominal:     Palpations: Abdomen is soft.     Tenderness: There is no abdominal tenderness.  Musculoskeletal:     Cervical back: Neck supple.     Comments: Multiple lacerations to the distal aspect of the second through fifth digits of the left hand.  Skin:    General: Skin is warm and dry.     Capillary Refill: Capillary refill takes less than 2 seconds.  Neurological:     General: No focal deficit present.      Mental Status: He is alert and oriented to person, place, and time. Mental status is at baseline.     ED Results / Procedures / Treatments   Labs (all labs ordered are listed, but only abnormal results are displayed) Labs Reviewed  BASIC METABOLIC PANEL - Abnormal; Notable for the following components:      Result Value   Potassium 3.3 (*)    Glucose, Bld 118 (*)    BUN 5 (*)    Calcium 8.4 (*)    All other components within normal limits  RESP PANEL BY RT-PCR (FLU A&B, COVID) ARPGX2  CBC WITH DIFFERENTIAL/PLATELET  EKG None  Radiology CT HEAD WO CONTRAST ( )  Result Date: 01/03/2021 CLINICAL DATA:  Facial trauma EXAM: CT HEAD WITHOUT CONTRAST TECHNIQUE: Contiguous axial images were obtained from the base of the skull through the vertex without intravenous contrast. COMPARISON:  12/18/2019 FINDINGS: Brain: No evidence of acute infarction, hemorrhage, hydrocephalus, extra-axial collection or mass lesion/mass effect. Vascular: No hyperdense vessel or unexpected calcification. Skull: Normal. Negative for fracture or focal lesion. Sinuses/Orbits: No acute finding. Other: Right supraorbital soft tissue swelling and laceration. No radiopaque foreign body is seen within the soft tissues. IMPRESSION: 1. No acute intracranial findings. 2. Right supraorbital soft tissue swelling and laceration. No radiopaque foreign body is seen within the soft tissues. Electronically Signed   By: Duanne Guess D.O.   On: 01/03/2021 16:56   DG Hand Complete Left  Result Date: 01/03/2021 CLINICAL DATA:  Laceration EXAM: LEFT HAND - COMPLETE 3+ VIEW COMPARISON:  None. FINDINGS: No radiopaque foreign body is seen. Acute comminuted fracture involving the tuft of the fourth distal phalanx. Possible punctate osseous fragments adjacent to the third distal phalanx. Arthritis at the first [facility] joint and IP joints. Questionable fracture deformity at the tuft of the fifth distal phalanx. IMPRESSION: 1. Acute comminuted  tuft fracture fourth distal phalanx. Suspected small osseous/fracture fragments adjacent to the third distal phalanx. Possible nondisplaced fracture at the tuft of fifth distal phalanx Electronically Signed   By: Jasmine Pang M.D.   On: 01/03/2021 16:23    Procedures .Marland KitchenLaceration Repair  Date/Time: 01/03/2021 6:41 PM Performed by: Holley Dexter, MD Authorized by: Charlynne Pander, MD   Consent:    Consent obtained:  Verbal   Consent given by:  Patient   Risks, benefits, and alternatives were discussed: yes     Risks discussed:  Infection, pain, retained foreign body, need for additional repair, poor cosmetic result, tendon damage, nerve damage, poor wound healing and vascular damage   Alternatives discussed:  No treatment Universal protocol:    Procedure explained and questions answered to patient or proxy's satisfaction: yes     Relevant documents present and verified: yes     Test results available: yes     Imaging studies available: yes     Required blood products, implants, devices, and special equipment available: yes     Site/side marked: yes     Immediately prior to procedure, a time out was called: yes     Patient identity confirmed:  Verbally with patient, hospital-assigned identification number and arm band Anesthesia:    Anesthesia method:  Local infiltration   Local anesthetic:  Lidocaine 2% WITH epi Laceration details:    Location:  Finger   Finger location: Left index, middle, and ring fingers.   Wound length (cm): Approximately 7 cm total.   Depth (mm):  5 Pre-procedure details:    Preparation:  Patient was prepped and draped in usual sterile fashion and imaging obtained to evaluate for foreign bodies Exploration:    Limited defect created (wound extended): no     Hemostasis achieved with:  Direct pressure   Imaging obtained: x-ray     Imaging outcome: foreign body not noted     Wound exploration: wound explored through full range of motion and entire depth of  wound visualized     Wound extent: underlying fracture     Contaminated: no   Treatment:    Area cleansed with:  Saline   Amount of cleaning:  Extensive   Irrigation solution:  Sterile saline   Irrigation  volume:  1L   Irrigation method:  Pressure wash and syringe   Visualized foreign bodies/material removed: yes     Debridement:  Minimal   Undermining:  None   Scar revision: no   Skin repair:    Repair method:  Sutures   Suture size:  5-0   Suture material:  Prolene   Suture technique:  Simple interrupted   Number of sutures:  21 Approximation:    Approximation:  Close Repair type:    Repair type:  Intermediate Post-procedure details:    Dressing:  Antibiotic ointment and non-adherent dressing   Procedure completion:  Tolerated well, no immediate complications   Medications Ordered in ED Medications  HYDROmorphone (DILAUDID) injection 1 mg (1 mg Intravenous Given 01/03/21 1540)  Tdap (BOOSTRIX) injection 0.5 mL (0.5 mLs Intramuscular Given 01/03/21 1540)  ceFAZolin (ANCEF) IVPB 2g/100 mL premix (0 g Intravenous Stopped 01/03/21 1618)  lidocaine-EPINEPHrine (XYLOCAINE W/EPI) 2 %-1:200000 (PF) injection 20 mL (20 mLs Infiltration Given 01/03/21 1635)    ED Course  I have reviewed the triage vital signs and the nursing notes.  Pertinent labs & imaging results that were available during my care of the patient were reviewed by me and considered in my medical decision making (see chart for details).    MDM Rules/Calculators/A&P                           60 y.o. male with past medical history as above who presents for evaluation of injuries sustained in a saw accident.  He has a laceration to his right eyebrow, as well as multiple lacerations to the fingers of his left hand.  He is right-hand dominant.  Afebrile and hemodynamically stable.  Exam as detailed above.  CBC without anemia.  BMP with mild hypokalemia with potassium 3.3.  CT head without acute intracranial abnormality.   X-ray of the left hand shows an acute tuft fracture of the fourth distal phalanx, as well as a nondisplaced tuft fracture of the fifth distal phalanx.  Possibly some small bony fractures of the third phalanx as well. Case was discussed with hand specialist on-call, who recommends copious irrigation as well as closure at bedside.  They will plan to follow-up with the patient early next week.  Patient was given Ancef and Tdap.  Wound repair performed as documented above.  Technically, this was a very difficult closure due to multiple soft tissue defects from the sawblade.  While I was able to well approximate some of the wounds, other wounds necessitated loose closure due to these soft tissue defects.  Patient will dress these partially open wounds as directed.  Poor healing, poor cosmetic effect, and likely need for additional repair was discussed with the patient, who voiced his understanding.  Patient was discharged with a course of Keflex, as well as Norco for breakthrough pain.  He will follow-up with his PCP, as well as hand specialist next week.  Final Clinical Impression(s) / ED Diagnoses Final diagnoses:  Contact with powered saw as cause of accidental injury  Laceration of finger of left hand, foreign body presence unspecified, nail damage status unspecified, unspecified finger, initial encounter    Rx / DC Orders ED Discharge Orders          Ordered    cephALEXin (KEFLEX) 500 MG capsule  3 times daily        01/03/21 1836    HYDROcodone-acetaminophen (NORCO/VICODIN) 5-325 MG tablet  Every 6 hours  PRN        01/03/21 1836             Holley Dexter, MD 01/04/21 1202    Charlynne Pander, MD 01/05/21 [phone]

## 2021-01-03 NOTE — Consult Note (Signed)
Reason for Consult:Left hand injury Referring Physician: Chaney Malling Time called: 1537 Time at bedside: 1539   Victor Long is an 60 y.o. male.  HPI: Victor Long was working with a table saw and got his hand caught in the blade. It cut the tips of most of his left fingers. He came to the ED for evaluation and hand surgery was consulted. 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  Past Medical History:  Diagnosis Date   Back pain, chronic    Bipolar disorder (HCC)    Depression    Diverticulitis    GERD (gastroesophageal reflux disease)    Sciatica     Past Surgical History:  Procedure Laterality Date   PILONIDAL CYST DRAINAGE     SHOULDER ARTHROSCOPY WITH ROTATOR CUFF REPAIR AND SUBACROMIAL DECOMPRESSION Left 03/03/2017   Procedure: LEFT SHOULDER ARTHROSCOPY WITH EXTENSIVE DEBRIDEMENT, ROTATOR CUFF REPAIR, SUBACROMIAL DECOMPRESSION, DISTAL CLAVICLE EXCISION;  Surgeon: Tarry Kos, MD;  Location: [facility];  Service: Orthopedics;  Laterality: Left;   SHOULDER SURGERY     cyst removal    Family History  Problem Relation Age of Onset   Cancer Father     Social History:  reports that he has never smoked. His smokeless tobacco use includes chew. He reports current alcohol use of about 6.0 - 12.0 standard drinks per week. He reports current drug use. Drug: Marijuana.  Allergies:  Allergies  Allergen Reactions   Other Other (See Comments)    Hair dye-chemical burn   Mobic [Meloxicam]     Medications: I have reviewed the patient's current medications.  No results found for this or any previous visit (from the past 48 hour(s)).  No results found.  Review of Systems  HENT:  Negative for ear discharge, ear pain, hearing loss and tinnitus.   Eyes:  Negative for photophobia and pain.  Respiratory:  Negative for cough and shortness of breath.   Cardiovascular:  Negative for chest pain.  Gastrointestinal:  Negative for abdominal pain, nausea and  vomiting.  Genitourinary:  Negative for dysuria, flank pain, frequency and urgency.  Musculoskeletal:  Positive for arthralgias (Left hand). Negative for back pain, myalgias and neck pain.  Neurological:  Negative for dizziness and headaches.  Hematological:  Does not bruise/bleed easily.  Psychiatric/Behavioral:  The patient is not nervous/anxious.   Blood pressure 112/78, pulse (!) 55, temperature 97.7 F (36.5 C), temperature source Oral, resp. rate 13, SpO2 97 %. Physical Exam Constitutional:      General: He is not in acute distress.    Appearance: He is well-developed. He is not diaphoretic.  HENT:     Head: Normocephalic and atraumatic.  Eyes:     General: No scleral icterus.       Right eye: No discharge.        Left eye: No discharge.     Conjunctiva/sclera: Conjunctivae normal.  Cardiovascular:     Rate and Rhythm: Normal rate and regular rhythm.  Pulmonary:     Effort: Pulmonary effort is normal. No respiratory distress.  Musculoskeletal:     Cervical back: Normal range of motion.     Comments: Left shoulder, elbow, wrist, digits- laceration to tips of fingers 2-5, long and little finger tips numb, no instability, no blocks to motion  Sens  Ax/R/M/U intact except as above  Mot   Ax/ R/ PIN/ M/ AIN/ U intact  Rad 2+  Skin:    General: Skin is warm and dry.  Neurological:     Mental Status: He is alert.  Psychiatric:        Mood and Affect: Mood normal.        Behavior: Behavior normal.    Assessment/Plan: Left hand injury -- EDP to I&D and close. F/u with Dr. Arita Miss next week.    Freeman Caldron, PA-C Orthopedic Surgery [phone] 01/03/2021, 3:59 PM

## 2021-01-10 ENCOUNTER — Ambulatory Visit: Payer: Self-pay | Admitting: *Deleted

## 2021-01-10 NOTE — Telephone Encounter (Signed)
Patient has an appointment with Merry Proud on 01/16/2021.  Scheduled a f/u apt at [facility] on 02/07/2021 @ 0900.    Patient verbalized understanding.

## 2021-01-10 NOTE — Telephone Encounter (Signed)
Patient was advised per AVS to contact [facility] for suture removal- patient is needing appointment- but is not established with office. Attempted to call office- no answer- call sent for review and follow up scheduling/recommendations.    Reason for Disposition  Suture (or staple) removal date, questions about  Answer Assessment - Initial Assessment Questions 1. LOCATION: "Where are the sutures (or staples) located?"      26- fingers  2. NUMBER: "How many sutures (or staples) are there?"      26 3. DATE IN: "When were the sutures (or staples) put in?"       9/23 4. DATE OUT: "When did your doctor tell you the sutures (or staples) needed to come out?"     unsure 5. OTHER SYMPTOMS: "Do you have any other symptoms?" (e.g., wound pain, discharge, fever?)     Patient states his fingers are healing well- but the middle finger did not have sufficient skin to closed and he does have open area there- plastic surgeon was recommended and he will contact them to get timeline for follow up.  Protocols used: Suture or Staple Questions-A-AH

## 2021-01-10 NOTE — Telephone Encounter (Signed)
Pt is calling because he was advised to call [facility] to have stiches removed. [facility] is not accepted NP. Pt would like to know how look he should keep the stiches.   Attempted to call patient- left message to call back on VM

## 2021-01-16 ENCOUNTER — Encounter (HOSPITAL_COMMUNITY): Payer: Self-pay | Admitting: Emergency Medicine

## 2021-01-16 ENCOUNTER — Ambulatory Visit (HOSPITAL_COMMUNITY)
Admission: EM | Admit: 2021-01-16 | Discharge: 2021-01-16 | Disposition: A | Discharge status: Home / Self Care | Payer: Medicaid Other | Attending: Physician Assistant | Admitting: Physician Assistant

## 2021-01-16 ENCOUNTER — Institutional Professional Consult (permissible substitution): Payer: Medicaid Other | Admitting: Plastic Surgery

## 2021-01-16 ENCOUNTER — Other Ambulatory Visit: Payer: Self-pay

## 2021-01-16 DIAGNOSIS — T8130XA Disruption of wound, unspecified, initial encounter: Secondary | ICD-10-CM

## 2021-01-16 MED ORDER — DOXYCYCLINE HYCLATE 100 MG PO CAPS: 100.0000 mg | ORAL_CAPSULE | Freq: Two times a day (BID) | ORAL | 0 refills | Status: AC

## 2021-01-16 NOTE — ED Provider Notes (Signed)
[facility]    CSN: [id number] Arrival date & time: 01/16/21  1124      History   Chief Complaint Chief Complaint  Patient presents with   Wound Check    HPI Victor Long is a 60 y.o. male.   Patient here today for evaluation of his wounds to left hand sustained after an injury involving a saw on September 23.  He had significant damage, and complicated suture placement, and is concerned today because he has noticed to his third finger some of the stitches have separated and he has had some purulent drainage.  He has not had fever.  He was supposed to see plastic surgeon today, but was unable to see him.  His next appointment is not scheduled until 19 October.   Wound Check Pertinent negatives include no shortness of breath.   Past Medical History:  Diagnosis Date   Back pain, chronic    Bipolar disorder (HCC)    Depression    Diverticulitis    GERD (gastroesophageal reflux disease)    Sciatica     Patient Active Problem List   Diagnosis Date Noted   Complete tear of left rotator cuff 02/23/2017   Tendinopathy of left biceps 02/23/2017   Arthritis of left acromioclavicular joint 02/23/2017   Bipolar affective disorder, current episode depressed (HCC) 07/01/2016   Bipolar disorder (HCC) 01/09/2016   Insomnia 01/09/2016   Diverticulitis 01/04/2016   BRBPR (bright red blood per rectum) 01/04/2016   ETOH abuse 01/04/2016    Past Surgical History:  Procedure Laterality Date   PILONIDAL CYST DRAINAGE     SHOULDER ARTHROSCOPY WITH ROTATOR CUFF REPAIR AND SUBACROMIAL DECOMPRESSION Left 03/03/2017   Procedure: LEFT SHOULDER ARTHROSCOPY WITH EXTENSIVE DEBRIDEMENT, ROTATOR CUFF REPAIR, SUBACROMIAL DECOMPRESSION, DISTAL CLAVICLE EXCISION;  Surgeon: Tarry Kos, MD;  Location: [facility];  Service: Orthopedics;  Laterality: Left;   SHOULDER SURGERY     cyst removal       Home Medications    Prior to Admission medications   Medication  Sig Start Date End Date Taking? Authorizing Provider  doxycycline (VIBRAMYCIN) 100 MG capsule Take 1 capsule (100 mg total) by mouth 2 (two) times daily. 01/16/21  Yes Tomi Bamberger, PA-C  ciprofloxacin (CIPRO) 500 MG tablet Take 1 tablet (500 mg total) by mouth every 12 (twelve) hours. 10/23/20   Garlon Hatchet, PA-C  methocarbamol (ROBAXIN) 500 MG tablet Take 1 tablet (500 mg total) by mouth 2 (two) times daily as needed for muscle spasms. Patient not taking: Reported on 10/23/2020 07/21/20   Sponseller, Eugene Gavia, PA-C  metroNIDAZOLE (FLAGYL) 500 MG tablet Take 1 tablet (500 mg total) by mouth 2 (two) times daily. 10/23/20   Garlon Hatchet, PA-C  naproxen (NAPROSYN) 500 MG tablet Take 1 tablet (500 mg total) by mouth 2 (two) times daily. Patient not taking: Reported on 10/23/2020 04/08/20   Rhys Martini, PA-C  naproxen sodium (ALEVE) 220 MG tablet Take 660 mg by mouth daily as needed (pain).    [provider]  ondansetron (ZOFRAN ODT) 4 MG disintegrating tablet Take 1 tablet (4 mg total) by mouth every 8 (eight) hours as needed for nausea. 10/23/20   Garlon Hatchet, PA-C  albuterol (PROVENTIL HFA;VENTOLIN HFA) 108 (90 Base) MCG/ACT inhaler Inhale 2 puffs into the lungs every 4 (four) hours as needed for wheezing or shortness of breath. Patient not taking: Reported on 01/23/2020 02/24/18 04/08/20  Myrna Blazer, MD  promethazine [facility]) 25  MG tablet Take 1 tablet (25 mg total) by mouth every 6 (six) hours as needed for nausea. Patient not taking: Reported on 01/25/2019 03/03/17 04/08/20  Tarry Kos, MD    Family History Family History  Problem Relation Age of Onset   Cancer Father     Social History Social History   Tobacco Use   Smoking status: Never   Smokeless tobacco: Current    Types: Chew  Substance Use Topics   Alcohol use: Yes    Alcohol/week: 6.0 - 12.0 standard drinks    Types: 6 - 12 Cans of beer per week    Comment: daily 6-7 beers   Drug  use: Yes    Types: Marijuana    Comment: not for 28 years     Allergies   Other and Mobic [meloxicam]   Review of Systems Review of Systems  Constitutional:  Negative for chills and fever.  Eyes:  Negative for discharge and redness.  Respiratory:  Negative for shortness of breath.   Skin:  Positive for color change and wound.    Physical Exam Triage Vital Signs ED Triage Vitals  Enc Vitals Group     BP 01/16/21 1139 (!) 117/91     Pulse Rate 01/16/21 1139 99     Resp 01/16/21 1139 17     Temp 01/16/21 1139 98.6 F (37 C)     Temp Source 01/16/21 1139 Oral     SpO2 01/16/21 1139 96 %     Weight --      Height --      Head Circumference --      Peak Flow --      Pain Score 01/16/21 1138 7     Pain Loc --      Pain Edu? --      Excl. in GC? --    No data found.  Updated Vital Signs BP (!) 117/91 (BP Location: Right Arm)   Pulse 99   Temp 98.6 F (37 C) (Oral)   Resp 17   SpO2 96%       Physical Exam Vitals and nursing note reviewed.  Constitutional:      General: He is not in acute distress.    Appearance: Normal appearance. He is not ill-appearing.  HENT:     Head: Normocephalic and atraumatic.  Eyes:     Conjunctiva/sclera: Conjunctivae normal.  Cardiovascular:     Rate and Rhythm: Normal rate.  Pulmonary:     Effort: Pulmonary effort is normal.  Skin:    Comments: See photos  Neurological:     Mental Status: He is alert.  Psychiatric:        Mood and Affect: Mood normal.        Behavior: Behavior normal.         UC Treatments / Results  Labs (all labs ordered are listed, but only abnormal results are displayed) Labs Reviewed - No data to display  EKG   Radiology No results found.  Procedures Procedures (including critical care time)  Medications Ordered in UC Medications - No data to display  Initial Impression / Assessment and Plan / UC Course  I have reviewed the triage vital signs and the nursing notes.  Pertinent  labs & imaging results that were available during my care of the patient were reviewed by me and considered in my medical decision making (see chart for details).  There appears to be separation to skin approximation of the third finger, discussed  that I did not feel comfortable tapering with sutures, but that I would reach out to plastic surgeon and I did send an antibiotic for coverage of possible infection.  Encouraged sooner follow-up with any further concerns.  Final Clinical Impressions(s) / UC Diagnoses   Final diagnoses:  Wound dehiscence     Discharge Instructions      Take antibiotic as prescribed. Follow up with any further concerns.      ED Prescriptions     Medication Sig Dispense Auth. Provider   doxycycline (VIBRAMYCIN) 100 MG capsule Take 1 capsule (100 mg total) by mouth 2 (two) times daily. 20 capsule Tomi Bamberger, PA-C      PDMP not reviewed this encounter.   Tomi Bamberger, PA-C 01/16/21 1323

## 2021-01-16 NOTE — ED Triage Notes (Signed)
Pt reports that he had left hand saw injury 9/23. Here to get rechecked due to stitches coming out and has a gap. Reports has pus and blood drainage. Went to [facility] and wasn't able to be seen.

## 2021-01-16 NOTE — Discharge Instructions (Addendum)
Take antibiotic as prescribed. Follow up with any further concerns.  

## 2021-01-29 ENCOUNTER — Institutional Professional Consult (permissible substitution): Payer: Medicaid Other | Admitting: Plastic Surgery

## 2021-02-07 ENCOUNTER — Ambulatory Visit: Payer: Medicaid Other | Admitting: Family Medicine

## 2024-04-04 ENCOUNTER — Ambulatory Visit: Payer: MEDICAID | Admitting: Orthopaedic Surgery

## 2024-04-25 ENCOUNTER — Encounter: Payer: Self-pay | Admitting: Orthopaedic Surgery

## 2024-04-25 ENCOUNTER — Other Ambulatory Visit: Payer: MEDICAID

## 2024-04-25 ENCOUNTER — Ambulatory Visit: Payer: MEDICAID | Admitting: Orthopaedic Surgery

## 2024-04-25 DIAGNOSIS — M1712 Unilateral primary osteoarthritis, left knee: Secondary | ICD-10-CM | POA: Insufficient documentation

## 2024-04-25 DIAGNOSIS — G8929 Other chronic pain: Secondary | ICD-10-CM | POA: Diagnosis not present

## 2024-04-25 DIAGNOSIS — M25562 Pain in left knee: Secondary | ICD-10-CM | POA: Diagnosis not present

## 2024-04-25 MED ORDER — METHYLPREDNISOLONE ACETATE 40 MG/ML IJ SUSP
40.0000 mg | INTRAMUSCULAR | Status: AC | PRN
  Administered 2024-04-25: 40 mg via INTRA_ARTICULAR

## 2024-04-25 MED ORDER — LIDOCAINE HCL 1 % IJ SOLN
2.0000 mL | INTRAMUSCULAR | Status: AC | PRN
  Administered 2024-04-25: 2 mL

## 2024-04-25 MED ORDER — BUPIVACAINE HCL 0.5 % IJ SOLN
2.0000 mL | INTRAMUSCULAR | Status: AC | PRN
  Administered 2024-04-25: 2 mL via INTRA_ARTICULAR

## 2024-04-25 NOTE — Progress Notes (Signed)
 "  Office Visit Note   Patient: Victor Long           Date of Birth: September 18, 1960           MRN: [id number] Visit Date: 04/25/2024              Requested by: Orlando Dwayne NOVAK, FNP [address] [place],  [place] [address] PCP: Orlando Dwayne NOVAK, FNP   Assessment & Plan: Visit Diagnoses:  1. Primary osteoarthritis of left knee     Plan: History of Present Illness Victor Long is a 64 year old male with advanced osteoarthritis of the left knee who presents for evaluation of chronic, progressively worsening left knee pain.  He has had progressive left knee pain for 5 years, aggravated by activity and fluctuating in intensity. Pain is associated with popping, swelling, and episodes of instability with the knee giving way.  Pain is influenced by weather and can disrupt sleep. It is currently minimal at rest but increases with twisting movements or missteps. He recently tripped on a concealed 4-inch block, catching his heel, which may have worsened his symptoms.  He denies constant pain and notes that symptoms fluctuate. He has not received a cortisone injection in the left knee and currently uses a knee brace at work.  Physical Exam MUSCULOSKELETAL: Varus deformity of left knee. Tenderness along medial joint line of left knee. No joint effusion in left knee. Pain with range of motion in left knee.  Assessment and Plan Advanced osteoarthritis of the left knee with varus deformity Advanced osteoarthritis with bone-on-bone changes in medial and patellofemoral compartments causing chronic pain and functional limitations. Nonoperative management offers temporary relief; total knee arthroplasty likely needed for long-term improvement. - Recommended corticosteroid injection to the left knee for symptomatic relief. - Recommended Voltaren  gel for additional pain management. - Advised continued use of knee brace during work activities. - Discussed oral NSAIDs (e.g., celecoxib, naproxen ,  ibuprofen ) as needed for pain control. - Reviewed total knee arthroplasty as a definitive solution if conservative measures fail, including education on recovery and postoperative support.  Follow-Up Instructions: No follow-ups on file.   Orders:  Orders Placed This Encounter  Procedures   XR KNEE 3 VIEW LEFT   No orders of the defined types were placed in this encounter.     Procedures: Large Joint Inj: L knee on 04/25/2024 5:53 PM Details: 22 G needle Medications: 2 mL bupivacaine  0.5 %; 2 mL lidocaine  1 %; 40 mg methylPREDNISolone  acetate 40 MG/ML Outcome: tolerated well, no immediate complications Patient was prepped and draped in the usual sterile fashion.       Clinical Data: No additional findings.   Subjective: Chief Complaint  Patient presents with   Left Knee - Pain    HPI  Review of Systems  Constitutional: Negative.   HENT: Negative.    Eyes: Negative.   Respiratory: Negative.    Cardiovascular: Negative.   Gastrointestinal: Negative.   Endocrine: Negative.   Genitourinary: Negative.   Skin: Negative.   Allergic/Immunologic: Negative.   Neurological: Negative.   Hematological: Negative.   Psychiatric/Behavioral: Negative.    All other systems reviewed and are negative.    Objective: Vital Signs: There were no vitals taken for this visit.  Physical Exam Vitals and nursing note reviewed.  Constitutional:      Appearance: He is well-developed.  HENT:     Head: Normocephalic and atraumatic.  Eyes:     Pupils: Pupils are equal, round, and reactive to light.  Pulmonary:     Effort: Pulmonary effort is normal.  Abdominal:     Palpations: Abdomen is soft.  Musculoskeletal:        General: Normal range of motion.     Cervical back: Neck supple.  Skin:    General: Skin is warm.  Neurological:     Mental Status: He is alert and oriented to person, place, and time.  Psychiatric:        Behavior: Behavior normal.        Thought Content:  Thought content normal.        Judgment: Judgment normal.     Ortho Exam  Specialty Comments:  No specialty comments available.  Imaging: XR KNEE 3 VIEW LEFT Result Date: 04/25/2024 X-rays of the left knee show advanced tricompartmental osteoarthritis.  Bone-on-bone joint space narrowing of medial compartment.  Kellgren-Lawrence stage IV.  Varus deformity.    PMFS History: Patient Active Problem List   Diagnosis Date Noted   Primary osteoarthritis of left knee 04/25/2024   Complete tear of left rotator cuff 02/23/2017   Tendinopathy of left biceps 02/23/2017   Arthritis of left acromioclavicular joint 02/23/2017   Bipolar affective disorder, current episode depressed (HCC) 07/01/2016   Bipolar disorder (HCC) 01/09/2016   Insomnia 01/09/2016   Diverticulitis 01/04/2016   BRBPR (bright red blood per rectum) 01/04/2016   ETOH abuse 01/04/2016   Past Medical History:  Diagnosis Date   Back pain, chronic    Bipolar disorder (HCC)    Depression    Diverticulitis    GERD (gastroesophageal reflux disease)    Sciatica     Family History  Problem Relation Age of Onset   Cancer Father     Past Surgical History:  Procedure Laterality Date   PILONIDAL CYST DRAINAGE     SHOULDER ARTHROSCOPY WITH ROTATOR CUFF REPAIR AND SUBACROMIAL DECOMPRESSION Left 03/03/2017   Procedure: LEFT SHOULDER ARTHROSCOPY WITH EXTENSIVE DEBRIDEMENT, ROTATOR CUFF REPAIR, SUBACROMIAL DECOMPRESSION, DISTAL CLAVICLE EXCISION;  Surgeon: Jerri Kay HERO, MD;  Location: [facility];  Service: Orthopedics;  Laterality: Left;   SHOULDER SURGERY     cyst removal   Social History   Occupational History   Not on file  Tobacco Use   Smoking status: Never   Smokeless tobacco: Current    Types: Chew  Substance and Sexual Activity   Alcohol use: Yes    Alcohol/week: 6.0 - 12.0 standard drinks of alcohol    Types: 6 - 12 Cans of beer per week    Comment: daily 6-7 beers   Drug use: Yes     Types: Marijuana    Comment: not for 28 years   Sexual activity: Not on file        "

## 2024-04-28 ENCOUNTER — Encounter: Payer: Self-pay | Admitting: General Surgery

## 2024-05-04 ENCOUNTER — Other Ambulatory Visit: Payer: Self-pay | Admitting: General Surgery

## 2024-05-04 DIAGNOSIS — N63 Unspecified lump in unspecified breast: Secondary | ICD-10-CM
